# Patient Record
Sex: Male | Born: 1955
Health system: Southern US, Community
[De-identification: ages and names within clinical notes are randomized; demographics above are authoritative.]

## PROBLEM LIST (undated history)

## (undated) DIAGNOSIS — F32A Depression, unspecified: Secondary | ICD-10-CM

## (undated) DIAGNOSIS — Z87442 Personal history of urinary calculi: Secondary | ICD-10-CM

## (undated) DIAGNOSIS — I519 Heart disease, unspecified: Secondary | ICD-10-CM

## (undated) DIAGNOSIS — G250 Essential tremor: Secondary | ICD-10-CM

## (undated) DIAGNOSIS — E119 Type 2 diabetes mellitus without complications: Secondary | ICD-10-CM

## (undated) DIAGNOSIS — I10 Essential (primary) hypertension: Secondary | ICD-10-CM

## (undated) DIAGNOSIS — Z8619 Personal history of other infectious and parasitic diseases: Secondary | ICD-10-CM

## (undated) DIAGNOSIS — G709 Myoneural disorder, unspecified: Secondary | ICD-10-CM

## (undated) DIAGNOSIS — F419 Anxiety disorder, unspecified: Secondary | ICD-10-CM

## (undated) DIAGNOSIS — H359 Unspecified retinal disorder: Secondary | ICD-10-CM

## (undated) DIAGNOSIS — N189 Chronic kidney disease, unspecified: Secondary | ICD-10-CM

## (undated) DIAGNOSIS — H269 Unspecified cataract: Secondary | ICD-10-CM

## (undated) DIAGNOSIS — S83206A Unspecified tear of unspecified meniscus, current injury, right knee, initial encounter: Secondary | ICD-10-CM

## (undated) DIAGNOSIS — K5790 Diverticulosis of intestine, part unspecified, without perforation or abscess without bleeding: Secondary | ICD-10-CM

## (undated) DIAGNOSIS — E785 Hyperlipidemia, unspecified: Secondary | ICD-10-CM

## (undated) DIAGNOSIS — F329 Major depressive disorder, single episode, unspecified: Secondary | ICD-10-CM

## (undated) DIAGNOSIS — K219 Gastro-esophageal reflux disease without esophagitis: Secondary | ICD-10-CM

## (undated) DIAGNOSIS — D649 Anemia, unspecified: Secondary | ICD-10-CM

## (undated) DIAGNOSIS — M1990 Unspecified osteoarthritis, unspecified site: Secondary | ICD-10-CM

## (undated) HISTORY — PX: TONSILLECTOMY: SUR1361

## (undated) HISTORY — PX: KNEE ARTHROSCOPY W/ DEBRIDEMENT: SHX1867

## (undated) HISTORY — DX: Unspecified cataract: H26.9

## (undated) HISTORY — DX: Major depressive disorder, single episode, unspecified: F32.9

## (undated) HISTORY — DX: Myoneural disorder, unspecified: G70.9

## (undated) HISTORY — DX: Depression, unspecified: F32.A

## (undated) HISTORY — DX: Anxiety disorder, unspecified: F41.9

## (undated) HISTORY — DX: Chronic kidney disease, unspecified: N18.9

## (undated) HISTORY — DX: Gastro-esophageal reflux disease without esophagitis: K21.9

## (undated) HISTORY — PX: CARDIAC CATHETERIZATION: SHX172

## (undated) HISTORY — PX: SHOULDER ARTHROSCOPY: SHX128

## (undated) HISTORY — PX: CATARACT EXTRACTION, BILATERAL: SHX1313

## (undated) HISTORY — DX: Anemia, unspecified: D64.9

---

## 1991-12-27 HISTORY — PX: VASECTOMY REVERSAL: SHX243

## 2004-04-17 ENCOUNTER — Encounter: Payer: Self-pay | Admitting: Internal Medicine

## 2004-11-02 ENCOUNTER — Ambulatory Visit: Payer: Self-pay | Admitting: Internal Medicine

## 2004-11-09 ENCOUNTER — Ambulatory Visit: Payer: Self-pay | Admitting: Internal Medicine

## 2005-03-01 ENCOUNTER — Ambulatory Visit: Payer: Self-pay | Admitting: Internal Medicine

## 2005-03-04 ENCOUNTER — Ambulatory Visit: Payer: Self-pay | Admitting: Internal Medicine

## 2005-06-15 ENCOUNTER — Ambulatory Visit: Payer: Self-pay | Admitting: Internal Medicine

## 2006-03-06 ENCOUNTER — Ambulatory Visit: Payer: Self-pay | Admitting: Internal Medicine

## 2006-03-13 ENCOUNTER — Ambulatory Visit: Payer: Self-pay | Admitting: Internal Medicine

## 2006-04-25 ENCOUNTER — Ambulatory Visit (HOSPITAL_COMMUNITY): Admission: RE | Admit: 2006-04-25 | Discharge: 2006-04-25 | Payer: Self-pay | Admitting: Orthopedic Surgery

## 2006-05-08 ENCOUNTER — Ambulatory Visit: Payer: Self-pay | Admitting: Internal Medicine

## 2006-07-12 ENCOUNTER — Ambulatory Visit: Payer: Self-pay | Admitting: Internal Medicine

## 2006-11-09 ENCOUNTER — Ambulatory Visit: Payer: Self-pay | Admitting: Internal Medicine

## 2007-03-07 ENCOUNTER — Ambulatory Visit: Payer: Self-pay | Admitting: Internal Medicine

## 2007-03-07 LAB — CONVERTED CEMR LAB
AST: 30 units/L (ref 0–37)
Albumin: 3.5 g/dL (ref 3.5–5.2)
Basophils Relative: 0.2 % (ref 0.0–1.0)
CO2: 29 meq/L (ref 19–32)
Chloride: 103 meq/L (ref 96–112)
Creatinine, Ser: 1 mg/dL (ref 0.4–1.5)
Creatinine,U: 377.4 mg/dL
Eosinophils Relative: 1.8 % (ref 0.0–5.0)
Glucose, Bld: 140 mg/dL — ABNORMAL HIGH (ref 70–99)
HCT: 40.6 % (ref 39.0–52.0)
Hemoglobin: 13.6 g/dL (ref 13.0–17.0)
Monocytes Absolute: 0.6 10*3/uL (ref 0.2–0.7)
Neutrophils Relative %: 52.7 % (ref 43.0–77.0)
PSA: 0.45 ng/mL (ref 0.10–4.00)
RBC: 4.96 M/uL (ref 4.22–5.81)
RDW: 14 % (ref 11.5–14.6)
Sodium: 141 meq/L (ref 135–145)
Total Bilirubin: 1.3 mg/dL — ABNORMAL HIGH (ref 0.3–1.2)
Total CHOL/HDL Ratio: 5.6
Total Protein: 7.1 g/dL (ref 6.0–8.3)
Triglycerides: 176 mg/dL — ABNORMAL HIGH (ref 0–149)
WBC: 6.5 10*3/uL (ref 4.5–10.5)

## 2007-03-14 ENCOUNTER — Ambulatory Visit: Payer: Self-pay | Admitting: Internal Medicine

## 2007-05-11 DIAGNOSIS — E785 Hyperlipidemia, unspecified: Secondary | ICD-10-CM

## 2007-05-11 DIAGNOSIS — E119 Type 2 diabetes mellitus without complications: Secondary | ICD-10-CM | POA: Insufficient documentation

## 2007-05-11 DIAGNOSIS — E1169 Type 2 diabetes mellitus with other specified complication: Secondary | ICD-10-CM | POA: Insufficient documentation

## 2007-05-16 ENCOUNTER — Ambulatory Visit: Payer: Self-pay | Admitting: Internal Medicine

## 2007-05-16 LAB — CONVERTED CEMR LAB
ALT: 26 units/L (ref 0–40)
AST: 26 units/L (ref 0–37)
Bilirubin, Direct: 0.2 mg/dL (ref 0.0–0.3)
Cholesterol: 113 mg/dL (ref 0–200)
HDL: 30 mg/dL — ABNORMAL LOW (ref >39.0)
LDL Cholesterol: 68 mg/dL (ref 0–99)
Total Protein: 6.7 g/dL (ref 6.0–8.3)
Triglycerides: 77 mg/dL (ref 0–149)

## 2007-05-30 ENCOUNTER — Ambulatory Visit: Payer: Self-pay | Admitting: Internal Medicine

## 2007-08-30 ENCOUNTER — Ambulatory Visit: Payer: Self-pay | Admitting: Internal Medicine

## 2007-08-30 LAB — CONVERTED CEMR LAB
Calcium: 9.5 mg/dL (ref 8.4–10.5)
GFR calc Af Amer: 101 mL/min
GFR calc non Af Amer: 84 mL/min
Glucose, Bld: 111 mg/dL — ABNORMAL HIGH (ref 70–99)
Sodium: 140 meq/L (ref 135–145)

## 2007-09-06 ENCOUNTER — Ambulatory Visit: Payer: Self-pay | Admitting: Internal Medicine

## 2007-09-06 DIAGNOSIS — M1710 Unilateral primary osteoarthritis, unspecified knee: Secondary | ICD-10-CM | POA: Insufficient documentation

## 2007-09-06 DIAGNOSIS — G250 Essential tremor: Secondary | ICD-10-CM | POA: Insufficient documentation

## 2007-09-26 ENCOUNTER — Ambulatory Visit: Payer: Self-pay | Admitting: Gastroenterology

## 2007-10-10 ENCOUNTER — Ambulatory Visit: Payer: Self-pay | Admitting: Gastroenterology

## 2007-10-23 ENCOUNTER — Encounter: Payer: Self-pay | Admitting: Internal Medicine

## 2008-02-06 ENCOUNTER — Telehealth: Payer: Self-pay | Admitting: Internal Medicine

## 2008-03-25 ENCOUNTER — Encounter: Payer: Self-pay | Admitting: Internal Medicine

## 2008-04-09 ENCOUNTER — Ambulatory Visit: Payer: Self-pay | Admitting: Internal Medicine

## 2008-04-09 LAB — CONVERTED CEMR LAB
Albumin: 3.9 g/dL (ref 3.5–5.2)
Alkaline Phosphatase: 74 units/L (ref 39–117)
BUN: 14 mg/dL (ref 6–23)
Calcium: 9.4 mg/dL (ref 8.4–10.5)
Eosinophils Relative: 1.6 % (ref 0.0–5.0)
GFR calc Af Amer: 90 mL/min
Glucose, Bld: 101 mg/dL — ABNORMAL HIGH (ref 70–99)
HCT: 39.9 % (ref 39.0–52.0)
Hemoglobin: 12.9 g/dL — ABNORMAL LOW (ref 13.0–17.0)
Microalb Creat Ratio: 19.4 mg/g (ref 0.0–30.0)
Monocytes Absolute: 0.5 10*3/uL (ref 0.1–1.0)
Monocytes Relative: 7.1 % (ref 3.0–12.0)
Neutro Abs: 4 10*3/uL (ref 1.4–7.7)
Nitrite: NEGATIVE
Potassium: 4.3 meq/L (ref 3.5–5.1)
RBC: 4.85 M/uL (ref 4.22–5.81)
Specific Gravity, Urine: 1.025
Total CHOL/HDL Ratio: 4.3
Total Protein: 7.1 g/dL (ref 6.0–8.3)
Urobilinogen, UA: 0.2
WBC: 6.7 10*3/uL (ref 4.5–10.5)

## 2008-04-10 ENCOUNTER — Encounter: Payer: Self-pay | Admitting: Internal Medicine

## 2008-04-16 ENCOUNTER — Ambulatory Visit: Payer: Self-pay | Admitting: Internal Medicine

## 2008-04-16 DIAGNOSIS — M1990 Unspecified osteoarthritis, unspecified site: Secondary | ICD-10-CM | POA: Insufficient documentation

## 2008-04-24 ENCOUNTER — Ambulatory Visit (HOSPITAL_COMMUNITY): Admission: RE | Admit: 2008-04-24 | Discharge: 2008-04-25 | Payer: Self-pay | Admitting: Orthopedic Surgery

## 2008-04-24 HISTORY — PX: ANTERIOR CERVICAL DECOMP/DISCECTOMY FUSION: SHX1161

## 2008-07-09 ENCOUNTER — Ambulatory Visit: Payer: Self-pay | Admitting: Internal Medicine

## 2008-07-09 LAB — CONVERTED CEMR LAB: Hgb A1c MFr Bld: 6.4 % — ABNORMAL HIGH (ref 4.6–6.0)

## 2008-07-16 ENCOUNTER — Ambulatory Visit: Payer: Self-pay | Admitting: Internal Medicine

## 2008-09-11 ENCOUNTER — Ambulatory Visit: Payer: Self-pay | Admitting: Internal Medicine

## 2008-09-11 LAB — CONVERTED CEMR LAB: Hgb A1c MFr Bld: 6.3 % — ABNORMAL HIGH (ref 4.6–6.0)

## 2008-09-18 ENCOUNTER — Ambulatory Visit: Payer: Self-pay | Admitting: Internal Medicine

## 2008-12-31 ENCOUNTER — Ambulatory Visit (HOSPITAL_BASED_OUTPATIENT_CLINIC_OR_DEPARTMENT_OTHER): Admission: RE | Admit: 2008-12-31 | Discharge: 2008-12-31 | Payer: Self-pay | Admitting: Orthopedic Surgery

## 2009-04-10 ENCOUNTER — Ambulatory Visit: Payer: Self-pay | Admitting: Internal Medicine

## 2009-04-10 LAB — CONVERTED CEMR LAB
Glucose, Urine, Semiquant: NEGATIVE
Ketones, urine, test strip: NEGATIVE
Specific Gravity, Urine: >=1.03
Urobilinogen, UA: 0.2
pH: 5.5

## 2009-04-17 ENCOUNTER — Ambulatory Visit: Payer: Self-pay | Admitting: Internal Medicine

## 2009-04-17 LAB — CONVERTED CEMR LAB
BUN: 15 mg/dL (ref 6–23)
Bilirubin, Direct: 0 mg/dL (ref 0.0–0.3)
CO2: 25 meq/L (ref 19–32)
Chloride: 108 meq/L (ref 96–112)
Cholesterol: 148 mg/dL (ref 0–200)
Creatinine, Ser: 1 mg/dL (ref 0.4–1.5)
Eosinophils Absolute: 0.1 10*3/uL (ref 0.0–0.7)
Glucose, Bld: 117 mg/dL — ABNORMAL HIGH (ref 70–99)
HCT: 36.8 % — ABNORMAL LOW (ref 39.0–52.0)
Hgb A1c MFr Bld: 6.4 % (ref 4.6–6.5)
Lymphs Abs: 2.4 10*3/uL (ref 0.7–4.0)
MCHC: 33.1 g/dL (ref 30.0–36.0)
MCV: 82 fL (ref 78.0–100.0)
Microalb Creat Ratio: 13.1 mg/g (ref 0.0–30.0)
Microalb, Ur: 4.1 mg/dL — ABNORMAL HIGH (ref 0.0–1.9)
Monocytes Absolute: 0.6 10*3/uL (ref 0.1–1.0)
Neutrophils Relative %: 52.1 % (ref 43.0–77.0)
PSA: 0.49 ng/mL (ref 0.10–4.00)
Platelets: 311 10*3/uL (ref 150.0–400.0)
TSH: 1.61 microintl units/mL (ref 0.35–5.50)
Total Bilirubin: 0.8 mg/dL (ref 0.3–1.2)
Total Protein: 6.9 g/dL (ref 6.0–8.3)
Triglycerides: 153 mg/dL — ABNORMAL HIGH (ref 0.0–149.0)

## 2009-05-01 ENCOUNTER — Telehealth: Payer: Self-pay | Admitting: Internal Medicine

## 2009-07-10 ENCOUNTER — Ambulatory Visit: Payer: Self-pay | Admitting: Internal Medicine

## 2009-07-17 ENCOUNTER — Ambulatory Visit: Payer: Self-pay | Admitting: Internal Medicine

## 2009-07-17 DIAGNOSIS — Z8619 Personal history of other infectious and parasitic diseases: Secondary | ICD-10-CM | POA: Insufficient documentation

## 2009-08-24 ENCOUNTER — Encounter: Admission: RE | Admit: 2009-08-24 | Discharge: 2009-08-24 | Payer: Self-pay | Admitting: Internal Medicine

## 2009-08-24 ENCOUNTER — Ambulatory Visit: Payer: Self-pay | Admitting: Internal Medicine

## 2009-08-24 ENCOUNTER — Telehealth: Payer: Self-pay | Admitting: Internal Medicine

## 2009-08-24 DIAGNOSIS — N453 Epididymo-orchitis: Secondary | ICD-10-CM | POA: Insufficient documentation

## 2009-08-24 DIAGNOSIS — N509 Disorder of male genital organs, unspecified: Secondary | ICD-10-CM | POA: Insufficient documentation

## 2010-01-04 ENCOUNTER — Ambulatory Visit: Payer: Self-pay | Admitting: Internal Medicine

## 2010-01-11 ENCOUNTER — Ambulatory Visit: Payer: Self-pay | Admitting: Internal Medicine

## 2010-04-19 ENCOUNTER — Ambulatory Visit: Payer: Self-pay | Admitting: Internal Medicine

## 2010-04-19 LAB — CONVERTED CEMR LAB
ALT: 29 units/L (ref 0–53)
AST: 29 units/L (ref 0–37)
BUN: 15 mg/dL (ref 6–23)
Basophils Absolute: 0 10*3/uL (ref 0.0–0.1)
Bilirubin Urine: NEGATIVE
Bilirubin, Direct: 0 mg/dL (ref 0.0–0.3)
Blood in Urine, dipstick: NEGATIVE
Calcium: 9 mg/dL (ref 8.4–10.5)
Cholesterol: 151 mg/dL (ref 0–200)
Creatinine, Ser: 1.1 mg/dL (ref 0.4–1.5)
Creatinine,U: 181.8 mg/dL
Eosinophils Relative: 2.2 % (ref 0.0–5.0)
GFR calc non Af Amer: 74.09 mL/min (ref >60)
Glucose, Bld: 115 mg/dL — ABNORMAL HIGH (ref 70–99)
Glucose, Urine, Semiquant: NEGATIVE
HCT: 37.7 % — ABNORMAL LOW (ref 39.0–52.0)
HDL: 49.1 mg/dL (ref >39.00)
LDL Cholesterol: 70 mg/dL (ref 0–99)
Lymphocytes Relative: 38.9 % (ref 12.0–46.0)
Lymphs Abs: 2.6 10*3/uL (ref 0.7–4.0)
Microalb Creat Ratio: 38.5 mg/g — ABNORMAL HIGH (ref 0.0–30.0)
Monocytes Relative: 8.1 % (ref 3.0–12.0)
Neutrophils Relative %: 50.3 % (ref 43.0–77.0)
PSA: 0.6 ng/mL (ref 0.10–4.00)
Platelets: 270 10*3/uL (ref 150.0–400.0)
Potassium: 4.4 meq/L (ref 3.5–5.1)
RDW: 16.5 % — ABNORMAL HIGH (ref 11.5–14.6)
TSH: 1.6 microintl units/mL (ref 0.35–5.50)
Total Bilirubin: 0.8 mg/dL (ref 0.3–1.2)
Triglycerides: 158 mg/dL — ABNORMAL HIGH (ref 0.0–149.0)
Urobilinogen, UA: 0.2
VLDL: 31.6 mg/dL (ref 0.0–40.0)
WBC: 6.8 10*3/uL (ref 4.5–10.5)
pH: 5

## 2010-04-26 ENCOUNTER — Ambulatory Visit: Payer: Self-pay | Admitting: Internal Medicine

## 2010-05-20 ENCOUNTER — Ambulatory Visit (HOSPITAL_BASED_OUTPATIENT_CLINIC_OR_DEPARTMENT_OTHER): Admission: RE | Admit: 2010-05-20 | Discharge: 2010-05-20 | Payer: Self-pay | Admitting: Orthopedic Surgery

## 2010-05-20 HISTORY — PX: SHOULDER ARTHROSCOPY WITH ROTATOR CUFF REPAIR AND SUBACROMIAL DECOMPRESSION: SHX5686

## 2010-06-07 ENCOUNTER — Telehealth: Payer: Self-pay | Admitting: Internal Medicine

## 2010-06-07 DIAGNOSIS — R079 Chest pain, unspecified: Secondary | ICD-10-CM | POA: Insufficient documentation

## 2010-06-09 ENCOUNTER — Telehealth (INDEPENDENT_AMBULATORY_CARE_PROVIDER_SITE_OTHER): Payer: Self-pay | Admitting: *Deleted

## 2010-06-09 ENCOUNTER — Encounter: Payer: Self-pay | Admitting: Internal Medicine

## 2010-06-10 ENCOUNTER — Encounter: Payer: Self-pay | Admitting: Cardiovascular Disease

## 2010-06-10 ENCOUNTER — Encounter (HOSPITAL_COMMUNITY): Admission: RE | Admit: 2010-06-10 | Discharge: 2010-07-27 | Payer: Self-pay | Admitting: Internal Medicine

## 2010-06-10 ENCOUNTER — Ambulatory Visit: Payer: Self-pay | Admitting: Cardiovascular Disease

## 2010-06-10 ENCOUNTER — Ambulatory Visit: Payer: Self-pay

## 2010-06-10 HISTORY — PX: CARDIOVASCULAR STRESS TEST: SHX262

## 2010-06-15 ENCOUNTER — Ambulatory Visit: Payer: Self-pay | Admitting: Cardiology

## 2010-06-16 ENCOUNTER — Ambulatory Visit: Payer: Self-pay | Admitting: Cardiology

## 2010-06-17 ENCOUNTER — Telehealth: Payer: Self-pay | Admitting: Cardiology

## 2010-06-18 ENCOUNTER — Encounter: Payer: Self-pay | Admitting: Cardiology

## 2010-06-20 LAB — CONVERTED CEMR LAB
BUN: 17 mg/dL (ref 6–23)
Basophils Relative: 0.2 % (ref 0.0–3.0)
Creatinine, Ser: 1.1 mg/dL (ref 0.4–1.5)
Eosinophils Relative: 12.1 % — ABNORMAL HIGH (ref 0.0–5.0)
GFR calc non Af Amer: 75.63 mL/min (ref >60)
HCT: 42.4 % (ref 39.0–52.0)
INR: 1 (ref 0.8–1.0)
Monocytes Relative: 9.5 % (ref 3.0–12.0)
Neutrophils Relative %: 44.9 % (ref 43.0–77.0)
Platelets: 256 10*3/uL (ref 150.0–400.0)
Potassium: 4.9 meq/L (ref 3.5–5.1)
Prothrombin Time: 10.6 s (ref 9.7–11.8)
RBC: 4.99 M/uL (ref 4.22–5.81)
WBC: 8.5 10*3/uL (ref 4.5–10.5)

## 2010-06-21 ENCOUNTER — Inpatient Hospital Stay (HOSPITAL_BASED_OUTPATIENT_CLINIC_OR_DEPARTMENT_OTHER): Admission: RE | Admit: 2010-06-21 | Discharge: 2010-06-21 | Payer: Self-pay | Admitting: Cardiology

## 2010-06-21 ENCOUNTER — Ambulatory Visit: Payer: Self-pay | Admitting: Cardiology

## 2010-07-02 ENCOUNTER — Telehealth: Payer: Self-pay | Admitting: Internal Medicine

## 2010-07-06 ENCOUNTER — Ambulatory Visit: Payer: Self-pay | Admitting: Cardiology

## 2010-07-06 DIAGNOSIS — I5189 Other ill-defined heart diseases: Secondary | ICD-10-CM | POA: Insufficient documentation

## 2010-07-06 DIAGNOSIS — R0602 Shortness of breath: Secondary | ICD-10-CM | POA: Insufficient documentation

## 2010-07-21 ENCOUNTER — Ambulatory Visit: Payer: Self-pay | Admitting: Cardiology

## 2010-07-21 ENCOUNTER — Ambulatory Visit: Payer: Self-pay | Admitting: Internal Medicine

## 2010-07-21 ENCOUNTER — Ambulatory Visit (HOSPITAL_COMMUNITY): Admission: RE | Admit: 2010-07-21 | Discharge: 2010-07-21 | Payer: Self-pay | Admitting: Cardiology

## 2010-07-21 ENCOUNTER — Ambulatory Visit: Payer: Self-pay

## 2010-07-21 ENCOUNTER — Encounter: Payer: Self-pay | Admitting: Cardiology

## 2010-07-21 HISTORY — PX: TRANSTHORACIC ECHOCARDIOGRAM: SHX275

## 2010-07-21 LAB — CONVERTED CEMR LAB
CO2: 29 meq/L (ref 19–32)
Chloride: 102 meq/L (ref 96–112)
Hgb A1c MFr Bld: 6.2 % (ref 4.6–6.5)
Potassium: 4.3 meq/L (ref 3.5–5.1)

## 2010-07-26 ENCOUNTER — Telehealth: Payer: Self-pay | Admitting: Cardiology

## 2010-07-28 ENCOUNTER — Ambulatory Visit: Payer: Self-pay | Admitting: Internal Medicine

## 2010-08-10 ENCOUNTER — Telehealth: Payer: Self-pay | Admitting: Internal Medicine

## 2010-08-26 ENCOUNTER — Ambulatory Visit: Payer: Self-pay | Admitting: Internal Medicine

## 2010-11-26 ENCOUNTER — Ambulatory Visit: Payer: Self-pay | Admitting: Internal Medicine

## 2010-11-26 LAB — CONVERTED CEMR LAB
CO2: 30 meq/L (ref 19–32)
Calcium: 9.8 mg/dL (ref 8.4–10.5)
Chloride: 102 meq/L (ref 96–112)
Potassium: 5.1 meq/L (ref 3.5–5.1)
Sodium: 141 meq/L (ref 135–145)

## 2011-01-27 NOTE — Assessment & Plan Note (Signed)
Summary: 3 month fup//ccm   Vital Signs:  Patient profile:   55 year old male Height:      70 inches Weight:      204 pounds BMI:     29.38 Temp:     98.3 degrees F oral Pulse rate:   72 / minute Resp:     14 per minute BP sitting:   120 / 80  (left arm)  Vitals Entered By: Willy Eddy, LPN (November 26, 2010 9:20 AM) CC: f/u pramipexole-could only take 1-helped tremors but caused  increased urination with foul smell and darker colored,small intestine cramps,constipation,night seats, and myalgia Is Patient Diabetic? Yes Did you bring your meter with you today? No   Primary Care Provider:  Stacie Glaze MD  CC:  f/u pramipexole-could only take 1-helped tremors but caused  increased urination with foul smell and darker colored, small intestine cramps, constipation, night seats, and and myalgia.  History of Present Illness: he could not tolerate  the parkinsons type he had extrapyramidal side effect form these medicatons the valium works best  Press photographer & Management  Alcohol-Tobacco     Smoking Status: quit  Current Problems (verified): 1)  Dyspnea  (ICD-786.05) 2)  Diastolic Dysfunction  (ICD-429.9) 3)  Chest Pain  (ICD-786.50) 4)  Epididymitis, Right  (ICD-604.90) 5)  Testicular Pain  (ICD-608.9) 6)  Herpes Zoster  (ICD-053.9) 7)  Osteoarthritis  (ICD-715.90) 8)  Preventive Health Care  (ICD-V70.0) 9)  Tremor, Essential  (ICD-333.1) 10)  Osteoarthrosis, Local Nos, Lower Leg  (ICD-715.36) 11)  Diabetes Mellitus, Type II  (ICD-250.00) 12)  Hyperlipidemia  (ICD-272.4)  Current Medications (verified): 1)  Cymbalta 60 Mg Cpep (Duloxetine Hcl) .... Take 1 Capsule By Mouth Once A Day 2)  Lipitor 10 Mg  Tabs (Atorvastatin Calcium) .... Once Daily 3)  Glucophage Xr 500 Mg Xr24h-Tab (Metformin Hcl) .... 2 Once Daily 4)  Valium 5 Mg Tabs (Diazepam) .... 1/2 Every 8 Hours As Needed 5)  Benazepril Hcl 5 Mg Tabs (Benazepril Hcl) .... 1/2 By Mouth  Daily With Goal To Titrate To One A Day 6)  Aspirin 81 Mg Tabs (Aspirin) .... Take One Tablet Once Daily 7)  Pramipexole Dihydrochloride 0.25 Mg Tabs (Pramipexole Dihydrochloride) .... One By Mouth Daily For 2 Weeks Then Two Times A Day  Allergies (verified): 1)  Celebrex (Celecoxib) 2)  Clindamycin Hcl (Clindamycin Hcl) 3)  Codeine Sulfate (Codeine Sulfate) 4)  Erythromycin (Erythromycin)  Past History:  Family History: Last updated: 06/15/2010 HTN, hyperlipidemia, diabetes.   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  Past Medical History: Reviewed history from 07/06/2010 and no changes required. 1. Hyperlipidemia 2. Diabetes mellitus, type II x 14 years 3. Osteoarthritis 4. Rotator cuff tear left shoulder s/p arthroscopic surgery 5/11 5. Depression 6. Essential tremor on propranolol 7. DIabetic nephropathy (proteinuria) 8. Chest pain: ETT-myoview (6/11): 5'33", nonspecific ST-T changes, chest pain + severe dyspnea, hypotensive BP response, EF 61%, normal perfusion images.  Left heart cath (radial approach) 6/11 with minimal coronary disease (25% mid RCA stenosis) and LVEDP 23 mmHg.    Past Surgical History: Reviewed history from 04/16/2008 and no changes required. knee surgery  athroscopic Rotator cuff repair  Cervical fusion  (planned 04/2008)  Family History: Reviewed history from 06/15/2010 and no changes required. HTN, hyperlipidemia, diabetes.   No known premature CAD  Social History: Reviewed history from 06/15/2010 and no changes required. 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    Physical Exam  General:  Well-developed,well-nourished,in no acute distress; alert,appropriate and cooperative throughout examination Head:  normocephalic.  atraumatic.   Eyes:  pupils equal and pupils round.   Nose:  no external deformity and no nasal discharge.   Mouth:  pharynx pink and moist.  no erythema.   Neck:  supple and full ROM.   Lungs:  normal respiratory effort.  no wheezes.   Heart:  normal rate and regular rhythm.  no murmur.   Abdomen:  soft and non-tender.  no distention.   Msk:  joint tenderness and joint swelling.  leftr shoulder   Impression & Recommendations:  Problem # 1:  TREMOR, ESSENTIAL (ICD-333.1) has failed parkinsons class  drug ( mirapax) BB caused fatique valium works best  Problem # 2:  DIASTOLIC DYSFUNCTION (ICD-429.9) staBLE On ace  Problem # 3:  DIABETES MELLITUS, TYPE II (ICD-250.00)  due Aic His updated medication list for this problem includes:    Glucophage Xr 500 Mg Xr24h-tab (Metformin hcl) .Marland Kitchen... 2 once daily    Benazepril Hcl 5 Mg Tabs (Benazepril hcl) .Marland Kitchen... 1/2 by mouth daily with goal to titrate to one a day    Aspirin 81 Mg Tabs (Aspirin) .Marland Kitchen... Take one tablet once daily  Labs Reviewed: Creat: 1.1 (07/21/2010)    Reviewed HgBA1c results: 6.2 (07/21/2010)  6.2 (04/19/2010)  Orders: Fingerstick (04540) TLB-BMP (Basic Metabolic Panel-BMET) (80048-METABOL) TLB-A1C / Hgb A1C  (Glycohemoglobin) (83036-A1C)  Complete Medication List: 1)  Cymbalta 60 Mg Cpep (Duloxetine hcl) .... Take 1 capsule by mouth once a day 2)  Lipitor 10 Mg Tabs (Atorvastatin calcium) .... Once daily 3)  Glucophage Xr 500 Mg Xr24h-tab (Metformin hcl) .... 2 once daily 4)  Valium 5 Mg Tabs (Diazepam) .... 1/2 every 8 hours as needed 5)  Benazepril Hcl 5 Mg Tabs (Benazepril hcl) .... 1/2 by mouth daily with goal to titrate to one a day 6)  Aspirin 81 Mg Tabs (Aspirin) .... Take one tablet once daily  Patient Instructions: 1)  Please schedule a follow-up appointment in 6 months. 2)  CPX   Orders Added: 1)  Fingerstick [36416] 2)  Est. Patient Level IV [98119] 3)  TLB-BMP (Basic Metabolic Panel-BMET) [80048-METABOL] 4)  TLB-A1C / Hgb A1C (Glycohemoglobin) [83036-A1C]

## 2011-01-27 NOTE — Progress Notes (Signed)
Summary: Nuclear Pre-Procedure  Phone Note Outgoing Call Call back at [facility] Phone [phone]   Call placed by: Stanton Kidney, EMT-P,  June 09, 2010 3:20 PM Action Taken: Phone Call Completed Summary of Call: Left message with information on Myoview Information Sheet (see scanned document for details).     Nuclear Med Background Indications for Stress Test: Evaluation for Ischemia     Symptoms: Chest Pain with Exertion, Dizziness, Nausea    Nuclear Pre-Procedure Cardiac Risk Factors: History of Smoking, Lipids, NIDDM Height (in): 70

## 2011-01-27 NOTE — Assessment & Plan Note (Signed)
Summary: 1 MONTH ROV/NJR   Vital Signs:  Patient profile:   55 year old male Height:      70 inches Weight:      194 pounds BMI:     27.94 Temp:     98.2 degrees F oral Pulse rate:   76 / minute Pulse rhythm:   regular Resp:     14 per minute BP sitting:   126 / 80  (left arm)  Vitals Entered By: Willy Eddy, LPN (August 26, 2010 10:43 AM) CC: roa bp check- had to stop diltiazem due to fatigue- feeling better after stopping Is Patient Diabetic? Yes Did you bring your meter with you today? No   Primary Care Provider:  Stacie Glaze MD  CC:  roa bp check- had to stop diltiazem due to fatigue- feeling better after stopping.  History of Present Illness: has been of the diltiazem for diastollic dysfunction the pt had extreme fatigue he took blood pressure twice daily with the blood pressure cuff provided still has some SOB but this has improved with increased cardiaopulmonary resolve the tremors have resumed off the BB ( propranol) his blood pressures are very good but the pulses are still in the 90-100 range off the propranolol and the diltiazem esential tremor  Preventive Screening-Counseling & Management  Alcohol-Tobacco     Smoking Status: quit  Current Problems (verified): 1)  Dyspnea  (ICD-786.05) 2)  Diastolic Dysfunction  (ICD-429.9) 3)  Chest Pain  (ICD-786.50) 4)  Epididymitis, Right  (ICD-604.90) 5)  Testicular Pain  (ICD-608.9) 6)  Herpes Zoster  (ICD-053.9) 7)  Osteoarthritis  (ICD-715.90) 8)  Preventive Health Care  (ICD-V70.0) 9)  Tremor, Essential  (ICD-333.1) 10)  Osteoarthrosis, Local Nos, Lower Leg  (ICD-715.36) 11)  Diabetes Mellitus, Type II  (ICD-250.00) 12)  Hyperlipidemia  (ICD-272.4)  Current Medications (verified): 1)  Cymbalta 60 Mg Cpep (Duloxetine Hcl) .... Take 1 Capsule By Mouth Once A Day 2)  Lipitor 10 Mg  Tabs (Atorvastatin Calcium) .... Once Daily 3)  Glucophage Xr 500 Mg Xr24h-Tab (Metformin Hcl) .... 2 Once Daily 4)   Valium 5 Mg Tabs (Diazepam) .... 1/2 Every 8 Hours As Needed 5)  Benazepril Hcl 5 Mg Tabs (Benazepril Hcl) .... 1/2 By Mouth Daily With Goal To Titrate To One A Day 6)  Aspirin 81 Mg Tabs (Aspirin) .... Take One Tablet Once Daily  Allergies (verified): 1)  Celebrex (Celecoxib) 2)  Clindamycin Hcl (Clindamycin Hcl) 3)  Codeine Sulfate (Codeine Sulfate) 4)  Erythromycin (Erythromycin)  Past History:  Family History: Last updated: 06/15/2010 HTN, hyperlipidemia, diabetes.   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  Past Medical History: Reviewed history from 07/06/2010 and no changes required. 1. Hyperlipidemia 2. Diabetes mellitus, type II x 14 years 3. Osteoarthritis 4. Rotator cuff tear left shoulder s/p arthroscopic surgery 5/11 5. Depression 6. Essential tremor on propranolol 7. DIabetic nephropathy (proteinuria) 8. Chest pain: ETT-myoview (6/11): 5'33", nonspecific ST-T changes, chest pain + severe dyspnea, hypotensive BP response, EF 61%, normal perfusion images.  Left heart cath (radial approach) 6/11 with minimal coronary disease (25% mid RCA stenosis) and LVEDP 23 mmHg.    Past Surgical History: Reviewed history from  04/16/2008 and no changes required. knee surgery  athroscopic Rotator cuff repair Cervical fusion  (planned 04/2008)  Family History: Reviewed history from 06/15/2010 and no changes required. HTN, hyperlipidemia, diabetes.   No known premature CAD  Social History: Reviewed history from 06/15/2010 and no changes required. 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         lot [id number].Exp 06/24/2008 [employer] pasteur-left deltoid IM.0.42ml   Physical Exam  General:  Well-developed,well-nourished,in no acute distress; alert,appropriate and cooperative throughout examination Head:  normocephalic.  atraumatic.   Eyes:  pupils equal and pupils round.   Ears:  R ear normal and L ear normal.   Nose:  no external deformity and no nasal discharge.   Mouth:  pharynx pink and moist.  no erythema.   Neck:  supple and full ROM.   Lungs:  normal respiratory effort.  no wheezes.   Heart:  normal rate and regular rhythm.  no murmur.   Abdomen:  soft and non-tender.  no distention.   Msk:  joint tenderness and joint swelling.  leftr shoulder Pulses:  R and L carotid,radial,femoral,dorsalis pedis and posterior tibial pulses are full and equal bilaterally Extremities:  trace left pedal edema and trace right pedal edema.   Neurologic:  alert & oriented X3 and gait normal.     Impression & Recommendations:  Problem # 1:  DIASTOLIC DYSFUNCTION (ICD-429.9) cannot tolerate the  CCB off the propranolon the blood pressure has recovered but there was a temporary increase in pulse  Problem # 2:  DIABETES MELLITUS, TYPE II (ICD-250.00)  His updated medication list for this problem includes:    Glucophage Xr 500 Mg Xr24h-tab (Metformin hcl) .Marland Kitchen... 2 once daily    Benazepril Hcl 5 Mg Tabs (Benazepril hcl) .Marland Kitchen... 1/2 by mouth daily with goal to titrate  to one a day    Aspirin 81 Mg Tabs (Aspirin) .Marland Kitchen... Take one tablet once daily  Labs Reviewed: Creat: 1.1 (07/21/2010)    Reviewed HgBA1c results: 6.2 (07/21/2010)  6.2 (04/19/2010)  Problem # 3:  DIABETES MELLITUS, TYPE II (ICD-250.00)  on ace due to proteinurea His updated medication list for this problem includes:    Glucophage Xr 500 Mg Xr24h-tab (Metformin hcl) .Marland Kitchen... 2 once daily    Benazepril Hcl 5 Mg Tabs (Benazepril hcl) .Marland Kitchen... 1/2 by mouth daily with goal to titrate to one a day    Aspirin 81 Mg Tabs (Aspirin) .Marland Kitchen... Take one tablet once daily  Labs Reviewed: Creat: 1.1 (07/21/2010)    Reviewed HgBA1c results: 6.2 (07/21/2010)  6.2 (04/19/2010)  Problem # 4:  TREMOR, ESSENTIAL (ICD-333.1)  Complete Medication List: 1)  Cymbalta 60 Mg Cpep (Duloxetine hcl) .... Take 1 capsule by mouth once a day 2)  Lipitor 10 Mg Tabs (Atorvastatin calcium) .... Once daily 3)  Glucophage Xr 500 Mg Xr24h-tab (Metformin hcl) .... 2 once daily 4)  Valium 5 Mg Tabs (Diazepam) .... 1/2 every 8 hours as needed 5)  Benazepril Hcl 5 Mg Tabs (Benazepril hcl) .... 1/2 by mouth daily with goal to titrate to one a day  6)  Aspirin 81 Mg Tabs (Aspirin) .... Take one tablet once daily 7)  Pramipexole Dihydrochloride 0.25 Mg Tabs (Pramipexole dihydrochloride) .... One by mouth daily for 2 weeks then two times a day  Patient Instructions: 1)  Please schedule a follow-up appointment in 3 months. Prescriptions: PRAMIPEXOLE DIHYDROCHLORIDE 0.25 MG TABS (PRAMIPEXOLE DIHYDROCHLORIDE) one by mouth daily for 2 weeks then two times a day  #60 x 3   Entered and Authorized by:   Stacie Glaze MD   Signed by:   Stacie Glaze MD on 08/26/2010   Method used:   Electronically to        [facility] [phone][facility] (retail)       [address]       [place], [place]  [address]       Ph: [phone] or [phone]       Fax: [phone]   RxID:   [id number]    Flu Vaccine Consent Questions:    Do you have a history  of severe allergic reactions to this vaccine? no    Any prior history of allergic reactions to egg and/or gelatin? no    Do you have a sensitivity to the preservative Thimersol? no    Do you have a past history of Guillan-Barre Syndrome? no    Do you currently have an acute febrile illness? no    Have you ever had a severe reaction to latex? no    Vaccine information given and explained to patient? yes

## 2011-01-27 NOTE — Assessment & Plan Note (Signed)
Summary: 3 mo rov/mm   Vital Signs:  Patient profile:   55 year old male Height:      70 inches Weight:      194 pounds BMI:     27.94 Temp:     98.2 degrees F oral Pulse rate:   80 / minute Resp:     14 per minute BP sitting:   110 / 70  (left arm)  Vitals Entered By: Willy Eddy, LPN (July 28, 2010 8:09 AM) CC: roa labs  Is Patient Diabetic? Yes Did you bring your meter with you today? No   Primary Care Provider:  Stacie Glaze MD  CC:  roa labs .  History of Present Illness: low exercize tolerance and echo with dyastollic dysfunction no increased pulmonary pressures seen DM is in moderate control weight stable has gained muslce mass   Preventive Screening-Counseling & Management  Alcohol-Tobacco     Smoking Status: quit  Problems Prior to Update: 1)  Dyspnea  (ICD-786.05) 2)  Diastolic Dysfunction  (ICD-429.9) 3)  Chest Pain  (ICD-786.50) 4)  Epididymitis, Right  (ICD-604.90) 5)  Testicular Pain  (ICD-608.9) 6)  Herpes Zoster  (ICD-053.9) 7)  Osteoarthritis  (ICD-715.90) 8)  Preventive Health Care  (ICD-V70.0) 9)  Tremor, Essential  (ICD-333.1) 10)  Osteoarthrosis, Local Nos, Lower Leg  (ICD-715.36) 11)  Diabetes Mellitus, Type II  (ICD-250.00) 12)  Hyperlipidemia  (ICD-272.4)  Current Problems (verified): 1)  Dyspnea  (ICD-786.05) 2)  Diastolic Dysfunction  (ICD-429.9) 3)  Chest Pain  (ICD-786.50) 4)  Epididymitis, Right  (ICD-604.90) 5)  Testicular Pain  (ICD-608.9) 6)  Herpes Zoster  (ICD-053.9) 7)  Osteoarthritis  (ICD-715.90) 8)  Preventive Health Care  (ICD-V70.0) 9)  Tremor, Essential  (ICD-333.1) 10)  Osteoarthrosis, Local Nos, Lower Leg  (ICD-715.36) 11)  Diabetes Mellitus, Type II  (ICD-250.00) 12)  Hyperlipidemia  (ICD-272.4)  Medications Prior to Update: 1)  Cymbalta 60 Mg Cpep (Duloxetine Hcl) .... Take 1 Capsule By Mouth Once A Day 2)  Lipitor 10 Mg  Tabs (Atorvastatin Calcium) .... Once Daily 3)  Propranolol Hcl 10 Mg Tabs  (Propranolol Hcl) .Marland Kitchen.. 1 Once Daily 4)  Glucophage Xr 500 Mg Xr24h-Tab (Metformin Hcl) .... 2 Once Daily 5)  Valium 5 Mg Tabs (Diazepam) .... 1/2 Every 8 Hours As Needed 6)  Benazepril Hcl 10 Mg Tabs (Benazepril Hcl) .... 1/4 Tablet Once Daily 7)  Robaxin 500 Mg Tabs (Methocarbamol) .... As Needed 8)  Aspirin 81 Mg Tabs (Aspirin) .... Take One Tablet Once Daily  Current Medications (verified): 1)  Cymbalta 60 Mg Cpep (Duloxetine Hcl) .... Take 1 Capsule By Mouth Once A Day 2)  Lipitor 10 Mg  Tabs (Atorvastatin Calcium) .... Once Daily 3)  Diltiazem Hcl Coated Beads 180 Mg Xr24h-Cap (Diltiazem Hcl Coated Beads) .... One By Mouth Daily At Bedtime 4)  Glucophage Xr 500 Mg Xr24h-Tab (Metformin Hcl) .... 2 Once Daily 5)  Valium 5 Mg Tabs (Diazepam) .... 1/2 Every 8 Hours As Needed 6)  Benazepril Hcl 5 Mg Tabs (Benazepril Hcl) .... 1/2 By Mouth Daily With Goal To Titrate To One A Day 7)  Aspirin 81 Mg Tabs (Aspirin) .... Take One Tablet Once Daily  Allergies (verified): 1)  Celebrex (Celecoxib) 2)  Clindamycin Hcl (Clindamycin Hcl) 3)  Codeine Sulfate (Codeine Sulfate) 4)  Erythromycin (Erythromycin)  Past History:  Family History: Last updated: 06/15/2010 HTN, hyperlipidemia, diabetes.   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  Past Medical History: Reviewed history from 07/06/2010 and no changes required. 1. Hyperlipidemia 2. Diabetes mellitus, type II x 14 years 3. Osteoarthritis 4. Rotator cuff tear left shoulder s/p arthroscopic surgery 5/11 5. Depression 6. Essential tremor on propranolol 7. DIabetic nephropathy (proteinuria) 8.  Chest pain: ETT-myoview (6/11): 5'33", nonspecific ST-T changes, chest pain + severe dyspnea, hypotensive BP response, EF 61%, normal perfusion images.  Left heart cath (radial approach) 6/11 with minimal coronary disease (25% mid RCA stenosis) and LVEDP 23 mmHg.    Past Surgical History: Reviewed history from 04/16/2008 and no changes required. knee surgery  athroscopic Rotator cuff repair Cervical fusion  (planned 04/2008)  Family History: Reviewed history from 06/15/2010 and no changes required. HTN, hyperlipidemia, diabetes.   No known premature CAD  Social History: Reviewed history from 06/15/2010 and no changes required. 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    Physical Exam  General:  Well-developed,well-nourished,in no acute distress; alert,appropriate and cooperative throughout examination Head:  normocephalic.  atraumatic.   Eyes:  pupils equal and pupils round.   Ears:  R ear normal and L ear normal.   Nose:  no external deformity and no nasal discharge.   Neck:  supple and full ROM.   Lungs:  normal respiratory effort.  no wheezes.   Heart:  normal rate and regular rhythm.  no murmur.   Abdomen:  soft and non-tender.  no distention.   Msk:  joint tenderness and joint swelling.  leftr shoulder Extremities:  trace left pedal edema and trace right pedal edema.   Neurologic:  alert & oriented X3 and gait  normal.     Impression & Recommendations:  Problem # 1:  DIASTOLIC DYSFUNCTION (ICD-429.9) Assessment Unchanged echo results reviewed, cath and chest pain work up reviewed has class two and would recommend a BB for both rate control and end dystolli relaxation will change for propranolol  ( on this for the tremors) to CCB and stay on ace for proteinuria and DM  Problem # 2:  DYSPNEA (ICD-786.05) Assessment: Unchanged persistant possible related to above gated protocol  the pt has a work up for chest pain with cath and stress testing  Problem # 3:  DIABETES MELLITUS, TYPE II (ICD-250.00) Assessment: Unchanged  His updated medication list for this problem includes:    Glucophage Xr 500 Mg Xr24h-tab (Metformin hcl) .Marland Kitchen... 2 once daily    Benazepril Hcl 5 Mg Tabs (Benazepril hcl) .Marland Kitchen... 1/2 by mouth daily with goal to titrate to one a day    Aspirin 81 Mg Tabs (Aspirin) .Marland Kitchen... Take one tablet once daily  Labs Reviewed: Creat: 1.1 (07/21/2010)    Reviewed HgBA1c results: 6.2 (07/21/2010)  6.2 (04/19/2010)  Complete Medication List: 1)  Cymbalta 60 Mg Cpep (Duloxetine hcl) .... Take 1 capsule by mouth once a day  2)  Lipitor 10 Mg Tabs (Atorvastatin calcium) .... Once daily 3)  Diltiazem Hcl Coated Beads 180 Mg Xr24h-cap (Diltiazem hcl coated beads) .... One by mouth daily at bedtime 4)  Glucophage Xr 500 Mg Xr24h-tab (Metformin hcl) .... 2 once daily 5)  Valium 5 Mg Tabs (Diazepam) .... 1/2 every 8 hours as needed 6)  Benazepril Hcl 5 Mg Tabs (Benazepril hcl) .... 1/2 by mouth daily with goal to titrate to one a day 7)  Aspirin 81 Mg Tabs (Aspirin) .... Take one tablet once daily  Patient Instructions: 1)  note the medicaton changes 2)  Please schedule a follow-up appointment in 1 month. Prescriptions: BENAZEPRIL HCL 5 MG TABS (BENAZEPRIL HCL) 1/2 by mouth daily with goal to titrate to one a day  #30 x 4   Entered and Authorized by:   Stacie Glaze MD   Signed by:   Stacie Glaze MD on 07/28/2010   Method used:   [facility] to        [facility] [phone][facility] (retail)       [address]       [place], [place]  [address]       Ph: [phone] or [phone]       Fax: [phone]   RxID:   [id number] DILTIAZEM HCL COATED BEADS 180 MG XR24H-CAP (DILTIAZEM HCL COATED BEADS) one by mouth daily at bedtime  #30 x 2   Entered and Authorized by:   Stacie Glaze MD   Signed by:   Stacie Glaze MD on 07/28/2010   Method used:   [facility]ron[facility] to        [facility] [phone]* (retail)       [address]       [place], [place]  [address]       Ph: [phone] or [phone]       Fax: [phone]   RxID:   [id number]

## 2011-01-27 NOTE — Assessment & Plan Note (Signed)
Summary: 6 MONTH ROV/NJR   Vital Signs:  Patient profile:   55 year old male Height:      70 inches Weight:      185 pounds BMI:     26.64 Temp:     98.2 degrees F oral Pulse rate:   72 / minute Resp:     14 per minute BP sitting:   130 / 80  (left arm)  Vitals Entered By: Willy Eddy, LPN (January 11, 2010 8:18 AM) CC: roa labs, Type 2 diabetes mellitus follow-up   CC:  roa labs and Type 2 diabetes mellitus follow-up.  History of Present Illness: follow up DM  Type 2 Diabetes Mellitus Follow-Up      This is a 55 year old man who presents for Type 2 diabetes mellitus follow-up.  The patient denies polyuria, polydipsia, blurred vision, self managed hypoglycemia, hypoglycemia requiring help, weight loss, weight gain, and numbness of extremities.  The patient denies the following symptoms: neuropathic pain, chest pain, vomiting, orthostatic symptoms, poor wound healing, intermittent claudication, vision loss, and foot ulcer.  Since the last visit the patient reports good dietary compliance.  The patient has been measuring capillary blood glucose before breakfast.  Since the last visit, the patient reports having had eye care by an ophthalmologist.    Preventive Screening-Counseling & Management  Alcohol-Tobacco     Smoking Status: quit  Problems Prior to Update: 1)  Epididymitis, Right  (ICD-604.90) 2)  Testicular Pain  (ICD-608.9) 3)  Herpes Zoster  (ICD-053.9) 4)  Osteoarthritis  (ICD-715.90) 5)  Preventive Health Care  (ICD-V70.0) 6)  Tremor, Essential  (ICD-333.1) 7)  Osteoarthrosis, Local Nos, Lower Leg  (ICD-715.36) 8)  Diabetes Mellitus, Type II  (ICD-250.00) 9)  Hyperlipidemia  (ICD-272.4)  Medications Prior to Update: 1)  Cymbalta 60 Mg Cpep (Duloxetine Hcl) .... Take 1 Capsule By Mouth Once A Day 2)  Lipitor 10 Mg  Tabs (Atorvastatin Calcium) .... Once Daily 3)  Propranolol Hcl 10 Mg Tabs (Propranolol Hcl) .Marland Kitchen.. 1 Once Daily 4)  Glucophage Xr 500 Mg Xr24h-Tab  (Metformin Hcl) .... 2 Once Daily 5)  Acyclovir 800 Mg Tabs (Acyclovir) .... One By Mouth Three Times A Day For 7 Days 6)  Naproxen 500 Mg Tabs (Naproxen) .... One By Mouth Two Times A Day For 10 Days 7)  Cephalexin 500 Mg Caps (Cephalexin) .... One By Mouth Qid With Food 8)  Valium 5 Mg Tabs (Diazepam) .... 1/2 Every 8 Hours Prn  Current Medications (verified): 1)  Cymbalta 60 Mg Cpep (Duloxetine Hcl) .... Take 1 Capsule By Mouth Once A Day 2)  Lipitor 10 Mg  Tabs (Atorvastatin Calcium) .... Once Daily 3)  Propranolol Hcl 10 Mg Tabs (Propranolol Hcl) .Marland Kitchen.. 1 Once Daily 4)  Glucophage Xr 500 Mg Xr24h-Tab (Metformin Hcl) .... 2 Once Daily 5)  Naproxen 500 Mg Tabs (Naproxen) .... One By Mouth Two Times A Day For 10 Days 6)  Valium 5 Mg Tabs (Diazepam) .... 1/2 Every 8 Hours Prn  Allergies (verified): 1)  Celebrex (Celecoxib) 2)  Clindamycin Hcl (Clindamycin Hcl) 3)  Codeine Sulfate (Codeine Sulfate) 4)  Erythromycin (Erythromycin)  Past History:  Family History: Last updated: 09/06/2007 Family History High cholesterol Family History Hypertension  Social History: Last updated: 09/06/2007 Married Former Smoker  Risk Factors: Exercise: yes (09/06/2007)  Risk Factors: Smoking Status: quit (01/11/2010)  Past medical, surgical, family and social histories (including risk factors) reviewed, and no changes noted (except as noted below).  Past Medical  History: Reviewed history from 04/16/2008 and no changes required. Hyperlipidemia Diabetes mellitus, type II Osteoarthritis  Past Surgical History: Reviewed history from 04/16/2008 and no changes required. knee surgery  athroscopic Rotator cuff repair Cervical fusion  (planned 04/2008)  Family History: Reviewed history from 09/06/2007 and no changes required. Family History High cholesterol Family History Hypertension  Social History: Reviewed history from 09/06/2007 and no changes required. Married Former Smoker  Review  of Systems  The patient denies anorexia, fever, weight loss, weight gain, vision loss, decreased hearing, hoarseness, chest pain, syncope, dyspnea on exertion, peripheral edema, prolonged cough, headaches, hemoptysis, abdominal pain, melena, hematochezia, severe indigestion/heartburn, hematuria, incontinence, genital sores, muscle weakness, suspicious skin lesions, transient blindness, difficulty walking, depression, unusual weight change, abnormal bleeding, enlarged lymph nodes, angioedema, and breast masses.    Physical Exam  General:  Well-developed,well-nourished,in no acute distress; alert,appropriate and cooperative throughout examination Head:  normocephalic.  atraumatic.   Ears:  R ear normal and L ear normal.   Mouth:  pharynx pink and moist.  no erythema.   Neck:  supple and full ROM.   Lungs:  normal respiratory effort.  no wheezes.   Heart:  normal rate and regular rhythm.  no murmur.   Abdomen:  soft and non-tender.  no distention.   Pulses:  R and L carotid,radial,femoral,dorsalis pedis and posterior tibial pulses are full and equal bilaterally Extremities:  trace left pedal edema and trace right pedal edema.   Neurologic:  alert & oriented X3 and gait normal.     Impression & Recommendations:  Problem # 1:  DIABETES MELLITUS, TYPE II (ICD-250.00) syndrome x with stable CBG's weight loss His updated medication list for this problem includes:    Glucophage Xr 500 Mg Xr24h-tab (Metformin hcl) .Marland Kitchen... 2 once daily  Labs Reviewed: Creat: 1.0 (04/10/2009)    Reviewed HgBA1c results: 6.0 (01/04/2010)  6.0 (07/10/2009)  Problem # 2:  HYPERLIPIDEMIA (ICD-272.4) Assessment: Unchanged  His updated medication list for this problem includes:    Lipitor 10 Mg Tabs (Atorvastatin calcium) ..... Once daily  Labs Reviewed: SGOT: 27 (04/10/2009)   SGPT: 26 (04/10/2009)   HDL:31.70 (04/10/2009), 38.7 (04/09/2008)  LDL:86 (04/10/2009), 99 (04/09/2008)  Chol:148 (04/10/2009), 167  (04/09/2008)  Trig:153.0 (04/10/2009), 149 (04/09/2008)  Problem # 3:  TREMOR, ESSENTIAL (ICD-333.1) stable  Problem # 4:  EPIDIDYMITIS, RIGHT (ICD-604.90) improved  Complete Medication List: 1)  Cymbalta 60 Mg Cpep (Duloxetine hcl) .... Take 1 capsule by mouth once a day 2)  Lipitor 10 Mg Tabs (Atorvastatin calcium) .... Once daily 3)  Propranolol Hcl 10 Mg Tabs (Propranolol hcl) .Marland Kitchen.. 1 once daily 4)  Glucophage Xr 500 Mg Xr24h-tab (Metformin hcl) .... 2 once daily 5)  Naproxen 500 Mg Tabs (Naproxen) .... One by mouth two times a day for 10 days 6)  Valium 5 Mg Tabs (Diazepam) .... 1/2 every 8 hours prn  Patient Instructions: 1)  April  for CPX

## 2011-01-27 NOTE — Progress Notes (Signed)
Summary: diazepam refill  Phone Note Refill Request Message from:  Fax from Pharmacy on July 02, 2010 4:12 PM  Refills Requested: Medication #1:  VALIUM 5 MG TABS 1/2 every 8 hours prn Initial call taken by: Kern Reap CMA Duncan Dull),  July 02, 2010 4:12 PM    Prescriptions: VALIUM 5 MG TABS (DIAZEPAM) 1/2 every 8 hours prn  #45 x 0   Entered by:   Kern Reap CMA (AAMA)   Authorized by:   Stacie Glaze MD   Signed by:   Kern Reap CMA (AAMA) on 07/02/2010   Method used:   Telephoned to ...       [facility] [address] (retail)       [address]       [place], [place]  [address]       Ph: [phone] or [phone]       Fax: [phone]   RxID:   [id number]

## 2011-01-27 NOTE — Progress Notes (Signed)
Summary: fatigue  Phone Note Call from Isaiah Dunn   Caller: Isaiah Dunn Call For: Stacie Glaze MD Summary of Call: Fatigue, 99/70, and lethargic with frequent urination.  Wants to go off Diltiazam. [phone] "Tasia Catchings" Initial call taken by: Lynann Beaver CMA,  August 10, 2010 9:45 AM  Follow-up for Phone Call        per dr Lovell Sheehan- stop diltiazem for now and will discuss attnext ov-pt informed Follow-up by: Willy Eddy, LPN,  August 10, 2010 9:57 AM

## 2011-01-27 NOTE — Assessment & Plan Note (Signed)
Summary: cpx/mm   Vital Signs:  Patient profile:   55 year old male Height:      70 inches Weight:      190 pounds BMI:     27.36 Temp:     98.2 degrees F oral Pulse rate:   60 / minute Resp:     14 per minute BP sitting:   124 / 82  (left arm)  Vitals Entered By: Willy Eddy, LPN (Apr 27, 1307 11:26 AM) CC: cpx Is Patient Diabetic? Yes Did you bring your meter with you today? No   CC:  cpx.  History of Present Illness: Pr has MRI of left shoulder at [facility] ortho for shoulder pain possible rotator cuff tear vs impingment DM follow up with reveiwe of his CBG's and moniterin of A1C naposyn gave nausea  The pt was asked about all immunizations, health maint. services that are appropriate to their age and was given guidance on diet exercize  and weight management   Preventive Screening-Counseling & Management  Alcohol-Tobacco     Smoking Status: quit  Problems Prior to Update: 1)  Epididymitis, Right  (ICD-604.90) 2)  Testicular Pain  (ICD-608.9) 3)  Herpes Zoster  (ICD-053.9) 4)  Osteoarthritis  (ICD-715.90) 5)  Preventive Health Care  (ICD-V70.0) 6)  Tremor, Essential  (ICD-333.1) 7)  Osteoarthrosis, Local Nos, Lower Leg  (ICD-715.36) 8)  Diabetes Mellitus, Type II  (ICD-250.00) 9)  Hyperlipidemia  (ICD-272.4)  Current Problems (verified): 1)  Epididymitis, Right  (ICD-604.90) 2)  Testicular Pain  (ICD-608.9) 3)  Herpes Zoster  (ICD-053.9) 4)  Osteoarthritis  (ICD-715.90) 5)  Preventive Health Care  (ICD-V70.0) 6)  Tremor, Essential  (ICD-333.1) 7)  Osteoarthrosis, Local Nos, Lower Leg  (ICD-715.36) 8)  Diabetes Mellitus, Type II  (ICD-250.00) 9)  Hyperlipidemia  (ICD-272.4)  Medications Prior to Update: 1)  Cymbalta 60 Mg Cpep (Duloxetine Hcl) .... Take 1 Capsule By Mouth Once A Day 2)  Lipitor 10 Mg  Tabs (Atorvastatin Calcium) .... Once Daily 3)  Propranolol Hcl 10 Mg Tabs (Propranolol Hcl) .Marland Kitchen.. 1 Once Daily 4)  Glucophage Xr 500 Mg Xr24h-Tab  (Metformin Hcl) .... 2 Once Daily 5)  Naproxen 500 Mg Tabs (Naproxen) .... One By Mouth Two Times A Day For 10 Days 6)  Valium 5 Mg Tabs (Diazepam) .... 1/2 Every 8 Hours Prn  Current Medications (verified): 1)  Cymbalta 60 Mg Cpep (Duloxetine Hcl) .... Take 1 Capsule By Mouth Once A Day 2)  Lipitor 10 Mg  Tabs (Atorvastatin Calcium) .... Once Daily 3)  Propranolol Hcl 10 Mg Tabs (Propranolol Hcl) .Marland Kitchen.. 1 Once Daily 4)  Glucophage Xr 500 Mg Xr24h-Tab (Metformin Hcl) .... 2 Once Daily 5)  Valium 5 Mg Tabs (Diazepam) .... 1/2 Every 8 Hours Prn  Allergies (verified): 1)  Celebrex (Celecoxib) 2)  Clindamycin Hcl (Clindamycin Hcl) 3)  Codeine Sulfate (Codeine Sulfate) 4)  Erythromycin (Erythromycin)  Past History:  Family History: Last updated: 09/06/2007 Family History High cholesterol Family History Hypertension  Social History: Last updated: 09/06/2007 Married Former Smoker  Risk Factors: Exercise: yes (09/06/2007)  Risk Factors: Smoking Status: quit (04/26/2010)  Past medical, surgical, family and social histories (including risk factors) reviewed, and no changes noted (except as noted below).  Past Medical History: Reviewed history from 04/16/2008 and no changes required. Hyperlipidemia Diabetes mellitus, type II Osteoarthritis  Past Surgical History: Reviewed history from 04/16/2008 and no changes required. knee surgery  athroscopic Rotator cuff repair Cervical fusion  (planned 04/2008)  Family  History: Reviewed history from 09/06/2007 and no changes required. Family History High cholesterol Family History Hypertension  Social History: Reviewed history from 09/06/2007 and no changes required. Married Former Smoker  Review of Systems  The patient denies anorexia, fever, weight loss, weight gain, vision loss, decreased hearing, hoarseness, chest pain, syncope, dyspnea on exertion, peripheral edema, prolonged cough, headaches, hemoptysis, abdominal pain,  melena, hematochezia, severe indigestion/heartburn, hematuria, incontinence, genital sores, muscle weakness, suspicious skin lesions, transient blindness, difficulty walking, depression, unusual weight change, abnormal bleeding, enlarged lymph nodes, angioedema, and breast masses.    Physical Exam  General:  Well-developed,well-nourished,in no acute distress; alert,appropriate and cooperative throughout examination Head:  normocephalic.  atraumatic.   Eyes:  pupils equal and pupils round.   Ears:  R ear normal and L ear normal.   Nose:  no external deformity and no nasal discharge.   Mouth:  pharynx pink and moist.  no erythema.   Neck:  supple and full ROM.   Lungs:  normal respiratory effort.  no wheezes.   Heart:  normal rate and regular rhythm.  no murmur.   Abdomen:  soft and non-tender.  no distention.   Msk:  joint tenderness and joint swelling.  leftr shoulder Pulses:  R and L carotid,radial,femoral,dorsalis pedis and posterior tibial pulses are full and equal bilaterally Extremities:  trace left pedal edema and trace right pedal edema.   Neurologic:  alert & oriented X3 and gait normal.    Diabetes Management Exam:    Foot Exam (with socks and/or shoes not present):       Sensory-Pinprick/Light touch:          Left medial foot (L-4): normal          Left dorsal foot (L-5): normal          Left lateral foot (S-1): normal          Right medial foot (L-4): normal          Right dorsal foot (L-5): normal          Right lateral foot (S-1): normal       Sensory-Monofilament:          Left foot: normal          Right foot: normal       Inspection:          Left foot: normal          Right foot: normal       Nails:          Left foot: normal          Right foot: normal   Impression & Recommendations:  Problem # 1:  PREVENTIVE HEALTH CARE (ICD-V70.0) The pt was asked about all immunizations, health maint. services that are appropriate to their age and was given guidance on  diet exercize  and weight management  Orders: EKG w/ Interpretation (93000)  Colonoscopy: normal (10/10/2007) Td Booster: Tdap (04/17/2009)   Flu Vax: Fluvax 3+ (09/18/2008)   Pneumovax: Pneumovax (04/17/2009) Chol: 151 (04/19/2010)   HDL: 49.10 (04/19/2010)   LDL: 70 (04/19/2010)   TG: 158.0 (04/19/2010) TSH: 1.60 (04/19/2010)   HgbA1C: 6.2 (04/19/2010)   PSA: 0.60 (04/19/2010) Next Colonoscopy due:: 09/2017 (04/17/2009)  Discussed using sunscreen, use of alcohol, drug use, self testicular exam, routine dental care, routine eye care, routine physical exam, seat belts, multiple vitamins, osteoporosis prevention, adequate calcium intake in diet, and recommendations for immunizations.  Discussed exercise and checking cholesterol.  Discussed gun safety,  safe sex, and contraception. Also recommend checking PSA.  Problem # 2:  DIABETES MELLITUS, TYPE II (ICD-250.00) monitering cbgs, reviw eof diet and goals for control noted that the microalbumin was positive with discussion of addinG AN ace His updated medication list for this problem includes:    Glucophage Xr 500 Mg Xr24h-tab (Metformin hcl) .Marland Kitchen... 2 once daily    Benazepril Hcl 10 Mg Tabs (Benazepril hcl) ..... One by mouth daily  Labs Reviewed: Creat: 1.1 (04/19/2010)    Reviewed HgBA1c results: 6.2 (04/19/2010)  6.0 (01/04/2010)  Complete Medication List: 1)  Cymbalta 60 Mg Cpep (Duloxetine hcl) .... Take 1 capsule by mouth once a day 2)  Lipitor 10 Mg Tabs (Atorvastatin calcium) .... Once daily 3)  Propranolol Hcl 10 Mg Tabs (Propranolol hcl) .Marland Kitchen.. 1 once daily 4)  Glucophage Xr 500 Mg Xr24h-tab (Metformin hcl) .... 2 once daily 5)  Valium 5 Mg Tabs (Diazepam) .... 1/2 every 8 hours prn 6)  Benazepril Hcl 10 Mg Tabs (Benazepril hcl) .... One by mouth daily  Patient Instructions: 1)  Please schedule a follow-up appointment in 3 months. 2)  BMP prior to visit, ICD-9:401.90 250.00 Prescriptions: BENAZEPRIL HCL 10 MG TABS (BENAZEPRIL  HCL) one by mouth daily  #30 x 11   Entered and Authorized by:   Stacie Glaze MD   Signed by:   Stacie Glaze MD on 04/26/2010   Method used:   Electronically to        [phone] (retail)       [address]       [place], [place]  [address]       Ph: [phone] or [phone]       Fax: [phone]   RxID:   [id number]

## 2011-01-27 NOTE — Letter (Signed)
Summary: [facility]  [facility]   Imported By: Maryln Gottron 06/25/2010 10:41:50  _____________________________________________________________________  External Attachment:    Type:   Image     Comment:   External Document

## 2011-01-27 NOTE — Progress Notes (Signed)
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 Follow-up by: Scherrie Bateman, LPN,  June 17, 2010 9:54 AM     Appended Document: cath questions Would rather someone drive him the day after the cath.  Could drive on Wednesday himself.  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   Appended Document: cath questions I called pt and gave him Dr. Alford Highland additional comments.

## 2011-01-27 NOTE — Assessment & Plan Note (Signed)
Summary: 2wk f/u sl  Medications Added VALIUM 5 MG TABS (DIAZEPAM) 1/2 every 8 hours as needed BENAZEPRIL HCL 10 MG TABS (BENAZEPRIL HCL) 1/4 tablet once daily ROBAXIN 500 MG TABS (METHOCARBAMOL) as needed ASPIRIN 81 MG TABS (ASPIRIN) take one tablet once daily      Allergies Added:   Visit Type:  Follow-up Primary Provider:  Stacie Glaze MD  CC:  pt has some chest discomfort.  History of Present Illness: 55 yo with history of hyperlipidemia and hypertension presented initially for evaluation of chest pain and abnormal myoview.  He had a left heart cath done showing minimal coronary disease.  Since then, he continues to get occasional mild atypical (nonexertional) chest pain.  He still has some exertional dyspnea which tends to come with more strenuous activity such as push-mowing his yard.  He rides his stationary bike 3 days a week and is mildly winded when he finishes.  He has cut his benazepril back to 2.5 mg daily and is not having any orthostatic symptoms.    ECG: NSR, normal  Labs (4/11): LDL 70, HDL 49, K 4.4, creatinine 1.1, TSH normal Labs (6/11): K 4.9, creatinine 1.1  Current Medications (verified): 1)  Cymbalta 60 Mg Cpep (Duloxetine Hcl) .... Take 1 Capsule By Mouth Once A Day 2)  Lipitor 10 Mg  Tabs (Atorvastatin Calcium) .... Once Daily 3)  Propranolol Hcl 10 Mg Tabs (Propranolol Hcl) .Marland Kitchen.. 1 Once Daily 4)  Glucophage Xr 500 Mg Xr24h-Tab (Metformin Hcl) .... 2 Once Daily 5)  Valium 5 Mg Tabs (Diazepam) .... 1/2 Every 8 Hours As Needed 6)  Benazepril Hcl 10 Mg Tabs (Benazepril Hcl) .... 1/4 Tablet Once Daily 7)  Robaxin 500 Mg Tabs (Methocarbamol) .... As Needed 8)  Aspirin 81 Mg Tabs (Aspirin) .... Take One Tablet Once Daily  Allergies (verified): 1)  Celebrex (Celecoxib) 2)  Clindamycin Hcl (Clindamycin Hcl) 3)  Codeine Sulfate (Codeine Sulfate) 4)  Erythromycin (Erythromycin)  Past History:  Past Medical History: 1. Hyperlipidemia 2. Diabetes mellitus,  type II x 14 years 3. Osteoarthritis 4. Rotator cuff tear left shoulder s/p arthroscopic surgery 5/11 5. Depression 6. Essential tremor on propranolol 7. DIabetic nephropathy (proteinuria) 8. Chest pain: ETT-myoview (6/11): 5'33", nonspecific ST-T changes, chest pain + severe dyspnea, hypotensive BP response, EF 61%, normal perfusion images.  Left heart cath (radial approach) 6/11 with minimal coronary disease (25% mid RCA stenosis) and LVEDP 23 mmHg.    Family History: Reviewed history from 06/15/2010 and no changes required. HTN, hyperlipidemia, diabetes.   No known premature CAD  Social History: Reviewed history from 06/15/2010 and no changes required. Married, 1 son Former Smoker (quit 2002) Lives in [place] Works in Radio broadcast assistant  Review of Systems       All systems reviewed and negative except as per HPI.   Vital Signs:  Patient profile:   55 year old male Height:      70 inches Weight:      194 pounds Pulse rate:   84 / minute Pulse rhythm:   regular BP sitting:   102 / 70  (left arm) Cuff size:   regular  Vitals Entered By: Burnett Kanaris, CNA (July 06, 2010 8:29 AM)  Physical Exam  General:  Well developed, well nourished, in no acute distress. Neck:  Neck supple, no JVD. No masses, thyromegaly or abnormal cervical nodes. Lungs:  Clear bilaterally to auscultation and percussion. Heart:  Non-displaced PMI, chest non-tender; regular rate and rhythm, S1, S2 without  murmurs, rubs or gallops. Carotid upstroke normal, no bruit. Pedals normal pulses. No edema, no varicosities.  Right radial cath site without hematoma, 2+ pulse.  Abdomen:  Bowel sounds positive; abdomen soft and non-tender without masses, organomegaly, or hernias noted. No hepatosplenomegaly. Extremities:  No clubbing or cyanosis. Neurologic:  Alert and oriented x 3. Psych:  Normal affect.   Impression & Recommendations:  Problem # 1:  CHEST PAIN (ICD-786.50) Left heart cath showed  minimal coronary disease.  He has had no further severe pain episodes though he continues to have mild occasional episodes of atypical chest pain that is likely noncardiac.  He should continue ASA 81 mg daily.    Problem # 2:  DYSPNEA (ICD-786.05) Patient does have exertional dyspnea with more strenuous activity.  Diastolic CHF is certainly a concern given his elevated LV end diastolic pressure at cath (23 mmHg).  He is at risk for this with diabetes.  I will have him get an echo to quantify diastolic function.   Problem # 3:  HYPERLIPIDEMIA (ICD-272.4) Would keep LDL < 70 given mild CAD and diabetes.   Other Orders: EKG w/ Interpretation (93000) Echocardiogram (Echo)  Patient Instructions: 1)  Your physician has requested that you have an echocardiogram.  Echocardiography is a painless test that uses sound waves to create images of your heart. It provides your doctor with information about the size and shape of your heart and how well your heart's chambers and valves are working.  This procedure takes approximately one hour. There are no restrictions for this procedure. 2)  Your physician recommends that you schedule a follow-up appointment in: as needed with Dr Shirlee Latch.

## 2011-01-27 NOTE — Assessment & Plan Note (Signed)
Summary: np6/abn stress test/jml  Medications Added BENAZEPRIL HCL 10 MG TABS (BENAZEPRIL HCL) one half tablet daily      Allergies Added:   Primary Provider:  Stacie Glaze MD  CC:  new patient abnormal stress test.  Pt said that all of this arose due to chest pain accompanied by jaw pain.  Pt did have orthoscopic surgery in May on his shoulder.  Isaiah Dunn  History of Present Illness: 55 yo with history of hyperlipidemia and hypertension presents for evaluation of chest pain and abnormal myoview.  On 06/05/10, patient was digging in his garden for about an hour.  After walking back into his house, he developed severe pain across his chest that radiated up into his jaw bilaterally.  He laid down in bed and the pain resolved completely.  He has not had pain like this before or since.  He tends to have reasonable exercise tolerance with no trouble climbing steps and mild shortness of breath with strenuous activity.  He was able to push mow his yard last Saturday with some shortness of breath but he did not stop.  Of note, patient was started on benazepril 10 mg daily in 5/11 for proteinuria likely representing diabetic nephropathy.  He has had some lightheadedness with standing and initially decreased the dose and then was told this week to stop benazepril altogether.    ETT-myoview was done given the chest pain episode.  Patient had below average exercise tolerance and developed mild chest pain and severe shortness of breath on the treadmill.  There were nonspecific ECG changes.  He had normal perfusion images, but he did have a hypotensive BP response to exercise.   ECG: NSR, normal  Labs (4/11): LDL 70, HDL 49, K 4.4, creatinine 1.1, TSH normal  Current Medications (verified): 1)  Cymbalta 60 Mg Cpep (Duloxetine Hcl) .... Take 1 Capsule By Mouth Once A Day 2)  Lipitor 10 Mg  Tabs (Atorvastatin Calcium) .... Once Daily 3)  Propranolol Hcl 10 Mg Tabs (Propranolol Hcl) .Isaiah Dunn.. 1 Once Daily 4)   Glucophage Xr 500 Mg Xr24h-Tab (Metformin Hcl) .... 2 Once Daily 5)  Valium 5 Mg Tabs (Diazepam) .... 1/2 Every 8 Hours Prn 6)  Benazepril Hcl 10 Mg Tabs (Benazepril Hcl) .... Stopped On 6-20- Pt Informed  Allergies (verified): 1)  Celebrex (Celecoxib) 2)  Clindamycin Hcl (Clindamycin Hcl) 3)  Codeine Sulfate (Codeine Sulfate) 4)  Erythromycin (Erythromycin)  Past History:  Past Medical History: 1. Hyperlipidemia 2. Diabetes mellitus, type II x 14 years 3. Osteoarthritis 4. Rotator cuff tear left shoulder s/p arthroscopic surgery 5/11 5. Depression 6. Essential tremor on propranolol 7. DIabetic nephropathy (proteinuria) 8. ETT-myoview (6/11): 5'33", nonspecific ST-T changes, chest pain + severe dyspnea, hypotensive BP response, EF 61%, normal perfusion images.   Family History: HTN, hyperlipidemia, diabetes.   No known premature CAD  Social History: Married, 1 son Former Games developer (quit 2002) Lives in [place] Works in Radio broadcast assistant  Review of Systems       All systems reviewed and negative except as per HPI.   Vital Signs:  Patient profile:   55 year old male Height:      70 inches Weight:      195 pounds BMI:     28.08 Pulse rate:   75 / minute Pulse rhythm:   regular BP sitting:   116 / 76  (right arm) Cuff size:   regular  Vitals Entered By: Judithe Modest CMA (June 15, 2010 4:02 PM)  Physical Exam  General:  Well developed, well nourished, in no acute distress. Head:  normocephalic and atraumatic Nose:  no deformity, discharge, inflammation, or lesions Mouth:  Teeth, gums and palate normal. Oral mucosa normal. Neck:  Neck supple, no JVD. No masses, thyromegaly or abnormal cervical nodes. Lungs:  Clear bilaterally to auscultation and percussion. Heart:  Non-displaced PMI, chest non-tender; regular rate and rhythm, S1, S2 without murmurs, rubs or gallops. Carotid upstroke normal, no bruit. Pedals normal pulses. No edema, no varicosities. Abdomen:   Bowel sounds positive; abdomen soft and non-tender without masses, organomegaly, or hernias noted. No hepatosplenomegaly. Msk:  Back normal, normal gait. Muscle strength and tone normal. Extremities:  No clubbing or cyanosis. Neurologic:  Alert and oriented x 3. Skin:  Intact without lesions or rashes. Psych:  Normal affect.   Impression & Recommendations:  Problem # 1:  CHEST PAIN (ICD-786.50) Patient had a worrisome episode of chest pain a little over a week ago.  On his ETT-myoview, he had chest pain/severe dyspnea, below average exercise tolerance, and a hypotensive BP response.  However, his myoview perfusion images were normal.   Patient has had diabetes for 14 years and has hyperlipidemia.  His stress test worries me as a hypotensive BP response can be seen with severe 3 vessel disease or left main disease that might give balanced ischemia and normal perfusion images, though it is possible that the hypotensive BP response may be related to his benazepril.  I had a long discussion with him about how to followup on his equivocal myoview (coronary CTA versus cath).  Given the overall picture with baseline higher risk from diabetes, I have elected to do a left heart cath.  I will plan to do this from radial access.  He will hold his metformin the night before and then 48 hours afterwards.  He will start aspirin 81 mg daily.   Problem # 2:  HYPERLIPIDEMIA (ICD-272.4) LDL was at goal in 4/11.   I will have patient continue on a very low dose of benazepril, 2.5 mg daily.   Patient Instructions: 1)  Your physician has recommended you make the following change in your medication:  2)  Take Benazepril 5mg  daily 3)  Your physician has requested that you have a cardiac catheterization.  Cardiac catheterization is used to diagnose and/or treat various heart conditions. Doctors may recommend this procedure for a number of different reasons. The most common reason is to evaluate chest pain. Chest pain  can be a symptom of coronary artery disease (CAD), and cardiac catheterization can show whether plaque is narrowing or blocking your heart's arteries. This procedure is also used to evaluate the valves, as well as measure the blood flow and oxygen levels in different parts of your heart.  For further information please visit [email or web address]  Please follow instruction sheet, as given. 4)  Your physician recommends that you schedule a follow-up appointment with Dr Shirlee Latch 2 weeks after the cardiac cathetherization.

## 2011-01-27 NOTE — Letter (Signed)
Summary: Cardiac Catheterization Instructions- [facility]  [facility] [employer], Main Office  [address]   [place], [place] [address]   Phone: [phone]  Fax: [phone]     06/16/2010 MRN: [id number]  [facility] [address] [place], [place]  [address]  Dear [name],   You are scheduled for a Cardiac Catheterization on Monday June 27,2011 with Dr. Marca Ancona.  Please arrive to the 1st floor of the Heart and Vascular Center at [facility] at 10:30 am  on the day of your procedure. Please do not arrive before 6:30 a.m. Call the Heart and Vascular Center at [phone] if you are unable to make your appointmnet. The Code to get into the parking garage under the building is 0100. Take the elevators to the 1st floor. You must have someone to drive you home. Someone must be with you for the first 24 hours after you arrive home. Please wear clothes that are easy to get on and off and wear slip-on shoes. Do not eat or drink after midnight except water with your medications that morning. Bring all your medications and current insurance cards with you.  _x__ DO NOT take these medications before your procedure:    DO NOT TAKE GLUCOPHAGE THE NIGHT BEFORE THE PROCEDURE AND 48 HOURS AFTERWARDS.    _x__ You may take ALL of your other  medications with water that morning.       The usual length of stay after your procedure is 2 to 3 hours. This can vary.  If you have any questions, please call the office at the number listed above.   Katina Dung, RN, BSN

## 2011-01-27 NOTE — Progress Notes (Signed)
Summary: wants test results   Phone Note Call from Patient   Caller: Patient [phone] Reason for Call: Talk to Nurse, Lab or Test Results Summary of Call: had test done 7-27 wants to see if results are in- pls call [phone] Initial call taken by: Glynda Jaeger,  July 26, 2010 9:10 AM  Follow-up for Phone Call        Phone Call Completed. PT AWARE  HEART APPEARS A LITTLE STIFF PER DR ROSS  WILL FORWARD TO DR [facility]  FOR EVAL AND TREATMENT. Follow-up by: Scherrie Bateman, LPN,  July 26, 2010 2:40 PM     Appended Document: wants test results AT THIS TIME PT DOES NOT HAVE F/U SET UP  OR IF ECHO NEEDS REPEATED AND WHEN IF NECESSARY PLEASE ADVISE./CY  Appended Document: wants test results No need for followup at this time.  Can see me in a year unless symptoms worsen.   Appended Document: wants test results The pt is aware he will be due for f/u in 1 year with Dr. Shirlee Latch. He will call back sooner should he need [place] before then.

## 2011-01-27 NOTE — Cardiovascular Report (Signed)
Summary: Pre Cath Orders   Pre Cath Orders   Imported By: Roderic Ovens 06/25/2010 12:10:22  _____________________________________________________________________  External Attachment:    Type:   Image     Comment:   External Document

## 2011-01-27 NOTE — Progress Notes (Signed)
Summary: chest pain  Phone Note Call from Patient Call back at [phone] W   Summary of Call: Chest pains & jaw pain yesterday after doing a little digging.  Did not feel good.  Today feel a little better, no pain, but feel a little nauseous & little dizzy.  Took a baby ASA & laid down.   Initial call taken by: Rudy Jew, RN,  June 07, 2010 9:22 AM  Follow-up for Phone Call        Stress myoview per Dr. Shela Commons & continue ASA.  To Er if chest pain. Follow-up by: Rudy Jew, RN,  June 07, 2010 9:21 AM  New Problems: CHEST PAIN (ICD-786.50)   New Problems: CHEST PAIN (ICD-786.50)

## 2011-01-27 NOTE — Assessment & Plan Note (Signed)
Summary: Cardiology Nuclear Study  Nuclear Med Background Indications for Stress Test: Evaluation for Ischemia     Symptoms: Chest Pain with Exertion, Dizziness, DOE, Fatigue, Nausea, Near Syncope, Palpitations    Nuclear Pre-Procedure Cardiac Risk Factors: History of Smoking, Lipids, NIDDM Caffeine/Decaff Intake: None NPO After: 9:00 PM Lungs: clear IV 0.9% NS with Angio Cath: 18g     IV Site: (R) AC IV Started by: Stanton Kidney EMT-P Chest Size (in) 40     Height (in): 70 Weight (lb): 192 BMI: 27.65 Tech Comments: Propranolol held this am, per Patient.  Nuclear Med Study 1 or 2 day study:  1 day     Stress Test Type:  Stress Reading MD:  Charlton Haws, MD     Referring MD:  J.Jenkins Resting Radionuclide:  Technetium 98m Tetrofosmin     Resting Radionuclide Dose:  10 mCi  Stress Radionuclide:  Technetium 70m Tetrofosmin     Stress Radionuclide Dose:  33 mCi   Stress Protocol Exercise Time (min):  5.33 min     Max HR:  146 bpm     Predicted Max HR:  166 bpm  Max Systolic BP: 125 mm Hg     Percent Max HR:  87.95 %     METS: 7.00 Rate Pressure Product:  29518    Stress Test Technologist:  Frederick Peers EMT-P     Nuclear Technologist:  Domenic Polite CNMT  Rest Procedure  Myocardial perfusion imaging was performed at rest 45 minutes following the intravenous administration of Myoview Technetium 21m Tetrofosmin.  Stress Procedure  The patient exercised for 5:33 mins.  The patient stopped due to SOB/leg fatigue and denied any chest pain.  There were non specific  ST-T wave changes.  Myoview was injected at peak exercise and myocardial perfusion imaging was performed after a brief delay.  QPS Raw Data Images:  Normal; no motion artifact; normal heart/lung ratio. Stress Images:  NI: Uniform and normal uptake of tracer in all myocardial segments. Rest Images:  Normal homogeneous uptake in all areas of the myocardium. Subtraction (SDS):  SDS 1 Transient Ischemic Dilatation:  1   (Normal <1.22)  Lung/Heart Ratio:  .35  (Normal <0.45)  Quantitative Gated Spect Images QGS EDV:  85 ml QGS ESV:  33 ml QGS EF:  61 % QGS cine images:  normal  Findings Normal nuclear study Clinically Abnormal (chest pain, ST abnormality, hypotension)      Overall Impression  Exercise Capacity: Poor exercise capacity. BP Response: Hypotensive blood pressure response. Clinical Symptoms: Dysnpnea ECG Impression: Lots of artifact no ischemia Overall Impression: Poor exercise capacity with failue to augment BP.  normal nuclear images  Appended Document: Cardiology Nuclear Study due to hypotensive response during cardiolyte  needs referral to cardiology cut benazepril in 1/2 and moniter blood pressure conserned due to DM appointment ASAP  Appended Document: Orders Update     Clinical Lists Changes  Orders: Added new Referral order of Cardiology Referral (Cardiology) - Signed      Appended Document: Cardiology Nuclear Study pt informed- told of cardiology appointment and he is already taking 1/2 of benazepril- therefore dr Lovell Sheehan said to stop benazepril

## 2011-01-27 NOTE — Letter (Signed)
Summary: [employer]   Imported By: Marylou Mccoy 07/20/2010 13:43:00  _____________________________________________________________________  External Attachment:    Type:   Image     Comment:   External Document

## 2011-03-14 LAB — POCT I-STAT 4, (NA,K, GLUC, HGB,HCT)
Glucose, Bld: 116 mg/dL — ABNORMAL HIGH (ref 70–99)
HCT: 42 % (ref 39.0–52.0)
Hemoglobin: 14.3 g/dL (ref 13.0–17.0)
Potassium: 4 mEq/L (ref 3.5–5.1)
Sodium: 141 mEq/L (ref 135–145)

## 2011-03-22 ENCOUNTER — Telehealth: Payer: Self-pay | Admitting: *Deleted

## 2011-03-22 ENCOUNTER — Ambulatory Visit (INDEPENDENT_AMBULATORY_CARE_PROVIDER_SITE_OTHER): Payer: 59 | Admitting: Internal Medicine

## 2011-03-22 ENCOUNTER — Encounter: Payer: Self-pay | Admitting: Internal Medicine

## 2011-03-22 ENCOUNTER — Other Ambulatory Visit: Payer: Self-pay | Admitting: Internal Medicine

## 2011-03-22 VITALS — BP 114/70 | Temp 98.3°F | Ht 70.0 in | Wt 212.0 lb

## 2011-03-22 DIAGNOSIS — T148 Other injury of unspecified body region: Secondary | ICD-10-CM

## 2011-03-22 DIAGNOSIS — T63391A Toxic effect of venom of other spider, accidental (unintentional), initial encounter: Secondary | ICD-10-CM

## 2011-03-22 DIAGNOSIS — E119 Type 2 diabetes mellitus without complications: Secondary | ICD-10-CM

## 2011-03-22 DIAGNOSIS — T6391XA Toxic effect of contact with unspecified venomous animal, accidental (unintentional), initial encounter: Secondary | ICD-10-CM

## 2011-03-22 DIAGNOSIS — W57XXXA Bitten or stung by nonvenomous insect and other nonvenomous arthropods, initial encounter: Secondary | ICD-10-CM

## 2011-03-22 DIAGNOSIS — T63301A Toxic effect of unspecified spider venom, accidental (unintentional), initial encounter: Secondary | ICD-10-CM

## 2011-03-22 MED ORDER — METHYLPREDNISOLONE ACETATE 80 MG/ML IJ SUSP
80.0000 mg | Freq: Once | INTRAMUSCULAR | Status: AC
  Administered 2011-03-22: 80 mg via INTRAMUSCULAR

## 2011-03-22 NOTE — Telephone Encounter (Signed)
Scheduled with Dr K

## 2011-03-22 NOTE — Telephone Encounter (Signed)
Pt has had nausea, vomiting and diarrhea x 2-3 days with ? Fever.  Now, he has found (what he feels is a black widow bite) on his hip.  Has muscle aches and extreme fatigue.

## 2011-03-22 NOTE — Patient Instructions (Signed)
Call or return to clinic prn if these symptoms worsen or fail to improve as anticipated.

## 2011-03-22 NOTE — Telephone Encounter (Signed)
appointment

## 2011-03-22 NOTE — Progress Notes (Signed)
  Subjective:    Patient ID: Isaiah Dunn, male    DOB: [birth year], 55 y.o.   MRN: [id number]  HPI   55 year old patient who sustained an insect bite to the left hip region 3 days ago. Following this insect bite he has developed a flulike illness with generalized achiness fatigue anorexia and nausea. He slept for in excess of 12 hours yesterday after he returned early from work. Today he feels improved. He does have a history of type 2 diabetes controlled with oral agents.    Review of Systems  Constitutional: Positive for appetite change and fatigue. Negative for fever and chills.  HENT: Negative for hearing loss, ear pain, congestion, sore throat, trouble swallowing, neck stiffness, dental problem, voice change and tinnitus.   Eyes: Negative for pain, discharge and visual disturbance.  Respiratory: Negative for cough, chest tightness, wheezing and stridor.   Cardiovascular: Negative for chest pain, palpitations and leg swelling.  Gastrointestinal: Positive for nausea. Negative for vomiting, abdominal pain, diarrhea, constipation, blood in stool and abdominal distention.  Genitourinary: Negative for urgency, hematuria, flank pain, discharge, difficulty urinating and genital sores.  Musculoskeletal: Positive for myalgias. Negative for back pain, joint swelling, arthralgias and gait problem.  Skin: Negative for rash.  Neurological: Positive for weakness. Negative for dizziness, syncope, speech difficulty, numbness and headaches.  Hematological: Negative for adenopathy. Does not bruise/bleed easily.  Psychiatric/Behavioral: Negative for behavioral problems and dysphoric mood. The patient is not nervous/anxious.        Objective:   Physical Exam  Constitutional: He is oriented to person, place, and time. He appears well-developed.  HENT:  Head: Normocephalic.  Right Ear: External ear normal.  Left Ear: External ear normal.  Eyes: Conjunctivae and EOM are normal.  Neck: Normal range of  motion.  Cardiovascular: Normal rate and normal heart sounds.   Pulmonary/Chest: Breath sounds normal.  Abdominal: Bowel sounds are normal.  Musculoskeletal: Normal range of motion. He exhibits no edema and no tenderness.  Neurological: He is alert and oriented to person, place, and time.  Skin:        2 small puncture marks were noted involving the left hip area there is no surrounding erythema that was minimally tender no induration  Psychiatric: He has a normal mood and affect. His behavior is normal.          Assessment & Plan:   probably mild systemic reaction to insect bite. He is improved today. Options were discussed including nonsteroidal anti-inflammatory medications versus a cortisone injection. He will call if not improved

## 2011-04-11 LAB — POCT I-STAT 4, (NA,K, GLUC, HGB,HCT)
Glucose, Bld: 100 mg/dL — ABNORMAL HIGH (ref 70–99)
HCT: 46 % (ref 39.0–52.0)
Hemoglobin: 15.6 g/dL (ref 13.0–17.0)
Sodium: 142 mEq/L (ref 135–145)

## 2011-04-15 ENCOUNTER — Other Ambulatory Visit: Payer: Self-pay | Admitting: Internal Medicine

## 2011-05-10 NOTE — Op Note (Signed)
NAMEKIM, LAUVER NO.:  [id number]   MEDICAL RECORD NO.:  [id number]           PATIENT TYPE:   LOCATION:                                 FACILITY:   PHYSICIAN:  Alvy Beal, MD    DATE OF BIRTH:  30-Jun-1956   DATE OF PROCEDURE:  DATE OF DISCHARGE:                               OPERATIVE REPORT   FIRST ASSISTANT:  ANDREA CIARAVINO, PA-C   PREOPERATIVE DIAGNOSIS:  Two-level degenerative cervical radiculopathy  C5-C6 and C6-C7 with right arm pain.   POSTOPERATIVE DIAGNOSIS:  Two-level degenerative cervical radiculopathy  C5-C6 and C6-C7 with right arm pain.   OPERATIVE PROCEDURE:  Anterior cervical diskectomy and fusion C5-C6 and  C6-C7.   INSTRUMENTATION USED:  Synthes factor anterior cervical plate with 29-FA  screws fixing into the C6-C5, 14-mm screws at C6 and C7, a 7-mm lordotic  precut fibular allograft strut at C5-C6, and an 8-mm lordotic fibular  strut at C6-C7.   HISTORY:  This is a very pleasant 55 year old gentleman with significant  neck and right arm pain.  Attempts at conservative management have  failed to alleviate his symptoms.  After discussing treatment options,  he elected to proceed with surgery.  All appropriate risks, benefits,  and alternatives were discussed.   OPERATIVE NOTE:  The patient was brought to the operating room, placed  supine on the operating table.  After successful induction of general  anesthesia endotracheal intubation, TED and SCDs were applied, and  roller towels were placed between the shoulder blades.  The shoulders  were secured with tape, and the anterior cervical spine was prepped and  draped in a standard fashion.   A longitudinal transverse incision was made right over the cricoid  cartilage.  Sharp dissection was carried out down to and through the  platysma.  I identified the medial border of the sternocleidomastoid and  began dissecting sharply through the deep cervical fascia and mobilized  omohyoid with the trachea and esophagus to the right.  I then dissected  through the prevertebral fascia to expose the anterior cervical spine.  I placed a needle into the C5-C6 disk space, took a lateral x-ray, and  confirmed that I was at the appropriate level.  The longus coli muscles  at the C5-C6 and C6-C7 disk space levels laterally at the level of the  uncovertebral joint.   Self-retaining retractors were then placed into the wound.  The  endotracheal cuff was deflated.  The retractors were expanded at this  point, once the endotracheal cuff was deflated and reinflated and once  the retractors were properly positioned.  Distraction pins were placed  into the side of C6-C7 vertebral bodies, and the C5-C6 disk space was  distracted.   The annulus was then sharply incised with a 15-blade scalpel.  Then,  using a combination of pituitary rongeurs, curettes, and Kerrison  rongeurs, I resected the C5-C6 disk space.  I then used a microcurette  to develop a plane between the posterior longitudinal ligament and the  thecal sac, and used a 1-mm Kerrison to resect the posterior  longitudinal ligament and  take down significant amount of the bone spur  as much as I could to safely remove the bone spurs.  At this point, I  could clearly take a nerve hook and palpate out underneath the vertebral  body and out over the uncovertebral joint and adequate decompression.   I repeated the entire procedure at the C6-C7 level, again ensuring that  I had resected the posterior longitudinal ligament, and I had adequate  neural foraminal decompression.  There was a small fragmented disk at  this level, which was also resected.   At this point, I measured the interbody spaces, rasped the endplates, so  I had bleeding subchondral bone, and placed the 7-mm allograft fibular  strut packed with Actifuse into the C5-C6 disk space and then an 8 mm at  the C6-C7 disk space.  I then contoured a 36-mm anterior  cervical plate,  affixed it to the body at C5 and C7, and confirmed satisfactory position  in the lateral plane.  I then affixed it to the body of C6.  I made sure  all the screws were tightened.  I removed all the remaining retractors  and then evaluated the esophagus to ensure that it was not entrapped  beneath the plate.  Once confirmed, I irrigated the wound copiously with  normal saline, obtained hemostasis with bipolar electrocautery, and then  allowed the trachea and esophagus to return in the midline.  I then  closed the platysma with interrupted 2-0 Vicryl suture and 3-0 Monocryl  for the skin.  Steri-Strips and a stiff collar were applied.  The  patient was extubated, transferred to the PACU without incident.  At the  end of the case, all needle and sponge counts were correct.      Alvy Beal, MD  Electronically Signed     DDB/MEDQ  D:  04/24/2008  T:  04/25/2008  Job:  [id number]

## 2011-05-10 NOTE — Op Note (Signed)
Isaiah Dunn, Isaiah Dunn             ACCOUNT NO.:  [id number]   MEDICAL RECORD NO.:  [id number]          PATIENT TYPE:  AMB   LOCATION:  [facility]                         FACILITY:  [facility]   PHYSICIAN:  Marlowe Kays, M.D.  DATE OF BIRTH:  02/05/1956   DATE OF PROCEDURE:  12/31/2008  DATE OF DISCHARGE:                               OPERATIVE REPORT   PREOPERATIVE DIAGNOSIS:  Recurrent tear of medial meniscus, right knee.   POSTOPERATIVE DIAGNOSIS:  Recurrent tear of medial meniscus, right knee.   OPERATION:  Right knee arthroscopy with partial medial meniscectomy.   SURGEON:  J. Aplington, M.D.   ASSISTANT:  Nurse.   ANESTHESIA:  General.   PATHOLOGY AND JUSTIFICATION FOR PROCEDURE:  I performed a prior right  knee arthroscopy with partial medial meniscectomy of March 29, 2004.  He  was doing well until early November 14, 2008, when he sustained a  twisting injury working in his yard.  He has had persistent pain and  swelling in the knee with tenderness over the medial joint line.  An MRI  on December 09, 2008 showed what probably was recurrent tear of the  medial meniscus.  He also was felt to have some chondromalacia of the  medial compartment and a possible chondral fissure in the trochlear  apex.  It is the pain and swelling that is bringing him here today for  the arthroscopy.   PROCEDURE:  Satisfactory general anesthesia, Ace wrap and knee support  to left lower extremity, pneumatic tourniquet to right lower extremity  with the right leg Esmarched out non-sterilely.  Thigh stabilizer  applied.  Leg was prepped with DuraPrep from stabilizer to ankle and  draped in a sterile field.  Superior medial saline inflow.  First, with  an anteromedial portal, the lateral compartment of the knee joint was  evaluated.  As previously noted, he had little fraying of the lateral  meniscus,but the joint surfaces and meniscus were basically intact.  Looking at the lateral gutter and  suprapatellar area, his patella  surface looked normal.  I could not notice any significant  chondromalacia in the trochlear notch.  I then reversed portals  medially.  He did have some grade 1 chondromalacia of the medial femoral  condyle which I gently smoothed down with a 3.5 shaver.  He had some  fraying of the posterior portion of the medial meniscus and just prior  to this at the posterior curve was a small tear.  Both of these areas  were pictured and resected with a combination of small baskets and a 3.5  shaver.  Final pictures were taken.  I then irrigated his knee joint  until clear and removed all fluid possible.  I closed the 2 entry  portals with 4-0 nylon and then injected 20 mL of half-percent Marcaine  with adrenaline and 4 mg of morphine through the inflow  apparatus which was removed and this portal was closed with 4-0 nylon as  well.  Betadine and Adaptic dressing were applied.  The tourniquet was  released.  He tolerated the procedure well and was taken to the recovery  room in satisfactory condition with no known complications.           ______________________________  Marlowe Kays, M.D.     JA/MEDQ  D:  12/31/2008  T:  12/31/2008  Job:  [id number]

## 2011-05-13 NOTE — Op Note (Signed)
Isaiah Dunn, Isaiah Dunn             ACCOUNT NO.:  [id number]   MEDICAL RECORD NO.:  [id number]          PATIENT TYPE:  AMB   LOCATION:  DAY                          FACILITY:  [facility]   PHYSICIAN:  Marlowe Kays, M.D.  DATE OF BIRTH:  12/31/1955   DATE OF PROCEDURE:  04/25/2006  DATE OF DISCHARGE:                                 OPERATIVE REPORT   PREOP DIAGNOSIS:  Chronic impingement syndrome with rotator cuff  tendinopathy.   POSTOP DIAGNOSIS:  Chronic impingement syndrome with rotator cuff  tendinopathy.   OPERATION:  Right shoulder arthroscopy (normal examination and arthroscopic  subacromial decompression and decompression the distal clavicle.   SURGEON:  Marlowe Kays, M.D.   ASSISTANT:  Mr. Adrian Blackwater.   ANESTHESIA:  General.   PATHOLOGY AND JUSTIFICATION FOR PROCEDURE:  Has a history of right shoulder  arthroscopy with SAD by me in 1997.  Recently has developed recurrent pain  with an MRI demonstrating what appeared to be a partial tear of the  subscapularis tendon and impingement of the distal clavicle on the rotator  cuff.  A plain x-ray of the shoulder demonstrated normal looking AC joint  and he had no significant tenderness there.  Preoperatively, he understood  that we would try and perform surgery arthroscopically but if need be, open  repair the rotator cuff might be indicated.   PROCEDURE:  Satisfactory general anesthesia, beach-chair position on the  Allen frame, right shoulder girdle was prepped with DuraPrep, draped sterile  field.  Anatomy of shoulder joint was marked out and lateral and posterior  portals and subacromial space were infiltrated 1/2% Marcaine with Adrenalin.  Through posterior soft spot portal atraumatically entered the glenohumeral  joint.  He had some minimal fraying of the biceps tendon.  Rotator cuff  looked normal including subscapularis tendon, labrum and humeral head looked  normal.  I then redirected the scope in the  subacromial space anterolateral  portal introduced a 4.2 shaver.  He had fairly significant amounts of  subacromial bursal tissue which I resected with the shaver and then brought  in the ArthroCare 90 degrees vaporizer to begin dissecting soft tissue from  the undersurface of the acromion and back medial to the [facility] joint.  Followed  this with the 4 mm oval bur resecting bone clearing the undersurface of the  distal clavicle and the acromion until we had a wide subacromial  decompression as evidenced by confirmatory pictures with his arm to side and  the arm abducted with the vaporizer in place.  Then inspected the rotator  cuff moving the arm through a full internal-external rotation.  There was  some roughening to it but no frank tearing.  Documentary pictures were  taken.  We then removed all fluid possible from subacromial space, I  infiltrated the portals and the subacromial space with 0.5% Marcaine with  Adrenalin.  The two portals were closed 4-0 nylon followed by Betadine and  Adaptic dry sterile dressing, shoulder immobilizer.  Tolerated the procedure  well and there was taken to the recovery room satisfactory condition with no  complications.  ______________________________  Marlowe Kays, M.D.     JA/MEDQ  D:  04/25/2006  T:  04/26/2006  Job:  [id number]

## 2011-06-01 ENCOUNTER — Other Ambulatory Visit (INDEPENDENT_AMBULATORY_CARE_PROVIDER_SITE_OTHER): Payer: 59

## 2011-06-01 DIAGNOSIS — Z0000 Encounter for general adult medical examination without abnormal findings: Secondary | ICD-10-CM

## 2011-06-01 LAB — HEMOGLOBIN A1C: Hgb A1c MFr Bld: 6.7 % — ABNORMAL HIGH (ref 4.6–6.5)

## 2011-06-01 LAB — HEPATIC FUNCTION PANEL
AST: 24 U/L (ref 0–37)
Alkaline Phosphatase: 110 U/L (ref 39–117)
Bilirubin, Direct: 0.2 mg/dL (ref 0.0–0.3)
Total Bilirubin: 0.5 mg/dL (ref 0.3–1.2)

## 2011-06-01 LAB — CBC WITH DIFFERENTIAL/PLATELET
Basophils Absolute: 0 10*3/uL (ref 0.0–0.1)
Eosinophils Relative: 3.8 % (ref 0.0–5.0)
Lymphocytes Relative: 35.9 % (ref 12.0–46.0)
Lymphs Abs: 2.5 10*3/uL (ref 0.7–4.0)
Monocytes Relative: 8.7 % (ref 3.0–12.0)
Neutrophils Relative %: 51.2 % (ref 43.0–77.0)
Platelets: 256 10*3/uL (ref 150.0–400.0)
RDW: 15.3 % — ABNORMAL HIGH (ref 11.5–14.6)
WBC: 6.9 10*3/uL (ref 4.5–10.5)

## 2011-06-01 LAB — POCT URINALYSIS DIPSTICK
Ketones, UA: NEGATIVE
Leukocytes, UA: NEGATIVE
Nitrite, UA: NEGATIVE
Protein, UA: NEGATIVE
Urobilinogen, UA: 0.2

## 2011-06-01 LAB — BASIC METABOLIC PANEL
BUN: 20 mg/dL (ref 6–23)
Calcium: 9.2 mg/dL (ref 8.4–10.5)
Creatinine, Ser: 1.1 mg/dL (ref 0.4–1.5)
GFR: 77.85 mL/min (ref >60.00)
Glucose, Bld: 120 mg/dL — ABNORMAL HIGH (ref 70–99)

## 2011-06-01 LAB — LIPID PANEL
Total CHOL/HDL Ratio: 3
VLDL: 15.2 mg/dL (ref 0.0–40.0)

## 2011-06-01 LAB — PSA: PSA: 0.46 ng/mL (ref 0.10–4.00)

## 2011-06-01 LAB — MICROALBUMIN / CREATININE URINE RATIO: Creatinine,U: 203.8 mg/dL

## 2011-06-05 ENCOUNTER — Other Ambulatory Visit: Payer: Self-pay | Admitting: Internal Medicine

## 2011-06-08 ENCOUNTER — Encounter: Payer: Self-pay | Admitting: Internal Medicine

## 2011-06-17 ENCOUNTER — Ambulatory Visit (INDEPENDENT_AMBULATORY_CARE_PROVIDER_SITE_OTHER): Payer: 59 | Admitting: Internal Medicine

## 2011-06-17 DIAGNOSIS — E1169 Type 2 diabetes mellitus with other specified complication: Secondary | ICD-10-CM

## 2011-06-17 DIAGNOSIS — Z0000 Encounter for general adult medical examination without abnormal findings: Secondary | ICD-10-CM

## 2011-06-17 NOTE — Progress Notes (Signed)
  Subjective:    Patient ID: Isaiah Dunn, male    DOB: 07-Apr-1956, 55 y.o.   MRN: [id number]  HPI  cpx     Review of Systems  Constitutional: Negative for fever and fatigue.       Night sweats  HENT: Negative for hearing loss, congestion, neck pain and postnasal drip.   Eyes: Negative for discharge, redness and visual disturbance.  Respiratory: Negative for cough, shortness of breath and wheezing.   Cardiovascular: Negative for leg swelling.  Gastrointestinal: Negative for abdominal pain, constipation and abdominal distention.  Genitourinary: Negative for urgency and frequency.  Musculoskeletal: Negative for joint swelling and arthralgias.  Skin: Negative for color change and rash.  Neurological: Negative for weakness and light-headedness.  Hematological: Negative for adenopathy.  Psychiatric/Behavioral: Negative for behavioral problems.   Past Medical History  Diagnosis Date  . DIABETES MELLITUS, TYPE II 05/11/2007  . DIASTOLIC DYSFUNCTION 07/06/2010  . DYSPNEA 07/06/2010  . HERPES ZOSTER 07/17/2009  . HYPERLIPIDEMIA 05/11/2007  . OSTEOARTHRITIS 04/16/2008  . OSTEOARTHROSIS, LOCAL NOS, LOWER LEG 09/06/2007  . TREMOR, ESSENTIAL 09/06/2007   Past Surgical History  Procedure Date  . Total knee arthroplasty   . Rotator cuff repair   . Cervical fusion     reports that he quit smoking about 11 years ago. He has never used smokeless tobacco. He reports that he drinks alcohol. He reports that he does not use illicit drugs. family history is not on file. Allergies  Allergen Reactions  . Celecoxib     REACTION: unspecified  . Clindamycin Hcl     REACTION: unspecified  . Codeine Sulfate     REACTION: unspecified  . Erythromycin     REACTION: unspecified       Objective:   Physical Exam  Constitutional: He appears well-developed and well-nourished.  HENT:  Head: Normocephalic and atraumatic.  Eyes: Conjunctivae are normal. Pupils are equal, round, and reactive to light.    Neck: Normal range of motion. Neck supple.  Cardiovascular: Normal rate and regular rhythm.   Pulmonary/Chest: Effort normal and breath sounds normal.  Abdominal: Soft. Bowel sounds are normal.  Genitourinary: Rectum normal and prostate normal.  Musculoskeletal: Normal range of motion.  Neurological: He is alert.  Skin: Skin is warm and dry.          Assessment & Plan:   Patient presents for yearly preventative medicine examination.   all immunizations and health maintenance protocols were reviewed with the patient and they are up to date with these protocols.   screening laboratory values were reviewed with the patient including screening of hyperlipidemia PSA renal function and hepatic function.   There medications past medical history social history problem list and allergies were reviewed in detail.   Goals were established with regard to weight loss exercise diet in compliance with medications

## 2011-06-17 NOTE — Patient Instructions (Signed)
I think the key to better diabetic control will be the plan to meals and snacks. A high-protein snack before bedtime would be efficacious

## 2011-08-03 ENCOUNTER — Other Ambulatory Visit: Payer: Self-pay | Admitting: Internal Medicine

## 2011-09-11 ENCOUNTER — Other Ambulatory Visit: Payer: Self-pay | Admitting: Internal Medicine

## 2011-09-15 ENCOUNTER — Other Ambulatory Visit (INDEPENDENT_AMBULATORY_CARE_PROVIDER_SITE_OTHER): Payer: 59

## 2011-09-15 DIAGNOSIS — E119 Type 2 diabetes mellitus without complications: Secondary | ICD-10-CM

## 2011-09-19 ENCOUNTER — Telehealth: Payer: Self-pay | Admitting: *Deleted

## 2011-09-19 ENCOUNTER — Other Ambulatory Visit: Payer: Self-pay | Admitting: Internal Medicine

## 2011-09-19 MED ORDER — CEPHALEXIN 500 MG PO CAPS: 500.0000 mg | ORAL_CAPSULE | Freq: Four times a day (QID) | ORAL | Status: AC

## 2011-09-19 NOTE — Telephone Encounter (Signed)
Pt. Has a swollen great toe with redness and pain...he thinks the toenail is infected.  Would like to see Dr. Lovell Sheehan.  Not sure if he has a fever?  No injury and no history of gout.

## 2011-09-19 NOTE — Telephone Encounter (Signed)
Keflex 500 mg 4 times a day for 7 days

## 2011-09-19 NOTE — Telephone Encounter (Signed)
Notified pt. On personal voice mail.

## 2011-09-19 NOTE — Telephone Encounter (Signed)
Keflex 500 mg 4 times a day for 7 days 

## 2011-09-20 LAB — BASIC METABOLIC PANEL
BUN: 16
Calcium: 9.1
Chloride: 103
Creatinine, Ser: 0.96
GFR calc Af Amer: >60
GFR calc non Af Amer: >60

## 2011-09-20 LAB — CBC
MCV: 81.6
Platelets: 298
RBC: 4.72
WBC: 6.6

## 2011-09-30 ENCOUNTER — Ambulatory Visit (INDEPENDENT_AMBULATORY_CARE_PROVIDER_SITE_OTHER): Payer: 59 | Admitting: Internal Medicine

## 2011-09-30 VITALS — BP 130/80 | HR 76 | Temp 98.2°F | Resp 16 | Ht 70.0 in | Wt 206.0 lb

## 2011-09-30 DIAGNOSIS — E785 Hyperlipidemia, unspecified: Secondary | ICD-10-CM

## 2011-09-30 DIAGNOSIS — E119 Type 2 diabetes mellitus without complications: Secondary | ICD-10-CM

## 2011-09-30 DIAGNOSIS — G250 Essential tremor: Secondary | ICD-10-CM

## 2011-09-30 DIAGNOSIS — G252 Other specified forms of tremor: Secondary | ICD-10-CM

## 2011-09-30 DIAGNOSIS — Z23 Encounter for immunization: Secondary | ICD-10-CM

## 2011-09-30 NOTE — Patient Instructions (Addendum)
Patient was instructed to continue all medications as prescribed. To stop at the checkout desk and schedule a followup appointment  

## 2011-12-26 ENCOUNTER — Encounter: Payer: Self-pay | Admitting: Internal Medicine

## 2011-12-26 NOTE — Progress Notes (Signed)
Subjective:    Patient ID: Isaiah Dunn, male    DOB: 1956/10/11, 55 y.o.   MRN: [id number]  HPI The patient is is a 55 year old white male who presents for followup of diabetes hypertension hyperlipidemia and benign essential tremor.  He states his blood sugars have been doing relatively well his weight has been stable and his blood pressure is stable he denies any chest pain shortness of breath PND or orthopnea at this time  Review of Systems  Constitutional: Negative for fever and fatigue.  HENT: Negative for hearing loss, congestion, neck pain and postnasal drip.   Eyes: Negative for discharge, redness and visual disturbance.  Respiratory: Negative for cough, shortness of breath and wheezing.   Cardiovascular: Negative for leg swelling.  Gastrointestinal: Negative for abdominal pain, constipation and abdominal distention.  Genitourinary: Negative for urgency and frequency.  Musculoskeletal: Negative for joint swelling and arthralgias.  Skin: Negative for color change and rash.  Neurological: Negative for weakness and light-headedness.  Hematological: Negative for adenopathy.  Psychiatric/Behavioral: Negative for behavioral problems.   Past Medical History  Diagnosis Date  . DIABETES MELLITUS, TYPE II 05/11/2007  . DIASTOLIC DYSFUNCTION 07/06/2010  . DYSPNEA 07/06/2010  . HERPES ZOSTER 07/17/2009  . HYPERLIPIDEMIA 05/11/2007  . OSTEOARTHRITIS 04/16/2008  . OSTEOARTHROSIS, LOCAL NOS, LOWER LEG 09/06/2007  . TREMOR, ESSENTIAL 09/06/2007    History   Social History  . Marital Status: Married    Spouse Name: N/A    Number of Children: N/A  . Years of Education: N/A   Occupational History  . Not on file.   Social History Main Topics  . Smoking status: Former Smoker    Quit date: 12/27/1999  . Smokeless tobacco: Never Used  . Alcohol Use: Yes     occasional  . Drug Use: No  . Sexually Active: Not on file   Other Topics Concern  . Not on file   Social History  Narrative  . No narrative on file    Past Surgical History  Procedure Date  . Total knee arthroplasty   . Rotator cuff repair   . Cervical fusion     No family history on file.  Allergies  Allergen Reactions  . Celecoxib     REACTION: unspecified  . Clindamycin Hcl     REACTION: unspecified  . Codeine Sulfate     REACTION: unspecified  . Erythromycin     REACTION: unspecified    Current Outpatient Prescriptions on File Prior to Visit  Medication Sig Dispense Refill  . aspirin 81 MG tablet Take 81 mg by mouth daily.        . benazepril (LOTENSIN) 5 MG tablet TAKE 1/2 TABLET EVERY DAY WITH GOAL TO TITRATE TO 1 TABLET ONCE DAILY  30 tablet  4  . CYMBALTA 60 MG capsule TAKE 1 CAPSULE BY MOUTH EVERY DAY  30 capsule  3  . diazepam (VALIUM) 5 MG tablet Take by mouth. 1/2 every 8hours as needed       . LIPITOR 10 MG tablet TAKE 1 TABLET EVERY DAY  30 tablet  9  . metFORMIN (GLUCOPHAGE-XR) 500 MG 24 hr tablet TAKE 2 TABLETS EVERY DAY  60 tablet  4    BP 130/80  Pulse 76  Temp 98.2 F (36.8 C)  Resp 16  Ht 5\' 10"  (1.778 m)  Wt 206 lb (93.441 kg)  BMI 29.56 kg/m2       Objective:   Physical Exam  Nursing note and vitals reviewed. Constitutional:  He appears well-developed and well-nourished.  HENT:  Head: Normocephalic and atraumatic.  Eyes: Conjunctivae are normal. Pupils are equal, round, and reactive to light.  Neck: Normal range of motion. Neck supple.  Cardiovascular: Normal rate and regular rhythm.   Pulmonary/Chest: Effort normal and breath sounds normal.  Abdominal: Soft. Bowel sounds are normal.          Assessment & Plan:  Patient has been doing well. He has lost weight he believes that this has positively affected his CBGs.  We will monitor her A1c now and prior to his return visit in 4 months I expect that the weight loss will also have a positive effect on his hyperlipidemia.  His essential tremor has been doing well and he does not wish to  increase or change the medications he is using for that at this time.  He has a history of hypertension with diastolic dysfunction and he shows no symptoms of decompensation he is stable on all of his current medications and refills were given as necessary

## 2012-01-05 ENCOUNTER — Ambulatory Visit: Payer: 59 | Admitting: Cardiology

## 2012-01-11 ENCOUNTER — Other Ambulatory Visit: Payer: Self-pay | Admitting: *Deleted

## 2012-01-11 MED ORDER — DIAZEPAM 5 MG PO TABS: 5.0000 mg | ORAL_TABLET | Freq: Three times a day (TID) | ORAL | Status: DC | PRN

## 2012-01-24 ENCOUNTER — Other Ambulatory Visit (INDEPENDENT_AMBULATORY_CARE_PROVIDER_SITE_OTHER): Payer: 59

## 2012-01-24 DIAGNOSIS — IMO0001 Reserved for inherently not codable concepts without codable children: Secondary | ICD-10-CM

## 2012-01-24 LAB — HEMOGLOBIN A1C: Hgb A1c MFr Bld: 6.4 % (ref 4.6–6.5)

## 2012-01-31 ENCOUNTER — Ambulatory Visit (INDEPENDENT_AMBULATORY_CARE_PROVIDER_SITE_OTHER): Payer: 59 | Admitting: Internal Medicine

## 2012-01-31 ENCOUNTER — Encounter: Payer: Self-pay | Admitting: Internal Medicine

## 2012-01-31 DIAGNOSIS — G252 Other specified forms of tremor: Secondary | ICD-10-CM

## 2012-01-31 DIAGNOSIS — G250 Essential tremor: Secondary | ICD-10-CM

## 2012-01-31 DIAGNOSIS — E119 Type 2 diabetes mellitus without complications: Secondary | ICD-10-CM

## 2012-01-31 DIAGNOSIS — Z0000 Encounter for general adult medical examination without abnormal findings: Secondary | ICD-10-CM

## 2012-01-31 DIAGNOSIS — I519 Heart disease, unspecified: Secondary | ICD-10-CM

## 2012-01-31 NOTE — Patient Instructions (Signed)
The patient is instructed to continue all medications as prescribed. Schedule followup with check out clerk upon leaving the clinic  

## 2012-01-31 NOTE — Progress Notes (Signed)
Subjective:    Patient ID: Isaiah Dunn, male    DOB: [birth year], 56 y.o.   MRN: [id number]  HPI Mood stable on the medications DM stable Goal of weight loss Needs Eye exam.     Review of Systems  Constitutional: Negative for fever and fatigue.  HENT: Negative for hearing loss, congestion, neck pain and postnasal drip.   Eyes: Negative for discharge, redness and visual disturbance.  Respiratory: Negative for cough, shortness of breath and wheezing.   Cardiovascular: Negative for leg swelling.  Gastrointestinal: Negative for abdominal pain, constipation and abdominal distention.  Genitourinary: Negative for urgency and frequency.  Musculoskeletal: Negative for joint swelling and arthralgias.  Skin: Negative for color change and rash.  Neurological: Negative for weakness and light-headedness.  Hematological: Negative for adenopathy.  Psychiatric/Behavioral: Negative for behavioral problems.   Past Medical History  Diagnosis Date  . DIABETES MELLITUS, TYPE II 05/11/2007  . DIASTOLIC DYSFUNCTION 07/06/2010  . DYSPNEA 07/06/2010  . HERPES ZOSTER 07/17/2009  . HYPERLIPIDEMIA 05/11/2007  . OSTEOARTHRITIS 04/16/2008  . OSTEOARTHROSIS, LOCAL NOS, LOWER LEG 09/06/2007  . TREMOR, ESSENTIAL 09/06/2007    History   Social History  . Marital Status: Married    Spouse Name: N/A    Number of Children: N/A  . Years of Education: N/A   Occupational History  . Not on file.   Social History Main Topics  . Smoking status: Former Smoker    Quit date: 12/27/1999  . Smokeless tobacco: Never Used  . Alcohol Use: Yes     occasional  . Drug Use: No  . Sexually Active: Not on file   Other Topics Concern  . Not on file   Social History Narrative  . No narrative on file    Past Surgical History  Procedure Date  . Total knee arthroplasty   . Rotator cuff repair   . Cervical fusion     No family history on file.  Allergies  Allergen Reactions  . Celecoxib     REACTION:  unspecified  . Clindamycin Hcl     REACTION: unspecified  . Codeine Sulfate     REACTION: unspecified  . Erythromycin     REACTION: unspecified    Current Outpatient Prescriptions on File Prior to Visit  Medication Sig Dispense Refill  . aspirin 81 MG tablet Take 81 mg by mouth daily.        . benazepril (LOTENSIN) 5 MG tablet TAKE 1/2 TABLET EVERY DAY WITH GOAL TO TITRATE TO 1 TABLET ONCE DAILY  30 tablet  4  . CYMBALTA 60 MG capsule TAKE 1 CAPSULE BY MOUTH EVERY DAY  30 capsule  3  . diazepam (VALIUM) 5 MG tablet Take 1 tablet (5 mg total) by mouth every 8 (eight) hours as needed for anxiety. 1/2 every 8hours as needed  30 tablet  3  . LIPITOR 10 MG tablet TAKE 1 TABLET EVERY DAY  30 tablet  9  . metFORMIN (GLUCOPHAGE-XR) 500 MG 24 hr tablet TAKE 2 TABLETS EVERY DAY  60 tablet  4    BP 132/78  Pulse 80  Temp 98.2 F (36.8 C)  Resp 16  Ht 5\' 10"  (1.778 m)  Wt 209 lb (94.802 kg)  BMI 29.99 kg/m2       Objective:   Physical Exam  Nursing note and vitals reviewed. Constitutional: He appears well-developed and well-nourished.  HENT:  Head: Normocephalic and atraumatic.  Eyes: Conjunctivae are normal. Pupils are equal, round, and reactive to light.  Neck: Normal range of motion. Neck supple.  Cardiovascular: Normal rate and regular rhythm.   Pulmonary/Chest: Effort normal and breath sounds normal.  Abdominal: Soft. Bowel sounds are normal.  Skin: Skin is warm and dry.     Diabetic foot exam:  Left: Reflexes 3+   Vibratory sensation normal  Proprioception normal  Sharp/dull discrimination normal  Filament test present Right: Reflexes 3+   Vibratory sensation normal  Proprioception normal  Sharp/dull discrimination normal  Filament test present  Assessment & Plan:  Weight stable DM stable CBG's Needs eye exam Foot exam normal Weight management reviewed

## 2012-02-01 ENCOUNTER — Encounter: Payer: Self-pay | Admitting: Internal Medicine

## 2012-02-09 ENCOUNTER — Other Ambulatory Visit: Payer: Self-pay | Admitting: *Deleted

## 2012-02-09 MED ORDER — METFORMIN HCL ER 500 MG PO TB24: 1000.0000 mg | ORAL_TABLET | Freq: Every day | ORAL | Status: DC

## 2012-02-24 ENCOUNTER — Ambulatory Visit (INDEPENDENT_AMBULATORY_CARE_PROVIDER_SITE_OTHER): Payer: BC Managed Care – PPO | Admitting: Cardiology

## 2012-02-24 ENCOUNTER — Encounter: Payer: Self-pay | Admitting: Cardiology

## 2012-02-24 DIAGNOSIS — I10 Essential (primary) hypertension: Secondary | ICD-10-CM

## 2012-02-24 DIAGNOSIS — E785 Hyperlipidemia, unspecified: Secondary | ICD-10-CM

## 2012-02-24 DIAGNOSIS — E7800 Pure hypercholesterolemia, unspecified: Secondary | ICD-10-CM

## 2012-02-24 DIAGNOSIS — I519 Heart disease, unspecified: Secondary | ICD-10-CM

## 2012-02-24 NOTE — Patient Instructions (Signed)
Your physician wants you to follow-up appt as needed with Dr Shirlee Latch.

## 2012-02-24 NOTE — Progress Notes (Signed)
PCP: Dr. Lovell Sheehan  56 yo with history of hyperlipidemia and hypertension presented initially for evaluation of chest pain and abnormal myoview in 2011. He had a left heart cath done at that time showing minimal coronary disease. Since then, he continues to get occasional mild atypical (nonexertional) chest pain, most often associated with anxiety. He still has some exertional dyspnea which tends to come with more strenuous activity. He rides his stationary bike 3 days a week and is mildly winded when he finishes.   ECG: NSR, inferior T wave inversions  Labs (4/11): LDL 70, HDL 49, K 4.4, creatinine 1.1, TSH normal  Labs (6/11): K 4.9, creatinine 1.1 Labs (6/12): K 4.9, creatinine 1.1, LDL 76  Past Medical History:  1. Hyperlipidemia  2. Diabetes mellitus, type II x 14 years  3. Osteoarthritis  4. Rotator cuff tear left shoulder s/p arthroscopic surgery 5/11  5. Depression  6. Essential tremor on propranolol  7. Diabetic nephropathy (proteinuria)  8. Chest pain: ETT-myoview (6/11): 5'33", nonspecific ST-T changes, chest pain + severe dyspnea, hypotensive BP response, EF 61%, normal perfusion images. Left heart cath (radial approach) 6/11 with minimal coronary disease (25% mid RCA stenosis) and LVEDP 23 mmHg.  9. Exertional dyspnea: Echo (7/11) with EF 55%, grade II diastolic dysfunction, normal RV.  Family History:  HTN, hyperlipidemia, diabetes.  No known premature CAD   Social History:  Married, 1 son  Former Games developer (quit 2002)  Lives in [place]  Works in Radio broadcast assistant  Current Outpatient Prescriptions  Medication Sig Dispense Refill  . aspirin 81 MG tablet Take 81 mg by mouth daily.        . benazepril (LOTENSIN) 5 MG tablet TAKE 1/2 TABLET EVERY DAY WITH GOAL TO TITRATE TO 1 TABLET ONCE DAILY  30 tablet  4  . CYMBALTA 60 MG capsule TAKE 1 CAPSULE BY MOUTH EVERY DAY  30 capsule  3  . diazepam (VALIUM) 5 MG tablet Take 1 tablet (5 mg total) by mouth every 8 (eight)  hours as needed for anxiety. 1/2 every 8hours as needed  30 tablet  3  . LIPITOR 10 MG tablet TAKE 1 TABLET EVERY DAY  30 tablet  9  . metFORMIN (GLUCOPHAGE-XR) 500 MG 24 hr tablet Take 2 tablets (1,000 mg total) by mouth daily with breakfast.  60 tablet  11    BP 118/78  Pulse 68  Ht 5\' 11"  (1.803 m)  Wt 210 lb (95.255 kg)  BMI 29.29 kg/m2 General: NAD Neck: No JVD, no thyromegaly or thyroid nodule.  Lungs: Clear to auscultation bilaterally with normal respiratory effort. CV: Nondisplaced PMI.  Heart regular S1/S2, no S3/S4, no murmur.  No peripheral edema.  No carotid bruit.  Normal pedal pulses.  Abdomen: Soft, nontender, no hepatosplenomegaly, no distention.  Neurologic: Alert and oriented x 3.  Psych: Normal affect. Extremities: No clubbing or cyanosis.

## 2012-02-26 NOTE — Assessment & Plan Note (Signed)
Stable mild exertional dyspnea.  Last echo in 7/11 showed moderate diastolic dysfunction.

## 2012-02-26 NOTE — Assessment & Plan Note (Signed)
Would like to see LDL at least < 100 with diabetes.

## 2012-04-06 ENCOUNTER — Telehealth: Payer: Self-pay | Admitting: *Deleted

## 2012-04-06 NOTE — Telephone Encounter (Signed)
Pt is asking for samles of Cymbalta, please.

## 2012-04-06 NOTE — Telephone Encounter (Signed)
Samples up front for pt.

## 2012-05-03 ENCOUNTER — Other Ambulatory Visit: Payer: Self-pay | Admitting: Internal Medicine

## 2012-06-03 ENCOUNTER — Other Ambulatory Visit: Payer: Self-pay | Admitting: Internal Medicine

## 2012-06-07 ENCOUNTER — Other Ambulatory Visit (INDEPENDENT_AMBULATORY_CARE_PROVIDER_SITE_OTHER): Payer: 59

## 2012-06-07 DIAGNOSIS — E119 Type 2 diabetes mellitus without complications: Secondary | ICD-10-CM

## 2012-06-07 DIAGNOSIS — Z0000 Encounter for general adult medical examination without abnormal findings: Secondary | ICD-10-CM

## 2012-06-07 LAB — HEPATIC FUNCTION PANEL
AST: 25 U/L (ref 0–37)
Albumin: 4 g/dL (ref 3.5–5.2)
Alkaline Phosphatase: 83 U/L (ref 39–117)
Bilirubin, Direct: 0.1 mg/dL (ref 0.0–0.3)
Total Bilirubin: 1.3 mg/dL — ABNORMAL HIGH (ref 0.3–1.2)

## 2012-06-07 LAB — CBC WITH DIFFERENTIAL/PLATELET
Basophils Absolute: 0 10*3/uL (ref 0.0–0.1)
Basophils Relative: 0.6 % (ref 0.0–3.0)
Eosinophils Absolute: 0.1 10*3/uL (ref 0.0–0.7)
Hemoglobin: 13.9 g/dL (ref 13.0–17.0)
Lymphocytes Relative: 41.2 % (ref 12.0–46.0)
MCHC: 32.8 g/dL (ref 30.0–36.0)
Monocytes Relative: 8.6 % (ref 3.0–12.0)
Neutrophils Relative %: 47.4 % (ref 43.0–77.0)
RBC: 4.88 Mil/uL (ref 4.22–5.81)
RDW: 13.5 % (ref 11.5–14.6)

## 2012-06-07 LAB — LIPID PANEL
LDL Cholesterol: 72 mg/dL (ref 0–99)
Total CHOL/HDL Ratio: 3
Triglycerides: 124 mg/dL (ref 0.0–149.0)

## 2012-06-07 LAB — BASIC METABOLIC PANEL
CO2: 22 mEq/L (ref 19–32)
Calcium: 8.9 mg/dL (ref 8.4–10.5)
Chloride: 105 mEq/L (ref 96–112)
Creatinine, Ser: 0.9 mg/dL (ref 0.4–1.5)
Glucose, Bld: 118 mg/dL — ABNORMAL HIGH (ref 70–99)

## 2012-06-07 LAB — MICROALBUMIN / CREATININE URINE RATIO
Creatinine,U: 258.5 mg/dL
Microalb, Ur: 3 mg/dL — ABNORMAL HIGH (ref 0.0–1.9)

## 2012-06-07 LAB — POCT URINALYSIS DIPSTICK
Blood, UA: NEGATIVE
Ketones, UA: NEGATIVE
Spec Grav, UA: >=1.03
Urobilinogen, UA: 0.2
pH, UA: 5.5

## 2012-06-07 LAB — TSH: TSH: 0.94 u[IU]/mL (ref 0.35–5.50)

## 2012-06-07 LAB — HEMOGLOBIN A1C: Hgb A1c MFr Bld: 6.3 % (ref 4.6–6.5)

## 2012-06-14 ENCOUNTER — Ambulatory Visit: Payer: BC Managed Care – PPO | Admitting: Internal Medicine

## 2012-06-14 ENCOUNTER — Encounter: Payer: Self-pay | Admitting: Internal Medicine

## 2012-06-14 ENCOUNTER — Ambulatory Visit (INDEPENDENT_AMBULATORY_CARE_PROVIDER_SITE_OTHER): Payer: BC Managed Care – PPO | Admitting: Internal Medicine

## 2012-06-14 VITALS — BP 136/84 | HR 72 | Temp 98.2°F | Resp 16 | Ht 71.0 in | Wt 210.0 lb

## 2012-06-14 DIAGNOSIS — E291 Testicular hypofunction: Secondary | ICD-10-CM

## 2012-06-14 DIAGNOSIS — IMO0001 Reserved for inherently not codable concepts without codable children: Secondary | ICD-10-CM

## 2012-06-14 DIAGNOSIS — Z0000 Encounter for general adult medical examination without abnormal findings: Secondary | ICD-10-CM

## 2012-06-14 NOTE — Patient Instructions (Addendum)
The patient is instructed to continue all medications as prescribed. Schedule followup with check out clerk upon leaving the clinic  

## 2012-06-14 NOTE — Progress Notes (Signed)
  Subjective:    Patient ID: Isaiah Dunn, male    DOB: 1956-07-06, 56 y.o.   MRN: [id number]  HPI CPX Hx of diabetes Patient states his blood sugars have been reasonably controlled his weight has been stable he is exercising on a regular basis.  The patient admits to sexual dysfunction he is not sure whether this comes from increased stress 22 having dropped from a to incompetent mitral one income family of whether or not this represents a change and his hormonal balance.  We discussed measuring his testosterone and stress management.  The patient is also followed for hyperlipidemia on a statin he states that he does have some myalgias on a statin but he does understand the importance of remaining on this drug in lieu of his diabetes .  Review of Systems  Constitutional: Negative for fever and fatigue.  HENT: Negative for hearing loss, congestion, neck pain and postnasal drip.   Eyes: Negative for discharge, redness and visual disturbance.  Respiratory: Negative for cough, shortness of breath and wheezing.   Cardiovascular: Negative for leg swelling.  Gastrointestinal: Negative for abdominal pain, constipation and abdominal distention.  Genitourinary: Negative for urgency and frequency.  Musculoskeletal: Negative for joint swelling and arthralgias.  Skin: Negative for color change and rash.  Neurological: Negative for weakness and light-headedness.  Hematological: Negative for adenopathy.  Psychiatric/Behavioral: Negative for behavioral problems.       Objective:   Physical Exam  Nursing note and vitals reviewed. Constitutional: He is oriented to person, place, and time. He appears well-developed and well-nourished.  HENT:  Head: Normocephalic and atraumatic.  Eyes: Conjunctivae are normal. Pupils are equal, round, and reactive to light.  Neck: Normal range of motion. Neck supple.  Cardiovascular: Normal rate and regular rhythm.   Pulmonary/Chest: Effort normal and breath  sounds normal.  Abdominal: Soft. Bowel sounds are normal.  Genitourinary: Rectum normal and prostate normal.  Musculoskeletal: Normal range of motion.  Neurological: He is alert and oriented to person, place, and time.  Skin: Skin is warm and dry.  Psychiatric: He has a normal mood and affect. His behavior is normal.          Assessment & Plan:   Patient presents for yearly preventative medicine examination.   all immunizations and health maintenance protocols were reviewed with the patient and they are up to date with these protocols.   screening laboratory values were reviewed with the patient including screening of hyperlipidemia PSA renal function and hepatic function.   There medications past medical history social history problem list and allergies were reviewed in detail.   Goals were established with regard to weight loss exercise diet in compliance with medications Cholesterol is at goal on current statin.  Continue Lipitor at its current dose.  Blood pressure stable on Lotensin diabetes stable with an A1c in therapeutic range.  Continue metformin.  Mood disorder of Cymbalta stable.  Sexual dysfunction monitor his testosterone

## 2012-06-18 ENCOUNTER — Encounter: Payer: 59 | Admitting: Internal Medicine

## 2012-06-20 ENCOUNTER — Other Ambulatory Visit: Payer: Self-pay | Admitting: *Deleted

## 2012-06-20 MED ORDER — TESTOSTERONE 12.5 MG/ACT (1%) TD GEL: 1.0000 "application " | TRANSDERMAL | Status: DC

## 2012-10-10 ENCOUNTER — Other Ambulatory Visit (INDEPENDENT_AMBULATORY_CARE_PROVIDER_SITE_OTHER): Payer: BC Managed Care – PPO

## 2012-10-10 DIAGNOSIS — IMO0001 Reserved for inherently not codable concepts without codable children: Secondary | ICD-10-CM

## 2012-10-17 ENCOUNTER — Ambulatory Visit (INDEPENDENT_AMBULATORY_CARE_PROVIDER_SITE_OTHER): Payer: BC Managed Care – PPO | Admitting: Internal Medicine

## 2012-10-17 ENCOUNTER — Encounter: Payer: Self-pay | Admitting: Internal Medicine

## 2012-10-17 VITALS — BP 110/80 | HR 72 | Temp 98.6°F | Resp 16 | Ht 71.0 in | Wt 212.0 lb

## 2012-10-17 DIAGNOSIS — I519 Heart disease, unspecified: Secondary | ICD-10-CM

## 2012-10-17 DIAGNOSIS — E291 Testicular hypofunction: Secondary | ICD-10-CM

## 2012-10-17 DIAGNOSIS — E119 Type 2 diabetes mellitus without complications: Secondary | ICD-10-CM

## 2012-10-17 NOTE — Progress Notes (Signed)
Subjective:    Patient ID: Isaiah Dunn, male    DOB: Jul 03, 1956, 56 y.o.   MRN: [id number]  HPI Testosterone has helped but has noted some cramps in muscles Has increased pain in shoulder that has responded to IBU DM has been stable Has increased energy Good blood pressure   Review of Systems  Constitutional: Negative for fever and fatigue.  HENT: Negative for hearing loss, congestion, neck pain and postnasal drip.   Eyes: Negative for discharge, redness and visual disturbance.  Respiratory: Negative for cough, shortness of breath and wheezing.   Cardiovascular: Negative for leg swelling.  Gastrointestinal: Negative for abdominal pain, constipation and abdominal distention.  Genitourinary: Negative for urgency and frequency.  Musculoskeletal: Negative for joint swelling and arthralgias.  Skin: Negative for color change and rash.  Neurological: Negative for weakness and light-headedness.  Hematological: Negative for adenopathy.  Psychiatric/Behavioral: Negative for behavioral problems.   Past Medical History  Diagnosis Date  . DIABETES MELLITUS, TYPE II 05/11/2007  . DIASTOLIC DYSFUNCTION 07/06/2010  . DYSPNEA 07/06/2010  . HERPES ZOSTER 07/17/2009  . HYPERLIPIDEMIA 05/11/2007  . OSTEOARTHRITIS 04/16/2008  . OSTEOARTHROSIS, LOCAL NOS, LOWER LEG 09/06/2007  . TREMOR, ESSENTIAL 09/06/2007    History   Social History  . Marital Status: Married    Spouse Name: N/A    Number of Children: N/A  . Years of Education: N/A   Occupational History  . Not on file.   Social History Main Topics  . Smoking status: Former Smoker    Quit date: 12/27/1999  . Smokeless tobacco: Never Used  . Alcohol Use: Yes     occasional  . Drug Use: No  . Sexually Active: Not on file   Other Topics Concern  . Not on file   Social History Narrative  . No narrative on file    Past Surgical History  Procedure Date  . Total knee arthroplasty   . Rotator cuff repair   . Cervical fusion      Family History  Problem Relation Age of Onset  . Arthritis Mother   . Hypertension Mother   . Hyperlipidemia Mother   . Hypertension Father   . Hyperlipidemia Father     Allergies  Allergen Reactions  . Celecoxib     REACTION: unspecified  . Clindamycin Hcl     REACTION: unspecified  . Codeine Sulfate     REACTION: unspecified  . Erythromycin     REACTION: unspecified    Current Outpatient Prescriptions on File Prior to Visit  Medication Sig Dispense Refill  . aspirin 81 MG tablet Take 81 mg by mouth daily.        Marland Kitchen atorvastatin (LIPITOR) 10 MG tablet TAKE 1 TABLET EVERY DAY  30 tablet  9  . benazepril (LOTENSIN) 5 MG tablet TAKE 1/2 TABLET EVERY DAY WITH GOAL TO TITRATE TO 1 TABLET ONCE DAILY  30 tablet  4  . CYMBALTA 60 MG capsule TAKE 1 CAPSULE BY MOUTH EVERY DAY  30 capsule  3  . diazepam (VALIUM) 5 MG tablet Take 1 tablet (5 mg total) by mouth every 8 (eight) hours as needed for anxiety. 1/2 every 8hours as needed  30 tablet  3  . metFORMIN (GLUCOPHAGE-XR) 500 MG 24 hr tablet Take 2 tablets (1,000 mg total) by mouth daily with breakfast.  60 tablet  11  . Testosterone (ANDROGEL PUMP) 1.25 GM/ACT (1%) GEL Place 1 application onto the skin 1 day or 1 dose.  75 g  5  BP 110/80  Pulse 72  Temp 98.6 F (37 C)  Resp 16  Ht 5\' 11"  (1.803 m)  Wt 212 lb (96.163 kg)  BMI 29.57 kg/m2        Objective:   Physical Exam  Nursing note and vitals reviewed. Constitutional: He appears well-developed and well-nourished.  HENT:  Head: Normocephalic and atraumatic.  Eyes: Conjunctivae normal are normal. Pupils are equal, round, and reactive to light.  Neck: Normal range of motion. Neck supple.  Cardiovascular: Normal rate and regular rhythm.   Pulmonary/Chest: Effort normal and breath sounds normal.  Abdominal: Soft. Bowel sounds are normal.  Musculoskeletal: He exhibits edema and tenderness.       Spasm of trapezius on left          Assessment & Plan:  DM  stable with good A1C Weight slight increase but has been exercising Blood pressure stable Tremor stable... Possible referral for movement disorder

## 2012-11-13 ENCOUNTER — Other Ambulatory Visit: Payer: Self-pay | Admitting: Internal Medicine

## 2012-12-04 ENCOUNTER — Encounter: Payer: Self-pay | Admitting: Internal Medicine

## 2012-12-05 ENCOUNTER — Encounter: Payer: Self-pay | Admitting: Internal Medicine

## 2012-12-31 ENCOUNTER — Encounter: Payer: Self-pay | Admitting: Internal Medicine

## 2013-01-02 ENCOUNTER — Telehealth: Payer: Self-pay | Admitting: *Deleted

## 2013-01-02 ENCOUNTER — Encounter: Payer: Self-pay | Admitting: Internal Medicine

## 2013-01-02 NOTE — Telephone Encounter (Signed)
Sent!

## 2013-02-05 ENCOUNTER — Encounter: Payer: Self-pay | Admitting: Internal Medicine

## 2013-02-05 ENCOUNTER — Ambulatory Visit (INDEPENDENT_AMBULATORY_CARE_PROVIDER_SITE_OTHER): Payer: BC Managed Care – PPO | Admitting: Internal Medicine

## 2013-02-05 ENCOUNTER — Telehealth: Payer: Self-pay | Admitting: Internal Medicine

## 2013-02-05 VITALS — BP 140/86 | HR 74 | Temp 97.8°F | Resp 18 | Wt 218.0 lb

## 2013-02-05 DIAGNOSIS — M549 Dorsalgia, unspecified: Secondary | ICD-10-CM

## 2013-02-05 DIAGNOSIS — M1990 Unspecified osteoarthritis, unspecified site: Secondary | ICD-10-CM

## 2013-02-05 DIAGNOSIS — E119 Type 2 diabetes mellitus without complications: Secondary | ICD-10-CM

## 2013-02-05 MED ORDER — TRAMADOL HCL 50 MG PO TABS: 50.0000 mg | ORAL_TABLET | Freq: Three times a day (TID) | ORAL | Status: DC | PRN

## 2013-02-05 MED ORDER — METHYLPREDNISOLONE ACETATE 80 MG/ML IJ SUSP
80.0000 mg | Freq: Once | INTRAMUSCULAR | Status: AC
  Administered 2013-02-05: 80 mg via INTRAMUSCULAR

## 2013-02-05 NOTE — Progress Notes (Signed)
Subjective:    Patient ID: Isaiah Dunn, male    DOB: July 17, 1956, 57 y.o.   MRN: [id number]  HPI   57 year old patient who has a history of diabetes and hypertension. He presents with a four-day history of acute right lumbar pain. He describes the pain as a burning quality with some radiation down the right leg to the left great toe  Past Medical History  Diagnosis Date  . DIABETES MELLITUS, TYPE II 05/11/2007  . DIASTOLIC DYSFUNCTION 07/06/2010  . DYSPNEA 07/06/2010  . HERPES ZOSTER 07/17/2009  . HYPERLIPIDEMIA 05/11/2007  . OSTEOARTHRITIS 04/16/2008  . OSTEOARTHROSIS, LOCAL NOS, LOWER LEG 09/06/2007  . TREMOR, ESSENTIAL 09/06/2007    History   Social History  . Marital Status: Married    Spouse Name: N/A    Number of Children: N/A  . Years of Education: N/A   Occupational History  . Not on file.   Social History Main Topics  . Smoking status: Former Smoker    Quit date: 12/27/1999  . Smokeless tobacco: Never Used  . Alcohol Use: Yes     Comment: occasional  . Drug Use: No  . Sexually Active: Not on file   Other Topics Concern  . Not on file   Social History Narrative  . No narrative on file    Past Surgical History  Procedure Laterality Date  . Total knee arthroplasty    . Rotator cuff repair    . Cervical fusion      Family History  Problem Relation Age of Onset  . Arthritis Mother   . Hypertension Mother   . Hyperlipidemia Mother   . Hypertension Father   . Hyperlipidemia Father     Allergies  Allergen Reactions  . Celecoxib     REACTION: unspecified  . Clindamycin Hcl     REACTION: unspecified  . Codeine Sulfate     REACTION: unspecified  . Erythromycin     REACTION: unspecified    Current Outpatient Prescriptions on File Prior to Visit  Medication Sig Dispense Refill  . aspirin 81 MG tablet Take 81 mg by mouth daily.        Marland Kitchen atorvastatin (LIPITOR) 10 MG tablet TAKE 1 TABLET EVERY DAY  30 tablet  9  . benazepril (LOTENSIN) 5 MG tablet  TAKE 1/2 TABLET EVERY DAY WITH GOAL TO TITRATE TO 1 TABLET ONCE DAILY  30 tablet  4  . CYMBALTA 60 MG capsule TAKE 1 CAPSULE BY MOUTH EVERY DAY  30 capsule  3  . diazepam (VALIUM) 5 MG tablet TAKE 1/2 TABLET EVERY 8 HOURS AS NEEDED  45 tablet  2  . metFORMIN (GLUCOPHAGE-XR) 500 MG 24 hr tablet Take 2 tablets (1,000 mg total) by mouth daily with breakfast.  60 tablet  11  . Testosterone (ANDROGEL PUMP) 1.25 GM/ACT (1%) GEL Place 1 application onto the skin 1 day or 1 dose.  75 g  5   No current facility-administered medications on file prior to visit.    BP 140/86  Pulse 74  Temp(Src) 97.8 F (36.6 C) (Oral)  Resp 18  Wt 218 lb (98.884 kg)  BMI 30.42 kg/m2  SpO2 98%       Review of Systems  Musculoskeletal: Positive for back pain.       Objective:   Physical Exam  Constitutional: He appears well-developed and well-nourished. No distress.  Musculoskeletal:  Mild discomfort with transfer and lying  supine do to acute back pain  Straight leg test on the  right did tend to aggravate the lumbar pain  Patellar and Achilles reflexes intact Normal plantar and dorsiflexion of the foot          Assessment & Plan:    acute back pain with sciatica. Sounds like an L4 radiculopathy. Patient is neurologically intact. Will  treat with Depo-Medrol rest and warm compresses as well as analgesics. He will call if there is any clinical deterioration or he fails to improve

## 2013-02-05 NOTE — Telephone Encounter (Signed)
Patient Information:  Caller Name: Kaleel  Phone: [phone]  Patient: Isaiah Dunn, Isaiah Dunn  Gender: Male  DOB: 02-02-1956  Age: 57 Years  PCP: Darryll Capers (Adults only)  Office Follow Up:  Does the office need to follow up with this patient?: No  Instructions For The Office: N/A  RN Note:  Pain to lower back toward right upper buttocks going down into right hip. States that pain infrequently radiates into the groin area. States tingling to right foot. No appointments available with Dr. Lovell Sheehan or Adline Mango.  Symptoms  Reason For Call & Symptoms: Backache  Reviewed Health History In EMR: Yes  Reviewed Medications In EMR: Yes  Reviewed Allergies In EMR: Yes  Reviewed Surgeries / Procedures: Yes  Date of Onset of Symptoms: 02/01/2013  Treatments Tried: Ice over weekend then heat, Ibuprofen  Treatments Tried Worked: No  Guideline(s) Used:  Back Pain  Disposition Per Guideline:   See Today in Office  Reason For Disposition Reached:   Tingling or numbness in the legs or feet  Advice Given:  N/A  Appointment Scheduled:  02/05/2013 10:45:00 Appointment Scheduled Provider:  Eleonore Chiquito (Family Practice)

## 2013-02-05 NOTE — Patient Instructions (Addendum)
Most patients with low back pain will improve with time over the next two to 6 weeks.  Keep active but avoid any activities that cause pain.  Apply moist heat to the low back area several times daily.Back Pain, Adult Low back pain is very common. About 1 in 5 people have back pain.The cause of low back pain is rarely dangerous. The pain often gets better over time.About half of people with a sudden onset of back pain feel better in just 2 weeks. About 8 in 10 people feel better by 6 weeks.  CAUSES Some common causes of back pain include:  Strain of the muscles or ligaments supporting the spine.  Wear and tear (degeneration) of the spinal discs.  Arthritis.  Direct injury to the back. DIAGNOSIS Most of the time, the direct cause of low back pain is not known.However, back pain can be treated effectively even when the exact cause of the pain is unknown.Answering your caregiver's questions about your overall health and symptoms is one of the most accurate ways to make sure the cause of your pain is not dangerous. If your caregiver needs more information, he or she may order lab work or imaging tests (X-rays or MRIs).However, even if imaging tests show changes in your back, this usually does not require surgery. HOME CARE INSTRUCTIONS For many people, back pain returns.Since low back pain is rarely dangerous, it is often a condition that people can learn to [facility] their own.   Remain active. It is stressful on the back to sit or stand in one place. Do not sit, drive, or stand in one place for more than 30 minutes at a time. Take short walks on level surfaces as soon as pain allows.Try to increase the length of time you walk each day.  Do not stay in bed.Resting more than 1 or 2 days can delay your recovery.  Do not avoid exercise or work.Your body is made to move.It is not dangerous to be active, even though your back may hurt.Your back will likely heal faster if you return to being  active before your pain is gone.  Pay attention to your body when you bend and lift. Many people have less discomfortwhen lifting if they bend their knees, keep the load close to their bodies,and avoid twisting. Often, the most comfortable positions are those that put less stress on your recovering back.  Find a comfortable position to sleep. Use a firm mattress and lie on your side with your knees slightly bent. If you lie on your back, put a pillow under your knees.  Only take over-the-counter or prescription medicines as directed by your caregiver. Over-the-counter medicines to reduce pain and inflammation are often the most helpful.Your caregiver may prescribe muscle relaxant drugs.These medicines help dull your pain so you can more quickly return to your normal activities and healthy exercise.  Put ice on the injured area.  Put ice in a plastic bag.  Place a towel between your skin and the bag.  Leave the ice on for 15 to 20 minutes, 3 to 4 times a day for the first 2 to 3 days. After that, ice and heat may be alternated to reduce pain and spasms.  Ask your caregiver about trying back exercises and gentle massage. This may be of some benefit.  Avoid feeling anxious or stressed.Stress increases muscle tension and can worsen back pain.It is important to recognize when you are anxious or stressed and learn ways to manage it.Exercise is a  great option. SEEK MEDICAL CARE IF:  You have pain that is not relieved with rest or medicine.  You have pain that does not improve in 1 week.  You have new symptoms.  You are generally not feeling well. SEEK IMMEDIATE MEDICAL CARE IF:   You have pain that radiates from your back into your legs.  You develop new bowel or bladder control problems.  You have unusual weakness or numbness in your arms or legs.  You develop nausea or vomiting.  You develop abdominal pain.  You feel faint. Document Released: 12/12/2005 Document Revised:  06/12/2012 Document Reviewed: 05/02/2011 [facility] Patient Information 2013 [place], [place].

## 2013-02-14 ENCOUNTER — Other Ambulatory Visit: Payer: Self-pay | Admitting: Internal Medicine

## 2013-02-21 ENCOUNTER — Other Ambulatory Visit: Payer: Self-pay | Admitting: Internal Medicine

## 2013-04-05 ENCOUNTER — Other Ambulatory Visit: Payer: Self-pay | Admitting: Internal Medicine

## 2013-04-18 ENCOUNTER — Other Ambulatory Visit: Payer: Self-pay | Admitting: Internal Medicine

## 2013-05-10 ENCOUNTER — Other Ambulatory Visit (INDEPENDENT_AMBULATORY_CARE_PROVIDER_SITE_OTHER): Payer: BC Managed Care – PPO

## 2013-05-10 DIAGNOSIS — Z0000 Encounter for general adult medical examination without abnormal findings: Secondary | ICD-10-CM

## 2013-05-10 LAB — BASIC METABOLIC PANEL
BUN: 16 mg/dL (ref 6–23)
CO2: 30 mEq/L (ref 19–32)
Chloride: 106 mEq/L (ref 96–112)
Glucose, Bld: 110 mg/dL — ABNORMAL HIGH (ref 70–99)
Potassium: 5.4 mEq/L — ABNORMAL HIGH (ref 3.5–5.1)

## 2013-05-10 LAB — CBC WITH DIFFERENTIAL/PLATELET
Eosinophils Absolute: 0.1 10*3/uL (ref 0.0–0.7)
Eosinophils Relative: 1.7 % (ref 0.0–5.0)
HCT: 37.2 % — ABNORMAL LOW (ref 39.0–52.0)
Lymphs Abs: 1.9 10*3/uL (ref 0.7–4.0)
MCHC: 33 g/dL (ref 30.0–36.0)
MCV: 80.1 fl (ref 78.0–100.0)
Monocytes Absolute: 0.7 10*3/uL (ref 0.1–1.0)
Platelets: 280 10*3/uL (ref 150.0–400.0)
RDW: 14.8 % — ABNORMAL HIGH (ref 11.5–14.6)
WBC: 5.3 10*3/uL (ref 4.5–10.5)

## 2013-05-10 LAB — TSH: TSH: 1.29 u[IU]/mL (ref 0.35–5.50)

## 2013-05-10 LAB — LIPID PANEL
HDL: 36.6 mg/dL — ABNORMAL LOW (ref >39.00)
LDL Cholesterol: 73 mg/dL (ref 0–99)
Total CHOL/HDL Ratio: 4
Triglycerides: 102 mg/dL (ref 0.0–149.0)

## 2013-05-10 LAB — POCT URINALYSIS DIPSTICK
Blood, UA: NEGATIVE
Protein, UA: NEGATIVE
Spec Grav, UA: 1.025
Urobilinogen, UA: 0.2

## 2013-05-10 LAB — MICROALBUMIN / CREATININE URINE RATIO: Microalb, Ur: 0.4 mg/dL (ref 0.0–1.9)

## 2013-05-10 LAB — HEPATIC FUNCTION PANEL
ALT: 32 U/L (ref 0–53)
Total Bilirubin: 1 mg/dL (ref 0.3–1.2)

## 2013-05-10 LAB — HEMOGLOBIN A1C: Hgb A1c MFr Bld: 6.6 % — ABNORMAL HIGH (ref 4.6–6.5)

## 2013-05-17 ENCOUNTER — Encounter: Payer: Self-pay | Admitting: Internal Medicine

## 2013-05-17 ENCOUNTER — Ambulatory Visit (INDEPENDENT_AMBULATORY_CARE_PROVIDER_SITE_OTHER): Payer: BC Managed Care – PPO | Admitting: Internal Medicine

## 2013-05-17 VITALS — BP 120/80 | HR 76 | Temp 98.1°F | Ht 69.0 in | Wt 211.0 lb

## 2013-05-17 DIAGNOSIS — G250 Essential tremor: Secondary | ICD-10-CM

## 2013-05-17 DIAGNOSIS — E119 Type 2 diabetes mellitus without complications: Secondary | ICD-10-CM

## 2013-05-17 DIAGNOSIS — I10 Essential (primary) hypertension: Secondary | ICD-10-CM

## 2013-05-17 DIAGNOSIS — Z0000 Encounter for general adult medical examination without abnormal findings: Secondary | ICD-10-CM

## 2013-05-17 DIAGNOSIS — G252 Other specified forms of tremor: Secondary | ICD-10-CM

## 2013-05-17 NOTE — Patient Instructions (Signed)
The patient is instructed to continue all medications as prescribed. Schedule followup with check out clerk upon leaving the clinic  

## 2013-05-17 NOTE — Progress Notes (Signed)
  Subjective:    Patient ID: Isaiah Dunn, male    DOB: 1956-09-23, 57 y.o.   MRN: [id number]  HPI Gained weight Gave blood April 5th Moderate constipation  BMI above thirty  Review of Systems  Constitutional: Negative for fever and fatigue.  HENT: Negative for hearing loss, congestion, neck pain and postnasal drip.   Eyes: Negative for discharge, redness and visual disturbance.  Respiratory: Negative for cough, shortness of breath and wheezing.   Cardiovascular: Negative for leg swelling.  Gastrointestinal: Negative for abdominal pain, constipation and abdominal distention.  Genitourinary: Negative for urgency and frequency.  Musculoskeletal: Negative for joint swelling and arthralgias.  Skin: Negative for color change and rash.  Neurological: Negative for weakness and light-headedness.  Hematological: Negative for adenopathy.  Psychiatric/Behavioral: Negative for behavioral problems.       The patient is instructed to continue all medications as prescribed. Schedule followup with check out clerk upon leaving the clinic  Objective:   Physical Exam  Nursing note and vitals reviewed. Constitutional: He is oriented to person, place, and time. He appears well-developed and well-nourished.  Weight gain  HENT:  Head: Normocephalic and atraumatic.  Eyes: Conjunctivae are normal. Pupils are equal, round, and reactive to light.  Neck: Normal range of motion. Neck supple.  Cardiovascular: Normal rate and regular rhythm.   Pulmonary/Chest: Effort normal and breath sounds normal.  Abdominal: Soft. Bowel sounds are normal.  Genitourinary:  enarged tender prostate  Musculoskeletal: Normal range of motion.  Neurological: He is alert and oriented to person, place, and time.  Skin: Skin is warm and dry.  Psychiatric: He has a normal mood and affect. His behavior is normal.          Assessment & Plan:   Patient presents for yearly preventative medicine examination.   all  immunizations and health maintenance protocols were reviewed with the patient and they are up to date with these protocols.   screening laboratory values were reviewed with the patient including screening of hyperlipidemia PSA renal function and hepatic function.   There medications past medical history social history problem list and allergies were reviewed in detail.   Goals were established with regard to weight loss exercise diet in compliance with medications   Weight gain and constipation with loss of DM controll  Goal setting and deit recomendtions

## 2013-07-17 ENCOUNTER — Other Ambulatory Visit: Payer: Self-pay | Admitting: Internal Medicine

## 2013-08-09 ENCOUNTER — Other Ambulatory Visit: Payer: Self-pay | Admitting: Internal Medicine

## 2013-09-05 ENCOUNTER — Other Ambulatory Visit: Payer: Self-pay | Admitting: Internal Medicine

## 2013-09-17 ENCOUNTER — Emergency Department (HOSPITAL_COMMUNITY)
Admission: EM | Admit: 2013-09-17 | Discharge: 2013-09-17 | Disposition: A | Discharge status: Home / Self Care | Payer: BC Managed Care – PPO | Attending: Emergency Medicine | Admitting: Emergency Medicine

## 2013-09-17 ENCOUNTER — Emergency Department (HOSPITAL_COMMUNITY): Payer: BC Managed Care – PPO

## 2013-09-17 ENCOUNTER — Encounter (HOSPITAL_COMMUNITY): Payer: Self-pay | Admitting: Emergency Medicine

## 2013-09-17 DIAGNOSIS — Z7982 Long term (current) use of aspirin: Secondary | ICD-10-CM | POA: Insufficient documentation

## 2013-09-17 DIAGNOSIS — K5900 Constipation, unspecified: Secondary | ICD-10-CM | POA: Insufficient documentation

## 2013-09-17 DIAGNOSIS — R109 Unspecified abdominal pain: Secondary | ICD-10-CM | POA: Insufficient documentation

## 2013-09-17 DIAGNOSIS — E119 Type 2 diabetes mellitus without complications: Secondary | ICD-10-CM | POA: Insufficient documentation

## 2013-09-17 DIAGNOSIS — E785 Hyperlipidemia, unspecified: Secondary | ICD-10-CM | POA: Insufficient documentation

## 2013-09-17 DIAGNOSIS — M1710 Unilateral primary osteoarthritis, unspecified knee: Secondary | ICD-10-CM | POA: Insufficient documentation

## 2013-09-17 DIAGNOSIS — Z79899 Other long term (current) drug therapy: Secondary | ICD-10-CM | POA: Insufficient documentation

## 2013-09-17 DIAGNOSIS — N281 Cyst of kidney, acquired: Secondary | ICD-10-CM | POA: Insufficient documentation

## 2013-09-17 DIAGNOSIS — Z87891 Personal history of nicotine dependence: Secondary | ICD-10-CM | POA: Insufficient documentation

## 2013-09-17 LAB — COMPREHENSIVE METABOLIC PANEL
ALT: 19 U/L (ref 0–53)
AST: 20 U/L (ref 0–37)
CO2: 26 mEq/L (ref 19–32)
Calcium: 9.7 mg/dL (ref 8.4–10.5)
Chloride: 97 mEq/L (ref 96–112)
GFR calc non Af Amer: 79 mL/min — ABNORMAL LOW (ref >90)
Sodium: 136 mEq/L (ref 135–145)
Total Bilirubin: 0.9 mg/dL (ref 0.3–1.2)

## 2013-09-17 LAB — CBC WITH DIFFERENTIAL/PLATELET
Basophils Absolute: 0 10*3/uL (ref 0.0–0.1)
Lymphocytes Relative: 19 % (ref 12–46)
Neutro Abs: 8.8 10*3/uL — ABNORMAL HIGH (ref 1.7–7.7)
Neutrophils Relative %: 71 % (ref 43–77)
Platelets: 248 10*3/uL (ref 150–400)
RDW: 15 % (ref 11.5–15.5)
WBC: 12.3 10*3/uL — ABNORMAL HIGH (ref 4.0–10.5)

## 2013-09-17 LAB — URINALYSIS, ROUTINE W REFLEX MICROSCOPIC
Glucose, UA: NEGATIVE mg/dL
Hgb urine dipstick: NEGATIVE
Protein, ur: NEGATIVE mg/dL

## 2013-09-17 MED ORDER — SODIUM CHLORIDE 0.9 % IV BOLUS (SEPSIS)
1000.0000 mL | Freq: Once | INTRAVENOUS | Status: AC
  Administered 2013-09-17: 1000 mL via INTRAVENOUS

## 2013-09-17 MED ORDER — KETOROLAC TROMETHAMINE 30 MG/ML IJ SOLN
30.0000 mg | Freq: Once | INTRAMUSCULAR | Status: AC
  Administered 2013-09-17: 30 mg via INTRAVENOUS
  Filled 2013-09-17: qty 1

## 2013-09-17 MED ORDER — POLYETHYLENE GLYCOL 3350 17 GM/SCOOP PO POWD: 17.0000 g | Freq: Every day | ORAL | Status: DC

## 2013-09-17 MED ORDER — MORPHINE SULFATE 4 MG/ML IJ SOLN
4.0000 mg | Freq: Once | INTRAMUSCULAR | Status: AC
  Administered 2013-09-17: 4 mg via INTRAVENOUS
  Filled 2013-09-17: qty 1

## 2013-09-17 MED ORDER — IOHEXOL 300 MG/ML  SOLN
50.0000 mL | Freq: Once | INTRAMUSCULAR | Status: AC | PRN
  Administered 2013-09-17: 50 mL via ORAL

## 2013-09-17 MED ORDER — IOHEXOL 300 MG/ML  SOLN
100.0000 mL | Freq: Once | INTRAMUSCULAR | Status: AC | PRN
  Administered 2013-09-17: 100 mL via INTRAVENOUS

## 2013-09-17 MED ORDER — ONDANSETRON HCL 4 MG/2ML IJ SOLN
4.0000 mg | Freq: Once | INTRAMUSCULAR | Status: AC
  Administered 2013-09-17: 4 mg via INTRAVENOUS
  Filled 2013-09-17: qty 2

## 2013-09-17 NOTE — ED Provider Notes (Signed)
CSN: [id number]     Arrival date & time 09/17/13  0119 History   First MD Initiated Contact with Patient 09/17/13 0130     Chief Complaint  Patient presents with  . Abdominal Pain   HPI  History provided by the patient. Patient is a 57 year old male with history of diabetes, hyperlipidemia who presents with complaints of left abdominal pain. Patient states he has had worsening sharp severe left-sided abdominal pain that has been constant since early yesterday morning. Pain has gradually worsened throughout the day and night. Patient does mention having some brief episodes of similar sharp left-sided pains last Friday 2 days ago. He initially thought this may be loading and constipation pains however he reports "healthy" normal bowel movements Saturday and Sunday. He also had a bowel movement yesterday. He denies any straining or hard stool. Denies any blood in the stool. His symptoms have been slightly worse when he stands and walks. He denies any pleuritic pains. Denies any chest pain or shortness of breath. There is no associated fever, chills or sweats. No associated nausea or vomiting. Patient did take 2 values this evening hoping it would improve his symptoms and help him sleep however this did not work. Patient does not use alcohol regularly. He takes Valium occasionally for a central tremor. He has not had any other changes in medicines. No other aggravating or alleviating factors. No other associated symptoms.    Past Medical History  Diagnosis Date  . DIABETES MELLITUS, TYPE II 05/11/2007  . DIASTOLIC DYSFUNCTION 07/06/2010  . DYSPNEA 07/06/2010  . HERPES ZOSTER 07/17/2009  . HYPERLIPIDEMIA 05/11/2007  . OSTEOARTHRITIS 04/16/2008  . OSTEOARTHROSIS, LOCAL NOS, LOWER LEG 09/06/2007  . TREMOR, ESSENTIAL 09/06/2007   Past Surgical History  Procedure Laterality Date  . Total knee arthroplasty    . Rotator cuff repair    . Cervical fusion     Family History  Problem Relation Age of Onset  .  Arthritis Mother   . Hypertension Mother   . Hyperlipidemia Mother   . Hypertension Father   . Hyperlipidemia Father    History  Substance Use Topics  . Smoking status: Former Smoker    Quit date: 12/27/1999  . Smokeless tobacco: Never Used  . Alcohol Use: Yes     Comment: occasional    Review of Systems  Constitutional: Negative for fever, chills and diaphoresis.  Respiratory: Negative for cough and shortness of breath.   Cardiovascular: Negative for chest pain.  Gastrointestinal: Positive for abdominal pain. Negative for nausea, vomiting, diarrhea, constipation, blood in stool and rectal pain.  Genitourinary: Negative for dysuria, frequency, hematuria and flank pain.  All other systems reviewed and are negative.    Allergies  Celecoxib; Clindamycin hcl; Codeine sulfate; and Erythromycin  Home Medications   Current Outpatient Rx  Name  Route  Sig  Dispense  Refill  . aspirin 81 MG tablet   Oral   Take 81 mg by mouth daily.           Marland Kitchen atorvastatin (LIPITOR) 10 MG tablet      TAKE 1 TABLET EVERY DAY   30 tablet   9   . benazepril (LOTENSIN) 5 MG tablet      TAKE 1/2 TABLET EVERY DAY WITH GOAL TO INCREASE TO 1 TABLET EVERY DAY   30 tablet   4   . diazepam (VALIUM) 5 MG tablet      TAKE 1/2 TABLET EVERY 8 HOURS AS NEEDED   45 tablet  3   . DULoxetine (CYMBALTA) 60 MG capsule      TAKE ONE CAPSULE EVERY DAY   30 capsule   6   . metFORMIN (GLUCOPHAGE-XR) 500 MG 24 hr tablet      TAKE 2 TABLETS WITH BREAKFAST   60 tablet   10    BP 162/96  Pulse 84  Temp(Src) 97.4 F (36.3 C) (Oral)  Resp 22  SpO2 100% Physical Exam  Nursing note and vitals reviewed. Constitutional: He is oriented to person, place, and time. He appears well-developed and well-nourished. No distress.  HENT:  Head: Normocephalic and atraumatic.  Eyes: Conjunctivae are normal.  Cardiovascular: Normal rate and regular rhythm.   Pulmonary/Chest: Effort normal and breath sounds  normal. No respiratory distress. He has no wheezes. He has no rales.  Abdominal: Soft. He exhibits no distension and no mass. There is no hepatosplenomegaly. There is tenderness in the left upper quadrant and left lower quadrant. There is no rigidity, no rebound, no guarding, no CVA tenderness, no tenderness at McBurney's point and negative Murphy's sign.  Musculoskeletal: Normal range of motion.  Neurological: He is alert and oriented to person, place, and time.  Skin: Skin is warm.  Psychiatric: He has a normal mood and affect. His behavior is normal.    ED Course  Procedures   Results for orders placed during the hospital encounter of 09/17/13  CBC WITH DIFFERENTIAL      Result Value Range   WBC 12.3 (*) 4.0 - 10.5 K/uL   RBC 4.93  4.22 - 5.81 MIL/uL   Hemoglobin 13.3  13.0 - 17.0 g/dL   HCT 40.9  81.1 - 91.4 %   MCV 81.7  78.0 - 100.0 fL   MCH 27.0  26.0 - 34.0 pg   MCHC 33.0  30.0 - 36.0 g/dL   RDW 78.2  95.6 - 21.3 %   Platelets 248  150 - 400 K/uL   Neutrophils Relative % 71  43 - 77 %   Neutro Abs 8.8 (*) 1.7 - 7.7 K/uL   Lymphocytes Relative 19  12 - 46 %   Lymphs Abs 2.3  0.7 - 4.0 K/uL   Monocytes Relative 10  3 - 12 %   Monocytes Absolute 1.2 (*) 0.1 - 1.0 K/uL   Eosinophils Relative 0  0 - 5 %   Eosinophils Absolute 0.1  0.0 - 0.7 K/uL   Basophils Relative 0  0 - 1 %   Basophils Absolute 0.0  0.0 - 0.1 K/uL  COMPREHENSIVE METABOLIC PANEL      Result Value Range   Sodium 136  135 - 145 mEq/L   Potassium 3.9  3.5 - 5.1 mEq/L   Chloride 97  96 - 112 mEq/L   CO2 26  19 - 32 mEq/L   Glucose, Bld 135 (*) 70 - 99 mg/dL   BUN 13  6 - 23 mg/dL   Creatinine, Ser 0.86  0.50 - 1.35 mg/dL   Calcium 9.7  8.4 - 57.8 mg/dL   Total Protein 7.3  6.0 - 8.3 g/dL   Albumin 3.9  3.5 - 5.2 g/dL   AST 20  0 - 37 U/L   ALT 19  0 - 53 U/L   Alkaline Phosphatase 106  39 - 117 U/L   Total Bilirubin 0.9  0.3 - 1.2 mg/dL   GFR calc non Af Amer 79 (*) >90 mL/min   GFR calc Af Amer >90   >90 mL/min  LIPASE,  BLOOD      Result Value Range   Lipase 36  11 - 59 U/L  URINALYSIS, ROUTINE W REFLEX MICROSCOPIC      Result Value Range   Color, Urine YELLOW  YELLOW   APPearance CLOUDY (*) CLEAR   Specific Gravity, Urine 1.020  1.005 - 1.030   pH 8.5 (*) 5.0 - 8.0   Glucose, UA NEGATIVE  NEGATIVE mg/dL   Hgb urine dipstick NEGATIVE  NEGATIVE   Bilirubin Urine NEGATIVE  NEGATIVE   Ketones, ur NEGATIVE  NEGATIVE mg/dL   Protein, ur NEGATIVE  NEGATIVE mg/dL   Urobilinogen, UA 0.2  0.0 - 1.0 mg/dL   Nitrite NEGATIVE  NEGATIVE   Leukocytes, UA NEGATIVE  NEGATIVE       Imaging Review Ct Abdomen Pelvis W Contrast  09/17/2013   CLINICAL DATA:  Left lower quadrant pain. Elevated white blood cell count.  EXAM: CT ABDOMEN AND PELVIS WITH CONTRAST  TECHNIQUE: Multidetector CT imaging of the abdomen and pelvis was performed using the standard protocol following bolus administration of intravenous contrast.  CONTRAST:  OMNIPAQUE IOHEXOL 300 MG/ML  SOLN  COMPARISON:  None.  FINDINGS: Mild dependent atelectasis is seen in the lung bases. No pleural or pericardial effusion.  The liver, gallbladder, spleen and adrenal glands appear normal. A 0.5 cm nonobstructing stone at the is identified in the upper pole of the left kidney. A tiny low attenuating lesion in the upper pole of the right kidney is likely a cyst. The kidneys are otherwise unremarkable. The patient has a fairly large volume of stool in the ascending colon through the transverse. The colon is otherwise unremarkable. The appendix is well visualized and appears normal. Urinary bladder, prostate gland seminal vesicles are unremarkable. No lymphadenopathy or fluid is identified. There is no focal bony abnormality.  IMPRESSION: No acute finding or finding to explain the patient's symptoms.  0.5 cm nonobstructing stone upper pole left kidney.  Large stool burden ascending and transverse colon.   Electronically Signed   By: Drusilla Kanner  M.D.   On: 09/17/2013 04:51    MDM   1. Abdominal pain   2. Constipation   3. Renal cyst      1:30AM patient seen and evaluated. Patient appears uncomfortable. Does not appear in any acute distress.  3:00 AM patient continues to have pain after morphine. Currently he does not request any additional medicines. There was slight improvements. Pain continues to be focused on the left abdomen. Labs show slightly elevated WBC otherwise unremarkable. Normal lipase. He is tender to touch without peritoneal signs. Continues to be concerned about his symptoms. We'll proceed with CT scan for further evaluation. Patient does have prior history of colonoscopy with reports of diverticula. Will evaluate for possible diverticulitis.  CT scan does not show any signs of an acute emergent condition explaining patient's left-sided abdominal pains. There does appear to be a large stool burden through the ascending and transverse colons with some surrounding air. This especially true at the splenic flexure area. This may be the source of his pains. I did discuss the findings of a left intrarenal kidney stone and right kidney cyst.  Patient advised to followup with PCP for this. Patient agrees with this plan.  Angus Seller, PA-C 09/17/13 0522

## 2013-09-17 NOTE — ED Notes (Signed)
Pt states that he has had L sided abdominal pain x 1 week that is worse today. States he suffers from constipation but had a BM yesterday. Denies urinary sx.

## 2013-09-17 NOTE — ED Provider Notes (Signed)
Medical screening examination/treatment/procedure(s) were performed by non-physician practitioner and as supervising physician I was immediately available for consultation/collaboration.  Lyanne Co, MD 09/17/13 [phone]

## 2013-09-18 ENCOUNTER — Ambulatory Visit: Payer: BC Managed Care – PPO | Admitting: Internal Medicine

## 2013-09-18 ENCOUNTER — Telehealth: Payer: Self-pay | Admitting: Internal Medicine

## 2013-09-18 NOTE — Telephone Encounter (Signed)
Per dr Yvonne Kendall use glycerin supp if that doesn't work try fleets enema

## 2013-09-18 NOTE — Telephone Encounter (Signed)
Patient Information:  Caller Name: Row[place]  Phone: [phone]  Patient: Isaiah Dunn, Isaiah Dunn  Gender: Male  DOB: 06/14/1956  Age: 57 Years  PCP: Darryll Capers (Adults only)  Office Follow Up:  Does the office need to follow up with this patient?: Yes  Instructions For The Office: was in the ED 09/17/13 for inflammed colon and severe constipation; using hte Miralax as directed, with no results; would like recommendations on what to do to pass the stool and to help with the pain  RN Note:  says driving is uncomfortable; would like to know what else he can do to release the stool and help with the pain  Symptoms  Reason For Call & Symptoms: seen in the ED 09/17/13 am for left abdominal pain; CT done; diagnosed with inflammed colon and severe constipation; instructed to use Miralax; last BM 9/21 and normal; pain is intermittent  Reviewed Health History In EMR: Yes  Reviewed Medications In EMR: Yes  Reviewed Allergies In EMR: Yes  Reviewed Surgeries / Procedures: Yes  Date of Onset of Symptoms: 09/16/2013  Treatments Tried: Miralax, high fiber diet, increased water  Treatments Tried Worked: No  Guideline(s) Used:  Constipation  Disposition Per Guideline:   See Today in Office  Reason For Disposition Reached:   Abdomen is more swollen than usual  Advice Given:  N/A  RN Overrode Recommendation:  Follow Up With Office Later  would like to hear recommendations from MD

## 2013-09-19 ENCOUNTER — Telehealth: Payer: Self-pay | Admitting: Internal Medicine

## 2013-09-19 MED ORDER — METRONIDAZOLE 500 MG PO TABS: 500.0000 mg | ORAL_TABLET | Freq: Two times a day (BID) | ORAL | Status: DC

## 2013-09-19 MED ORDER — CIPROFLOXACIN HCL 500 MG PO TABS: 500.0000 mg | ORAL_TABLET | Freq: Two times a day (BID) | ORAL | Status: DC

## 2013-09-19 NOTE — Telephone Encounter (Signed)
Patient Information:  Caller Name: [place]  Phone: [phone]  Patient: Isaiah Dunn, Isaiah Dunn  Gender: Male  DOB: 09-13-1956  Age: 57 Years  PCP: Darryll Capers (Adults only)  Office Follow Up:  Does the office need to follow up with this patient?: Yes  Instructions For The Office: Pls see RN note  RN Note:  Pt was diagnosed w/ Inflammed Colon at [facility] on 9-23.  Pt had BM after Fleet Enema advice from office on 9-24, Pt complains of severe LLQ pain after food is digested, states once food hits left lower colon, pain is excruciating. Pt denies blood in stool, states he is having normal BM. Pt would like to know if antibiotics will help, had elevated WBC at ED visit, feels warm to touch at triage, no themometer to take temp.  Please discuss w/ MD and f/u w/ Pt regarding antibiotic question.  Symptoms  Reason For Call & Symptoms: Inflammed Colon F/U at Lett side  Reviewed Health History In EMR: Yes  Reviewed Medications In EMR: Yes  Reviewed Allergies In EMR: Yes  Reviewed Surgeries / Procedures: Yes  Date of Onset of Symptoms: 09/16/2013  Treatments Tried: Fleet Enema  Treatments Tried Worked: Yes  Any Fever: Yes  Fever Taken: Tactile  Fever Time Of Reading: 13:19:27  Fever Last Reading: N/A  Guideline(s) Used:  Abdominal Pain - Male  Disposition Per Guideline:   See Today in Office  Reason For Disposition Reached:   Mild pain that comes and goes (cramps) lasts > 24 hours  Advice Given:  N/A  RN Overrode Recommendation:  Patient Requests Prescription  See RN note

## 2013-09-19 NOTE — Telephone Encounter (Signed)
Per dr Lovell Sheehan- send in cipro 500 bid and flagyl 500 bid for 7 days

## 2013-09-20 ENCOUNTER — Ambulatory Visit: Payer: BC Managed Care – PPO | Admitting: Internal Medicine

## 2013-09-27 ENCOUNTER — Emergency Department (HOSPITAL_COMMUNITY)
Admission: EM | Admit: 2013-09-27 | Discharge: 2013-09-28 | Disposition: A | Discharge status: Home / Self Care | Payer: BC Managed Care – PPO | Attending: Emergency Medicine | Admitting: Emergency Medicine

## 2013-09-27 ENCOUNTER — Encounter (HOSPITAL_COMMUNITY): Payer: Self-pay | Admitting: *Deleted

## 2013-09-27 DIAGNOSIS — Z8619 Personal history of other infectious and parasitic diseases: Secondary | ICD-10-CM | POA: Insufficient documentation

## 2013-09-27 DIAGNOSIS — M1710 Unilateral primary osteoarthritis, unspecified knee: Secondary | ICD-10-CM | POA: Insufficient documentation

## 2013-09-27 DIAGNOSIS — N201 Calculus of ureter: Secondary | ICD-10-CM | POA: Insufficient documentation

## 2013-09-27 DIAGNOSIS — Z87891 Personal history of nicotine dependence: Secondary | ICD-10-CM | POA: Insufficient documentation

## 2013-09-27 DIAGNOSIS — E119 Type 2 diabetes mellitus without complications: Secondary | ICD-10-CM | POA: Insufficient documentation

## 2013-09-27 DIAGNOSIS — I5030 Unspecified diastolic (congestive) heart failure: Secondary | ICD-10-CM | POA: Insufficient documentation

## 2013-09-27 DIAGNOSIS — Z792 Long term (current) use of antibiotics: Secondary | ICD-10-CM | POA: Insufficient documentation

## 2013-09-27 DIAGNOSIS — Z79899 Other long term (current) drug therapy: Secondary | ICD-10-CM | POA: Insufficient documentation

## 2013-09-27 DIAGNOSIS — IMO0002 Reserved for concepts with insufficient information to code with codable children: Secondary | ICD-10-CM | POA: Insufficient documentation

## 2013-09-27 DIAGNOSIS — E785 Hyperlipidemia, unspecified: Secondary | ICD-10-CM | POA: Insufficient documentation

## 2013-09-27 DIAGNOSIS — Z7982 Long term (current) use of aspirin: Secondary | ICD-10-CM | POA: Insufficient documentation

## 2013-09-27 NOTE — ED Notes (Signed)
Patient states that he began to gradually have left flank pain. Patient describes the pain as a "tight throbbing feeling". He took valium thinking the pain was musculoskeletal and he was not relieved at all. Patient has had 2 bowel movements but no flatus.

## 2013-09-28 ENCOUNTER — Emergency Department (HOSPITAL_COMMUNITY): Payer: BC Managed Care – PPO

## 2013-09-28 LAB — URINALYSIS, ROUTINE W REFLEX MICROSCOPIC
Bilirubin Urine: NEGATIVE
Glucose, UA: NEGATIVE mg/dL
Ketones, ur: NEGATIVE mg/dL
Leukocytes, UA: NEGATIVE
Nitrite: NEGATIVE
Protein, ur: NEGATIVE mg/dL
Specific Gravity, Urine: 1.027 (ref 1.005–1.030)
Urobilinogen, UA: 0.2 mg/dL (ref 0.0–1.0)
pH: 5 (ref 5.0–8.0)

## 2013-09-28 LAB — CBC WITH DIFFERENTIAL/PLATELET
Basophils Absolute: 0 10*3/uL (ref 0.0–0.1)
Basophils Relative: 0 % (ref 0–1)
Eosinophils Absolute: 0.2 10*3/uL (ref 0.0–0.7)
Eosinophils Relative: 1 % (ref 0–5)
HCT: 38.3 % — ABNORMAL LOW (ref 39.0–52.0)
Hemoglobin: 12.2 g/dL — ABNORMAL LOW (ref 13.0–17.0)
Lymphocytes Relative: 22 % (ref 12–46)
Lymphs Abs: 2.6 10*3/uL (ref 0.7–4.0)
MCH: 26.5 pg (ref 26.0–34.0)
MCHC: 31.9 g/dL (ref 30.0–36.0)
MCV: 83.1 fL (ref 78.0–100.0)
Monocytes Absolute: 1.1 10*3/uL — ABNORMAL HIGH (ref 0.1–1.0)
Monocytes Relative: 9 % (ref 3–12)
Neutro Abs: 7.9 10*3/uL — ABNORMAL HIGH (ref 1.7–7.7)
Neutrophils Relative %: 67 % (ref 43–77)
Platelets: 302 10*3/uL (ref 150–400)
RBC: 4.61 MIL/uL (ref 4.22–5.81)
RDW: 14.6 % (ref 11.5–15.5)
WBC: 11.8 10*3/uL — ABNORMAL HIGH (ref 4.0–10.5)

## 2013-09-28 LAB — URINE MICROSCOPIC-ADD ON

## 2013-09-28 LAB — COMPREHENSIVE METABOLIC PANEL
ALT: 32 U/L (ref 0–53)
AST: 35 U/L (ref 0–37)
Albumin: 3.5 g/dL (ref 3.5–5.2)
Alkaline Phosphatase: 103 U/L (ref 39–117)
BUN: 27 mg/dL — ABNORMAL HIGH (ref 6–23)
CO2: 26 mEq/L (ref 19–32)
Calcium: 9.2 mg/dL (ref 8.4–10.5)
Chloride: 101 mEq/L (ref 96–112)
Creatinine, Ser: 1.41 mg/dL — ABNORMAL HIGH (ref 0.50–1.35)
GFR calc Af Amer: 62 mL/min — ABNORMAL LOW (ref >90)
GFR calc non Af Amer: 54 mL/min — ABNORMAL LOW (ref >90)
Glucose, Bld: 122 mg/dL — ABNORMAL HIGH (ref 70–99)
Potassium: 4.3 mEq/L (ref 3.5–5.1)
Sodium: 136 mEq/L (ref 135–145)
Total Bilirubin: 0.3 mg/dL (ref 0.3–1.2)
Total Protein: 6.7 g/dL (ref 6.0–8.3)

## 2013-09-28 MED ORDER — SODIUM CHLORIDE 0.9 % IV BOLUS (SEPSIS)
1000.0000 mL | Freq: Once | INTRAVENOUS | Status: AC
  Administered 2013-09-28: 1000 mL via INTRAVENOUS

## 2013-09-28 MED ORDER — IOHEXOL 300 MG/ML  SOLN
100.0000 mL | Freq: Once | INTRAMUSCULAR | Status: AC | PRN
  Administered 2013-09-28: 100 mL via INTRAVENOUS

## 2013-09-28 MED ORDER — KETOROLAC TROMETHAMINE 30 MG/ML IJ SOLN
30.0000 mg | Freq: Once | INTRAMUSCULAR | Status: AC
  Administered 2013-09-28: 30 mg via INTRAVENOUS
  Filled 2013-09-28: qty 1

## 2013-09-28 MED ORDER — OXYCODONE-ACETAMINOPHEN 5-325 MG PO TABS: 1.0000 | ORAL_TABLET | Freq: Four times a day (QID) | ORAL | Status: DC | PRN

## 2013-09-28 MED ORDER — TAMSULOSIN HCL 0.4 MG PO CAPS: 0.4000 mg | ORAL_CAPSULE | Freq: Every day | ORAL | Status: DC

## 2013-09-28 MED ORDER — IOHEXOL 300 MG/ML  SOLN
50.0000 mL | Freq: Once | INTRAMUSCULAR | Status: AC | PRN
  Administered 2013-09-28: 50 mL via ORAL

## 2013-09-28 MED ORDER — HYDROMORPHONE HCL PF 1 MG/ML IJ SOLN
1.0000 mg | Freq: Once | INTRAMUSCULAR | Status: AC
  Administered 2013-09-28: 1 mg via INTRAVENOUS
  Filled 2013-09-28: qty 1

## 2013-09-28 MED ORDER — MORPHINE SULFATE 4 MG/ML IJ SOLN
6.0000 mg | Freq: Once | INTRAMUSCULAR | Status: AC
  Administered 2013-09-28: 6 mg via INTRAVENOUS
  Filled 2013-09-28: qty 2

## 2013-09-28 MED ORDER — OXYCODONE-ACETAMINOPHEN 5-325 MG PO TABS: 1.0000 | ORAL_TABLET | ORAL | Status: DC | PRN

## 2013-09-28 NOTE — ED Notes (Signed)
CT tech at bedside to deliver oral contrast for CT scan.

## 2013-09-28 NOTE — ED Provider Notes (Signed)
Medical screening examination/treatment/procedure(s) were conducted as a shared visit with non-physician practitioner(s) and myself.  I personally evaluated the patient during the encounter  Presenting with recurrent left flank pain. Exam is tender left flank and left lower quadrant abdomen. CT scan shows proximal 5 mm stone. Pain resolved with IV narcotics. Patient is appropriate for close outpatient followup with urology. Strict return precautions provided.  Sunnie Nielsen, MD 09/28/13 [phone]

## 2013-09-28 NOTE — ED Provider Notes (Signed)
CSN: [id number]     Arrival date & time 09/27/13  2327 History   First MD Initiated Contact with Patient 09/27/13 2359     Chief Complaint  Patient presents with  . Flank Pain   (Consider location/radiation/quality/duration/timing/severity/associated sxs/prior Treatment) HPI Patient presents emergency department with left-sided flank pain that started suddenly at 8 PM.  Patient, states he was seen here on September 23 for similar symptoms at that time was found to have what they thought maybe, constipation.  Patient denies nausea, vomiting, diarrhea, back pain, dysuria, fever, dizziness, weakness, chest pain, or shortness of breath.  Patient, states he did not take any medications prior to arrival.  He states he didn't receive a ten-day course of antibiotics from his primary care doctor in case there was an infection that caused this pain for the patient. Past Medical History  Diagnosis Date  . DIABETES MELLITUS, TYPE II 05/11/2007  . DIASTOLIC DYSFUNCTION 07/06/2010  . DYSPNEA 07/06/2010  . HERPES ZOSTER 07/17/2009  . HYPERLIPIDEMIA 05/11/2007  . OSTEOARTHRITIS 04/16/2008  . OSTEOARTHROSIS, LOCAL NOS, LOWER LEG 09/06/2007  . TREMOR, ESSENTIAL 09/06/2007   Past Surgical History  Procedure Laterality Date  . Total knee arthroplasty    . Rotator cuff repair    . Cervical fusion     Family History  Problem Relation Age of Onset  . Arthritis Mother   . Hypertension Mother   . Hyperlipidemia Mother   . Hypertension Father   . Hyperlipidemia Father    History  Substance Use Topics  . Smoking status: Former Smoker    Quit date: 12/27/1999  . Smokeless tobacco: Never Used  . Alcohol Use: Yes     Comment: occasional    Review of Systems All other systems negative except as documented in the HPI. All pertinent positives and negatives as reviewed in the HPI. Allergies  Celecoxib; Clindamycin hcl; Codeine sulfate; and Erythromycin  Home Medications   Current Outpatient Rx  Name  Route   Sig  Dispense  Refill  . aspirin 81 MG chewable tablet   Oral   Chew 81 mg by mouth daily.         Marland Kitchen atorvastatin (LIPITOR) 10 MG tablet      TAKE 1 TABLET EVERY DAY   30 tablet   9   . benazepril (LOTENSIN) 5 MG tablet   Oral   Take 2.5 mg by mouth every evening.          . diazepam (VALIUM) 5 MG tablet   Oral   Take 2.5 mg by mouth every 8 (eight) hours as needed (tremors). For tremors         . DULoxetine (CYMBALTA) 60 MG capsule      TAKE ONE CAPSULE EVERY DAY   30 capsule   6   . metFORMIN (GLUCOPHAGE-XR) 500 MG 24 hr tablet   Oral   Take 1,000 mg by mouth daily with breakfast.         . Multiple Vitamin (MULTIVITAMIN WITH MINERALS) TABS tablet   Oral   Take 1 tablet by mouth daily.         . polyethylene glycol powder (GLYCOLAX/MIRALAX) powder   Oral   Take 17 g by mouth daily.   255 g   0   . ciprofloxacin (CIPRO) 500 MG tablet   Oral   Take 1 tablet (500 mg total) by mouth 2 (two) times daily.   14 tablet   0   . metroNIDAZOLE (FLAGYL) 500 MG  tablet   Oral   Take 1 tablet (500 mg total) by mouth 2 (two) times daily.   14 tablet   0    BP 146/93  Pulse 74  Temp(Src) 98.1 F (36.7 C) (Oral)  Resp 18  SpO2 100% Physical Exam  Nursing note and vitals reviewed. Constitutional: He is oriented to person, place, and time. He appears well-developed and well-nourished. No distress.  HENT:  Head: Normocephalic and atraumatic.  Eyes: Pupils are equal, round, and reactive to light.  Neck: Normal range of motion. Neck supple.  Cardiovascular: Normal rate, regular rhythm and normal heart sounds.  Exam reveals no gallop.   No murmur heard. Pulmonary/Chest: Effort normal and breath sounds normal. No respiratory distress.  Abdominal: Soft. Bowel sounds are normal. There is no rigidity and no guarding.    Neurological: He is alert and oriented to person, place, and time. He exhibits normal muscle tone. Coordination normal.  Skin: Skin is warm  and dry.    ED Course  Procedures (including critical care time) Labs Review Labs Reviewed  CBC WITH DIFFERENTIAL - Abnormal; Notable for the following:    WBC 11.8 (*)    Hemoglobin 12.2 (*)    HCT 38.3 (*)    Neutro Abs 7.9 (*)    Monocytes Absolute 1.1 (*)    All other components within normal limits  COMPREHENSIVE METABOLIC PANEL - Abnormal; Notable for the following:    Glucose, Bld 122 (*)    BUN 27 (*)    Creatinine, Ser 1.41 (*)    GFR calc non Af Amer 54 (*)    GFR calc Af Amer 62 (*)    All other components within normal limits  URINALYSIS, ROUTINE W REFLEX MICROSCOPIC - Abnormal; Notable for the following:    Hgb urine dipstick SMALL (*)    All other components within normal limits  URINE MICROSCOPIC-ADD ON   Imaging Review Ct Abdomen Pelvis W Contrast  09/28/2013   CLINICAL DATA:  Throbbing flank pain  EXAM: CT ABDOMEN AND PELVIS WITH CONTRAST  TECHNIQUE: Multidetector CT imaging of the abdomen and pelvis was performed using the standard protocol following bolus administration of intravenous contrast.  CONTRAST:  50mL OMNIPAQUE IOHEXOL 300 MG/ML SOLN, OMNIPAQUE IOHEXOL 300 MG/ML SOLN  COMPARISON:  09/17/2013  FINDINGS: BODY WALL: Developing bilateral inguinal hernias, containing fat.  LOWER CHEST:  Mediastinum: Tiny hiatal hernia.  Lungs/pleura: No consolidation.  ABDOMEN/PELVIS:  Liver: No focal abnormality.  Biliary: No evidence of biliary obstruction or stone.  Pancreas: Unremarkable.  Spleen: Unremarkable.  Adrenals: Unremarkable.  Kidneys and ureters: Left-sided hydronephrosis with extensive left perinephric edema and delayed renal enhancement. 5 mm stone present in the proximal to mid left ureter.  Bladder: Unremarkable.  Bowel: Large volume of stool proximally, although the distal colon is decompressed. Few colonic diverticula. Normal appendix.  Retroperitoneum: No mass or adenopathy.  Peritoneum: No free fluid or gas.  Reproductive: Unremarkable.  Vascular: No  acute abnormality.  Aortoiliac atherosclerosis.  OSSEOUS: No acute abnormalities. No suspicious lytic or blastic lesions.  IMPRESSION: 5 mm proximal to mid left ureteric stone with obstructive uropathy.   Electronically Signed   By: Tiburcio Pea M.D.   On: 09/28/2013 03:46   Dg Abd Acute W/chest  09/28/2013   CLINICAL DATA:  Left-sided abdominal pain.  EXAM: ACUTE ABDOMEN SERIES (ABDOMEN 2 VIEW & CHEST 1 VIEW)  COMPARISON:  09/17/2013  FINDINGS: There is no evidence of dilated bowel loops or free intraperitoneal air. Large volume  formed stool throughout the colon. No radiopaque calculi (known left nephrolithiasis not evident) or other significant radiographic abnormality is seen. Heart size and mediastinal contours are within normal limits. Both lungs are clear.  IMPRESSION: Large volume stool, without obstruction.   Electronically Signed   By: Tiburcio Pea M.D.   On: 09/28/2013 01:20   patient is feeling completely pain free at this time.  The patient be referred to urology for further evaluation and care.  He is told to return here for any worsening in his condition.  All questions were answered and the patient voices an understanding of the plan  MDM      Carlyle Dolly, PA-C 09/28/13 [phone]

## 2013-09-28 NOTE — ED Notes (Signed)
Patient transported to CT 

## 2013-10-25 ENCOUNTER — Encounter (HOSPITAL_BASED_OUTPATIENT_CLINIC_OR_DEPARTMENT_OTHER): Payer: Self-pay | Admitting: *Deleted

## 2013-10-25 ENCOUNTER — Other Ambulatory Visit: Payer: Self-pay | Admitting: Urology

## 2013-10-25 NOTE — Progress Notes (Signed)
NPO AFTER MN WITH EXCEPTION WATER/ GATORADE UNTIL 0700. ARRIVE AT 1130. NEEDS ISTAT .  CURRENT EKG IN EPIC AND CHART. MAY TAKE OXYCODONE OR DILAUDID IF NEEDED W/ SIPS OF WATER AM DOS.

## 2013-10-30 ENCOUNTER — Encounter (HOSPITAL_BASED_OUTPATIENT_CLINIC_OR_DEPARTMENT_OTHER): Payer: Self-pay | Admitting: *Deleted

## 2013-10-30 ENCOUNTER — Encounter (HOSPITAL_BASED_OUTPATIENT_CLINIC_OR_DEPARTMENT_OTHER)
Admission: RE | Disposition: A | Discharge status: Home / Self Care | Payer: Self-pay | Source: Ambulatory Visit | Attending: Urology

## 2013-10-30 ENCOUNTER — Ambulatory Visit (HOSPITAL_COMMUNITY): Payer: BC Managed Care – PPO

## 2013-10-30 ENCOUNTER — Encounter (HOSPITAL_BASED_OUTPATIENT_CLINIC_OR_DEPARTMENT_OTHER): Payer: BC Managed Care – PPO | Admitting: Anesthesiology

## 2013-10-30 ENCOUNTER — Ambulatory Visit (HOSPITAL_BASED_OUTPATIENT_CLINIC_OR_DEPARTMENT_OTHER): Payer: BC Managed Care – PPO | Admitting: Anesthesiology

## 2013-10-30 ENCOUNTER — Ambulatory Visit (HOSPITAL_BASED_OUTPATIENT_CLINIC_OR_DEPARTMENT_OTHER)
Admission: RE | Admit: 2013-10-30 | Discharge: 2013-10-30 | Disposition: A | Discharge status: Home / Self Care | Payer: BC Managed Care – PPO | Source: Ambulatory Visit | Attending: Urology | Admitting: Urology

## 2013-10-30 DIAGNOSIS — E119 Type 2 diabetes mellitus without complications: Secondary | ICD-10-CM | POA: Insufficient documentation

## 2013-10-30 DIAGNOSIS — I10 Essential (primary) hypertension: Secondary | ICD-10-CM | POA: Insufficient documentation

## 2013-10-30 DIAGNOSIS — E785 Hyperlipidemia, unspecified: Secondary | ICD-10-CM | POA: Insufficient documentation

## 2013-10-30 DIAGNOSIS — N135 Crossing vessel and stricture of ureter without hydronephrosis: Secondary | ICD-10-CM | POA: Insufficient documentation

## 2013-10-30 DIAGNOSIS — G250 Essential tremor: Secondary | ICD-10-CM | POA: Insufficient documentation

## 2013-10-30 DIAGNOSIS — N201 Calculus of ureter: Secondary | ICD-10-CM

## 2013-10-30 HISTORY — PX: CYSTOSCOPY WITH RETROGRADE PYELOGRAM, URETEROSCOPY AND STENT PLACEMENT: SHX5789

## 2013-10-30 HISTORY — DX: Heart disease, unspecified: I51.9

## 2013-10-30 HISTORY — DX: Essential (primary) hypertension: I10

## 2013-10-30 HISTORY — DX: Hyperlipidemia, unspecified: E78.5

## 2013-10-30 HISTORY — DX: Unspecified osteoarthritis, unspecified site: M19.90

## 2013-10-30 HISTORY — PX: HOLMIUM LASER APPLICATION: SHX5852

## 2013-10-30 HISTORY — DX: Diverticulosis of intestine, part unspecified, without perforation or abscess without bleeding: K57.90

## 2013-10-30 HISTORY — DX: Essential tremor: G25.0

## 2013-10-30 LAB — POCT I-STAT 4, (NA,K, GLUC, HGB,HCT)
HCT: 42 % (ref 39.0–52.0)
Hemoglobin: 14.3 g/dL (ref 13.0–17.0)
Sodium: 141 mEq/L (ref 135–145)

## 2013-10-30 SURGERY — CYSTOURETEROSCOPY, WITH RETROGRADE PYELOGRAM AND STENT INSERTION
Anesthesia: General | Site: Ureter | Laterality: Left | Wound class: Clean Contaminated

## 2013-10-30 MED ORDER — PHENAZOPYRIDINE HCL 100 MG PO TABS
100.0000 mg | ORAL_TABLET | Freq: Three times a day (TID) | ORAL | Status: DC
  Administered 2013-10-30: 100 mg via ORAL
  Filled 2013-10-30: qty 1

## 2013-10-30 MED ORDER — SODIUM CHLORIDE 0.9 % IR SOLN
Status: DC | PRN
  Administered 2013-10-30: 6000 mL

## 2013-10-30 MED ORDER — FENTANYL CITRATE 0.05 MG/ML IJ SOLN
INTRAMUSCULAR | Status: DC | PRN
  Administered 2013-10-30: 25 ug via INTRAVENOUS
  Administered 2013-10-30: 50 ug via INTRAVENOUS
  Administered 2013-10-30: 25 ug via INTRAVENOUS

## 2013-10-30 MED ORDER — LACTATED RINGERS IV SOLN
INTRAVENOUS | Status: DC
  Filled 2013-10-30: qty 1000

## 2013-10-30 MED ORDER — MIDAZOLAM HCL 5 MG/5ML IJ SOLN
INTRAMUSCULAR | Status: DC | PRN
  Administered 2013-10-30: 2 mg via INTRAVENOUS

## 2013-10-30 MED ORDER — HYDROMORPHONE HCL 4 MG PO TABS: 4.0000 mg | ORAL_TABLET | ORAL | Status: DC | PRN

## 2013-10-30 MED ORDER — ONDANSETRON HCL 4 MG/2ML IJ SOLN
INTRAMUSCULAR | Status: DC | PRN
  Administered 2013-10-30: 4 mg via INTRAVENOUS

## 2013-10-30 MED ORDER — DEXAMETHASONE SODIUM PHOSPHATE 4 MG/ML IJ SOLN
INTRAMUSCULAR | Status: DC | PRN
  Administered 2013-10-30: 10 mg via INTRAVENOUS

## 2013-10-30 MED ORDER — CEFAZOLIN SODIUM-DEXTROSE 2-3 GM-% IV SOLR
2.0000 g | INTRAVENOUS | Status: AC
  Administered 2013-10-30: 2 g via INTRAVENOUS
  Filled 2013-10-30: qty 50

## 2013-10-30 MED ORDER — BELLADONNA ALKALOIDS-OPIUM 16.2-60 MG RE SUPP
RECTAL | Status: DC | PRN
  Administered 2013-10-30: 1 via RECTAL

## 2013-10-30 MED ORDER — PHENAZOPYRIDINE HCL 100 MG PO TABS: 100.0000 mg | ORAL_TABLET | Freq: Three times a day (TID) | ORAL | Status: DC | PRN

## 2013-10-30 MED ORDER — LACTATED RINGERS IV SOLN
INTRAVENOUS | Status: DC
  Administered 2013-10-30 (×2): via INTRAVENOUS
  Filled 2013-10-30: qty 1000

## 2013-10-30 MED ORDER — HYDROMORPHONE HCL 4 MG PO TABS
4.0000 mg | ORAL_TABLET | ORAL | Status: DC | PRN
  Administered 2013-10-30: 2 mg via ORAL
  Filled 2013-10-30: qty 1

## 2013-10-30 MED ORDER — HYOSCYAMINE SULFATE 0.125 MG PO TABS: 0.1250 mg | ORAL_TABLET | ORAL | Status: DC | PRN

## 2013-10-30 MED ORDER — CEPHALEXIN 500 MG PO CAPS: 500.0000 mg | ORAL_CAPSULE | Freq: Three times a day (TID) | ORAL | Status: DC

## 2013-10-30 MED ORDER — IOHEXOL 350 MG/ML SOLN
INTRAVENOUS | Status: DC | PRN
  Administered 2013-10-30: 20 mL

## 2013-10-30 MED ORDER — KETOROLAC TROMETHAMINE 30 MG/ML IJ SOLN
INTRAMUSCULAR | Status: DC | PRN
  Administered 2013-10-30: 30 mg via INTRAVENOUS

## 2013-10-30 MED ORDER — ACETAMINOPHEN 10 MG/ML IV SOLN
INTRAVENOUS | Status: DC | PRN
  Administered 2013-10-30: 1000 mg via INTRAVENOUS

## 2013-10-30 MED ORDER — LIDOCAINE HCL (CARDIAC) 20 MG/ML IV SOLN
INTRAVENOUS | Status: DC | PRN
  Administered 2013-10-30: 100 mg via INTRAVENOUS

## 2013-10-30 MED ORDER — OXYBUTYNIN CHLORIDE 5 MG PO TABS: 5.0000 mg | ORAL_TABLET | Freq: Four times a day (QID) | ORAL | Status: DC | PRN

## 2013-10-30 MED ORDER — PROPOFOL 10 MG/ML IV BOLUS
INTRAVENOUS | Status: DC | PRN
  Administered 2013-10-30: 250 mg via INTRAVENOUS

## 2013-10-30 MED ORDER — OXYBUTYNIN CHLORIDE 5 MG PO TABS
5.0000 mg | ORAL_TABLET | Freq: Four times a day (QID) | ORAL | Status: DC | PRN
  Administered 2013-10-30: 5 mg via ORAL
  Filled 2013-10-30: qty 1

## 2013-10-30 MED ORDER — FENTANYL CITRATE 0.05 MG/ML IJ SOLN
25.0000 ug | INTRAMUSCULAR | Status: DC | PRN
  Filled 2013-10-30: qty 1

## 2013-10-30 MED ORDER — EPHEDRINE SULFATE 50 MG/ML IJ SOLN
INTRAMUSCULAR | Status: DC | PRN
  Administered 2013-10-30 (×2): 10 mg via INTRAVENOUS

## 2013-10-30 MED ORDER — LIDOCAINE HCL 2 % EX GEL
CUTANEOUS | Status: DC | PRN
  Administered 2013-10-30: 1 via URETHRAL

## 2013-10-30 SURGICAL SUPPLY — 40 items
BAG DRAIN URO-CYSTO SKYTR STRL (DRAIN) ×2 IMPLANT
BASKET LASER NITINOL 1.9FR (BASKET) IMPLANT
BASKET STNLS GEMINI 4WIRE 3FR (BASKET) IMPLANT
BASKET ZERO TIP NITINOL 2.4FR (BASKET) ×2 IMPLANT
BRUSH URET BIOPSY 3F (UROLOGICAL SUPPLIES) IMPLANT
CANISTER SUCT LVC 12 LTR MEDI- (MISCELLANEOUS) ×2 IMPLANT
CATH CLEAR GEL 3F BACKSTOP (CATHETERS) IMPLANT
CATH INTERMIT  6FR 70CM (CATHETERS) IMPLANT
CATH URET 5FR 28IN CONE TIP (BALLOONS)
CATH URET 5FR 28IN OPEN ENDED (CATHETERS) ×2 IMPLANT
CATH URET 5FR 70CM CONE TIP (BALLOONS) IMPLANT
CATH URET DUAL LUMEN 6-10FR 50 (CATHETERS) IMPLANT
CLOTH BEACON ORANGE TIMEOUT ST (SAFETY) ×2 IMPLANT
DRAPE CAMERA CLOSED 9X96 (DRAPES) ×2 IMPLANT
ELECT REM PT RETURN 9FT ADLT (ELECTROSURGICAL)
ELECTRODE REM PT RTRN 9FT ADLT (ELECTROSURGICAL) IMPLANT
FIBER LASER FLEXIVA 200 (UROLOGICAL SUPPLIES) IMPLANT
FIBER LASER FLEXIVA 365 (UROLOGICAL SUPPLIES) IMPLANT
GLOVE BIO SURGEON STRL SZ7 (GLOVE) ×2 IMPLANT
GLOVE BIOGEL M STER SZ 6 (GLOVE) ×2 IMPLANT
GLOVE BIOGEL PI IND STRL 6.5 (GLOVE) ×2 IMPLANT
GLOVE BIOGEL PI INDICATOR 6.5 (GLOVE) ×2
GLOVE ECLIPSE 7.0 STRL STRAW (GLOVE) ×2 IMPLANT
GLOVE INDICATOR 7.5 STRL GRN (GLOVE) ×2 IMPLANT
GOWN PREVENTION PLUS LG XLONG (DISPOSABLE) ×2 IMPLANT
GUIDEWIRE 0.038 PTFE COATED (WIRE) IMPLANT
GUIDEWIRE ANG ZIPWIRE 038X150 (WIRE) IMPLANT
GUIDEWIRE STR DUAL SENSOR (WIRE) ×4 IMPLANT
IV NS IRRIG 3000ML ARTHROMATIC (IV SOLUTION) ×4 IMPLANT
KIT BALLIN UROMAX 15FX10 (LABEL) IMPLANT
KIT BALLN UROMAX 15FX4 (MISCELLANEOUS) IMPLANT
KIT BALLN UROMAX 26 75X4 (MISCELLANEOUS)
PACK CYSTOSCOPY (CUSTOM PROCEDURE TRAY) ×2 IMPLANT
SET HIGH PRES BAL DIL (LABEL)
SHEATH ACCESS URETERAL 38CM (SHEATH) ×2 IMPLANT
SHEATH ACCESS URETERAL 54CM (SHEATH) IMPLANT
SHEATH URET ACCESS 12FR/35CM (UROLOGICAL SUPPLIES) IMPLANT
SHEATH URET ACCESS 12FR/55CM (UROLOGICAL SUPPLIES) IMPLANT
STENT POLARIS 5FRX24X.035 (STENTS) ×2 IMPLANT
SYRINGE IRR TOOMEY STRL 70CC (SYRINGE) IMPLANT

## 2013-10-30 NOTE — Transfer of Care (Signed)
Immediate Anesthesia Transfer of Care Note  Patient: [name]  Procedure(s) Performed: Procedure(s): CYSTOSCOPY WITH RETROGRADE PYELOGRAM, URETEROSCOPY AND STENT PLACEMENT (Left) HOLMIUM LASER APPLICATION (Left)  Patient Location: PACU  Anesthesia Type:General  Level of Consciousness: awake and oriented  Airway & Oxygen Therapy: Patient Spontanous Breathing and Patient connected to nasal cannula oxygen  Post-op Assessment: Report given to PACU RN  Post vital signs: Reviewed and stable  Complications: No apparent anesthesia complications

## 2013-10-30 NOTE — Op Note (Signed)
Urology Operative Report  Date of Procedure: 10/30/13  Surgeon: Natalia Leatherwood, MD Assistant:  None  Preoperative Diagnosis: Left ureter stone.  Postoperative Diagnosis: Left ureter stone. Left ureter stenosis.  Procedure(s): Left ureteroscopy with laser lithotripsy and stone removal (22 modified). Cystoscopy. Left ureter stent placement (5 x 24 polaris) Left retrograde pyelogram with interpretation.  Estimated blood loss: Minimal  Specimen: Stones provided to patient.   Drains: none  Complications: None  Findings: Left distal and mid ureter stenosis. Tortuous left proximal ureter.  History of present illness: 57 year old male presents with left symptomatic ureter stone. He was not able to pass the stone so he presents for ureteroscopy.   Procedure in detail: After informed consent was obtained, the patient was taken to the operating room. They were placed in the supine position. SCDs were turned on and in place. IV antibiotics were infused, and general anesthesia was induced. A timeout was performed in which the correct patient, surgical site, and procedure were identified and agreed upon by the team.  The patient was placed in a dorsolithotomy position, making sure to pad all pertinent neurovascular pressure points. A belladonna and opium suppository was placed into the rectum. The genitals were prepped and draped in the usual sterile fashion.  A rigid cystoscope was arrested urethra and into the bladder. The bladder was drained and then fully distended and evaluated in a systematic fashion to visualize the entire surface of the bladder. This was negative for bladder tumors. Attention was turned to the left ureter orifice. It was noted to be very narrow. I attempted to cannulate this with a 5 [place] ureter catheter but this was unsuccessful due to the narrow nature of the ureter. I then cannulated the left ureter orifice with a sensor tip wire in place this just into the distal  ureter. I then loaded a 5 [place] ureter catheter over this and then removed the wire. I injected contrast to obtain a left retrograde pyelogram. There was noted to be a filling defect that was consistent with a stone that was in the mid ureter that was pushed back up into the kidney with a retrograde pyelogram. I then placed 2 sensor wires into the left ureter orifice and into the left renal pelvis on fluoroscopy. The wire was secured as the safety wire with the other was used as the working wire. Because of the narrow nature of his distal ureter I elected to place a ureter access sheath. First I placed the obturator to an 11-13 [place] ureter access sheath over the wire under fluoroscopy. This will with ease into the distal ureter. I then placed the obturator and sheath together over the wire under fluoroscopy and removed the wire and obturator. I then inserted a flexible digital ureter scope. I was able to navigate this up until approximately the mid ureter was not able to navigate beyond and narrowing in the mid ureter. I then replaced a sensor wire removed the ureter scope and access sheath and attempted to place the obturator to the 11 French ureter scope past the narrowed area but this was not successful. I then placed the ureter access sheath and obturator to the mid ureter and remove the operator and wire. I attempted to place the digital rectal ureter scope through the access sheath but after I had gotten into the left ureter I could not pass any further. I did not know if this was due to narrowing of the access sheath or narrowing of the ureter. I attempted to  place a 12-14 ureter access sheath by first placing the obturator and then the sheath but this was not successful to be placed beyond the mid ureter. I then removed the obturator to the 12/14 ureter access sheath leaving the sheath in place in the mid ureter and navigated a nondigital flexible ureter scope through the area of stenosis in the left mid  ureter and into the kidney. Once in the kidney the kidney was evaluated in a systematic fashion. It was noted that the proximal left ureter was very tortuous. This could be due to the poor flow through the ureter scope versus a tortuous ureter. I found the stone that had been pushed back up into the kidney. The other stones were noted. Due to the narrow area in his ureter I did not feel that the stone would be will be removed without lithotripsy and therefore carried out lithotripsy with holmium laser filament which was 200  in size at a setting of 0.5 J and 20 Hz. This was broken in half. I grasped one end of the fragment and attempted to remove this but it became stuck in the mid ureter. I then let go the stone removed the 0 tip Nitinol basket and perform lithotripsy in the mid ureter and broke stone into smaller pieces and removed these. I was then able to navigate up into the kidney again and bring the stone down and out of the access sheath. It should be noted that this is a 22 modifier as I suspected this case should avoid taking 45 minutes but it took me over 90 minutes. After this was complete I placed the digital ureter scope to visualize the ureter. The ureter remained intact throughout the course of the ureter until the distal ureter in the intramural tunnel in which there was mucosal disruption. Because of this I elected to place a ureter stent. The safety wire was loaded through the cystoscope and a 5 x 24 Polaris stent was placed without tethering string. This had a good curl in the left renal pelvis and the loops of the stent were in the bladder. The bladder was drained and then I placed 10 cc of lidocaine jelly into the urethra.  This completed the procedure. He's placed back in supine position, anesthesia was reversed, and he was taken to the [facility] in stable condition.  I will reschedule his followup appointment for one month from now for stent removal. I will give him antibiotics to start before  his stent removal.  All counts were correct at the end of the case.

## 2013-10-30 NOTE — Anesthesia Preprocedure Evaluation (Signed)
Anesthesia Evaluation  Patient identified by MRN, date of birth, ID band Patient awake    Reviewed: Allergy & Precautions, H&P , NPO status , Patient's Chart, lab work & pertinent test results, reviewed documented beta blocker date and time   Airway Mallampati: II TM Distance: >3 FB Neck ROM: full    Dental no notable dental hx. (+) Teeth Intact and Dental Advisory Given   Pulmonary neg pulmonary ROS,  breath sounds clear to auscultation  Pulmonary exam normal       Cardiovascular Exercise Tolerance: Good hypertension, Pt. on medications Rhythm:regular Rate:Normal  Diastolic dysfunction   Neuro/Psych Essential tremor negative neurological ROS  negative psych ROS   GI/Hepatic negative GI ROS, Neg liver ROS,   Endo/Other  diabetes, Well Controlled, Type 2, Oral Hypoglycemic Agents  Renal/GU negative Renal ROS  negative genitourinary   Musculoskeletal   Abdominal   Peds  Hematology negative hematology ROS (+)   Anesthesia Other Findings   Reproductive/Obstetrics negative OB ROS                           Anesthesia Physical Anesthesia Plan  ASA: III  Anesthesia Plan: General   Post-op Pain Management:    Induction: Intravenous  Airway Management Planned: LMA  Additional Equipment:   Intra-op Plan:   Post-operative Plan:   Informed Consent: I have reviewed the patients History and Physical, chart, labs and discussed the procedure including the risks, benefits and alternatives for the proposed anesthesia with the patient or authorized representative who has indicated his/her understanding and acceptance.   Dental Advisory Given  Plan Discussed with: CRNA and Surgeon  Anesthesia Plan Comments:         Anesthesia Quick Evaluation

## 2013-10-30 NOTE — Anesthesia Procedure Notes (Signed)
Procedure Name: LMA Insertion Date/Time: 10/30/2013 1:10 PM Performed by: Maris Berger T Pre-anesthesia Checklist: Patient identified, Emergency Drugs available, Suction available and Patient being monitored Patient Re-evaluated:Patient Re-evaluated prior to inductionOxygen Delivery Method: Circle System Utilized Preoxygenation: Pre-oxygenation with 100% oxygen Intubation Type: IV induction Ventilation: Mask ventilation without difficulty LMA: LMA inserted LMA Size: 5.0 Number of attempts: 1 Airway Equipment and Method: bite block Placement Confirmation: positive ETCO2 Dental Injury: Teeth and Oropharynx as per pre-operative assessment

## 2013-10-30 NOTE — H&P (Signed)
Urology History and Physical Exam  CC: Left ureter stone.  HPI: 57 year old male presents today with a left ureter stone.  This was discovered due to left flank pain.  He continues to have flank pain that radiates down to his groin.  This is not associated with fever or gross hematuria.s+ at approximately the level of L3.  It is poorly visualized on KUB.  Renal ultrasound revealed remaining hydronephrosis on the left side as well.  We have discussed management  options along with the risks, benefits, alternatives, and likelihood of achieving goals.  He presents today for cystoscopy, left ureteroscopy, left retrograde pyelogram, laser lithotripsy, possible left ureter stent placement.  Urine culture 09/30/13 was negative for growth and UA 10/25/13 showed no signs of infection.  PMH: Past Medical History  Diagnosis Date  . Hyperlipidemia   . Hypertension   . Essential tremor     BENIGN--   . OA (osteoarthritis)   . Diverticulosis   . Diastolic dysfunction, left ventricle     GRADE 2  . Left ureteral calculus   . Urgency of urination     PSH: Past Surgical History  Procedure Laterality Date  . Vasectomy reversal  1993  . Knee arthroscopy w/ debridement Right X3  LAST ONE 12-31-2008  . Anterior cervical decomp/discectomy fusion  04-24-2008    C5  -- C6  . Shoulder arthroscopy Right X2  LAST ONE 04-25-2006  . Shoulder arthroscopy with rotator cuff repair and subacromial decompression Left 05-20-2010    AND DEBRIDEMENT BICEP TENDON/ OPEN DISTAL CLAVICAL RESECTON  . Tonsillectomy  AGE 69  . Cardiac catheterization  06-21-2010  [facility]    RCA 25%/ MINIMAL DISEASE LM, CX  . Cardiovascular stress test  06-10-2010    NORMAL NUCLEAR STUDY BUT  CLINICALL ABNORMAL (CHEST PAIN, ST ABNORMALITY, HYPOTENSION)/   EF 61%  . Transthoracic echocardiogram  07-21-2010    GRADE II DIASTOLIC DYSFUNCTION/  EF 55%/  MILD DILATED AORTIC ROOT    Allergies: Allergies  Allergen Reactions  .  Celebrex [Celecoxib] Other (See Comments)    GI UPSET  . Clindamycin Hcl Other (See Comments)    GI UPSET  . Codeine Other (See Comments)    GI UPSET  . Erythromycin Other (See Comments)    GI UPSET    Medications: No prescriptions prior to admission     Social History: History   Social History  . Marital Status: Married    Spouse Name: N/A    Number of Children: N/A  . Years of Education: N/A   Occupational History  . Not on file.   Social History Main Topics  . Smoking status: Former Smoker -- 1.00 packs/day for 20 years    Types: Cigarettes    Quit date: 12/26/2000  . Smokeless tobacco: Never Used  . Alcohol Use: 1.0 oz/week    2 drink(s) per week  . Drug Use: No  . Sexual Activity: Not on file   Other Topics Concern  . Not on file   Social History Narrative  . No narrative on file    Family History: Family History  Problem Relation Age of Onset  . Arthritis Mother   . Hypertension Mother   . Hyperlipidemia Mother   . Hypertension Father   . Hyperlipidemia Father     Review of Systems: Positive: Left flank pain. Negative: Fever, SOB, or chest pain.  A further 10 point review of systems was negative except what is listed in the HPI.  Physical Exam: Filed Vitals:   10/30/13 1150  BP: 137/91  Pulse: 72  Temp: 97.2 F (36.2 C)  Resp: 16    General: No acute distress.  Awake. Head:  Normocephalic.  Atraumatic. ENT:  EOMI.  Mucous membranes moist Neck:  Supple.  No lymphadenopathy. CV:  S1 present. S2 present. Regular rate. Pulmonary: Equal effort bilaterally.  Clear to auscultation bilaterally. Abdomen: Soft.  Non- tender to palpation. Skin:  Normal turgor.  No visible rash. Extremity: No gross deformity of bilateral upper extremities.  No gross deformity of    bilateral lower extremities. Neurologic: Alert. Appropriate mood.    Studies:  No results found for this basename: HGB, WBC, PLT,  in the last 72 hours  No results found for  this basename: NA, K, CL, CO2, BUN, CREATININE, CALCIUM, MAGNESIUM, GFRNONAA, GFRAA,  in the last 72 hours   No results found for this basename: PT, INR, APTT,  in the last 72 hours   No components found with this basename: ABG,     Assessment:  Left ureter stone.  Plan: To OR for cystoscopy, left ureteroscopy, left retrograde pyelogram, laser lithotripsy, possible left ureter stent placement.

## 2013-10-30 NOTE — Anesthesia Postprocedure Evaluation (Signed)
Anesthesia Post Note  Patient: Isaiah Dunn  Procedure(s) Performed: Procedure(s) (LRB): CYSTOSCOPY WITH RETROGRADE PYELOGRAM, URETEROSCOPY AND STENT PLACEMENT (Left) HOLMIUM LASER APPLICATION (Left)  Anesthesia type: General  Patient location: PACU  Post pain: Pain level controlled  Post assessment: Post-op Vital signs reviewed  Last Vitals: BP 122/74  Pulse 69  Temp(Src) 36.4 C (Oral)  Resp 15  Ht 5\' 10"  (1.778 m)  Wt 189 lb (85.73 kg)  BMI 27.12 kg/m2  SpO2 100%  Post vital signs: Reviewed  Level of consciousness: sedated  Complications: No apparent anesthesia complications

## 2013-10-31 ENCOUNTER — Other Ambulatory Visit: Payer: Self-pay

## 2013-10-31 ENCOUNTER — Encounter (HOSPITAL_BASED_OUTPATIENT_CLINIC_OR_DEPARTMENT_OTHER): Payer: Self-pay | Admitting: Urology

## 2013-11-09 ENCOUNTER — Emergency Department (HOSPITAL_COMMUNITY): Payer: BC Managed Care – PPO

## 2013-11-09 ENCOUNTER — Inpatient Hospital Stay (HOSPITAL_COMMUNITY)
Admission: EM | Admit: 2013-11-09 | Discharge: 2013-11-14 | DRG: 872 | Disposition: A | Discharge status: Home / Self Care | Payer: BC Managed Care – PPO | Attending: Urology | Admitting: Urology

## 2013-11-09 ENCOUNTER — Encounter (HOSPITAL_COMMUNITY): Payer: Self-pay | Admitting: Emergency Medicine

## 2013-11-09 DIAGNOSIS — E785 Hyperlipidemia, unspecified: Secondary | ICD-10-CM | POA: Diagnosis present

## 2013-11-09 DIAGNOSIS — N390 Urinary tract infection, site not specified: Secondary | ICD-10-CM | POA: Diagnosis present

## 2013-11-09 DIAGNOSIS — R6510 Systemic inflammatory response syndrome (SIRS) of non-infectious origin without acute organ dysfunction: Secondary | ICD-10-CM

## 2013-11-09 DIAGNOSIS — G250 Essential tremor: Secondary | ICD-10-CM | POA: Diagnosis present

## 2013-11-09 DIAGNOSIS — E119 Type 2 diabetes mellitus without complications: Secondary | ICD-10-CM | POA: Diagnosis present

## 2013-11-09 DIAGNOSIS — Z79899 Other long term (current) drug therapy: Secondary | ICD-10-CM

## 2013-11-09 DIAGNOSIS — Z87891 Personal history of nicotine dependence: Secondary | ICD-10-CM

## 2013-11-09 DIAGNOSIS — Z8249 Family history of ischemic heart disease and other diseases of the circulatory system: Secondary | ICD-10-CM

## 2013-11-09 DIAGNOSIS — M1990 Unspecified osteoarthritis, unspecified site: Secondary | ICD-10-CM | POA: Diagnosis present

## 2013-11-09 DIAGNOSIS — A409 Streptococcal sepsis, unspecified: Principal | ICD-10-CM | POA: Diagnosis present

## 2013-11-09 DIAGNOSIS — R509 Fever, unspecified: Secondary | ICD-10-CM

## 2013-11-09 DIAGNOSIS — A419 Sepsis, unspecified organism: Secondary | ICD-10-CM | POA: Diagnosis present

## 2013-11-09 DIAGNOSIS — I10 Essential (primary) hypertension: Secondary | ICD-10-CM | POA: Diagnosis present

## 2013-11-09 DIAGNOSIS — R000 Tachycardia, unspecified: Secondary | ICD-10-CM | POA: Diagnosis not present

## 2013-11-09 DIAGNOSIS — Z7982 Long term (current) use of aspirin: Secondary | ICD-10-CM

## 2013-11-09 DIAGNOSIS — Z87442 Personal history of urinary calculi: Secondary | ICD-10-CM

## 2013-11-09 LAB — URINALYSIS, ROUTINE W REFLEX MICROSCOPIC
Bilirubin Urine: NEGATIVE
Ketones, ur: NEGATIVE mg/dL
Nitrite: NEGATIVE
Protein, ur: 100 mg/dL — AB
Specific Gravity, Urine: 1.018 (ref 1.005–1.030)
Urobilinogen, UA: 0.2 mg/dL (ref 0.0–1.0)

## 2013-11-09 LAB — COMPREHENSIVE METABOLIC PANEL
ALT: 23 U/L (ref 0–53)
Alkaline Phosphatase: 78 U/L (ref 39–117)
BUN: 21 mg/dL (ref 6–23)
CO2: 24 mEq/L (ref 19–32)
Calcium: 9 mg/dL (ref 8.4–10.5)
Chloride: 96 mEq/L (ref 96–112)
GFR calc Af Amer: 56 mL/min — ABNORMAL LOW (ref >90)
GFR calc non Af Amer: 48 mL/min — ABNORMAL LOW (ref >90)
Glucose, Bld: 142 mg/dL — ABNORMAL HIGH (ref 70–99)
Sodium: 132 mEq/L — ABNORMAL LOW (ref 135–145)
Total Bilirubin: 1.2 mg/dL (ref 0.3–1.2)
Total Protein: 7.3 g/dL (ref 6.0–8.3)

## 2013-11-09 LAB — URINE MICROSCOPIC-ADD ON

## 2013-11-09 LAB — GLUCOSE, CAPILLARY: Glucose-Capillary: 113 mg/dL — ABNORMAL HIGH (ref 70–99)

## 2013-11-09 LAB — CBC WITH DIFFERENTIAL/PLATELET
Basophils Relative: 0 % (ref 0–1)
Eosinophils Absolute: 0 10*3/uL (ref 0.0–0.7)
Eosinophils Relative: 0 % (ref 0–5)
HCT: 37.2 % — ABNORMAL LOW (ref 39.0–52.0)
Hemoglobin: 12.3 g/dL — ABNORMAL LOW (ref 13.0–17.0)
Lymphs Abs: 2.1 10*3/uL (ref 0.7–4.0)
MCH: 27.6 pg (ref 26.0–34.0)
MCHC: 33.1 g/dL (ref 30.0–36.0)
MCV: 83.6 fL (ref 78.0–100.0)
Monocytes Absolute: 1.9 10*3/uL — ABNORMAL HIGH (ref 0.1–1.0)
Neutro Abs: 17.2 10*3/uL — ABNORMAL HIGH (ref 1.7–7.7)
Neutrophils Relative %: 81 % — ABNORMAL HIGH (ref 43–77)
RBC: 4.45 MIL/uL (ref 4.22–5.81)
WBC: 21.2 10*3/uL — ABNORMAL HIGH (ref 4.0–10.5)

## 2013-11-09 MED ORDER — TAMSULOSIN HCL 0.4 MG PO CAPS
0.4000 mg | ORAL_CAPSULE | Freq: Every day | ORAL | Status: DC
  Administered 2013-11-09 – 2013-11-14 (×6): 0.4 mg via ORAL
  Filled 2013-11-09 (×6): qty 1

## 2013-11-09 MED ORDER — CIPROFLOXACIN HCL 500 MG PO TABS
500.0000 mg | ORAL_TABLET | Freq: Two times a day (BID) | ORAL | Status: DC
  Administered 2013-11-09 – 2013-11-10 (×3): 500 mg via ORAL
  Filled 2013-11-09 (×7): qty 1

## 2013-11-09 MED ORDER — HEPARIN SODIUM (PORCINE) 5000 UNIT/ML IJ SOLN
5000.0000 [IU] | Freq: Three times a day (TID) | INTRAMUSCULAR | Status: DC
  Administered 2013-11-09 – 2013-11-14 (×14): 5000 [IU] via SUBCUTANEOUS
  Filled 2013-11-09 (×17): qty 1

## 2013-11-09 MED ORDER — ATORVASTATIN CALCIUM 10 MG PO TABS
10.0000 mg | ORAL_TABLET | Freq: Every day | ORAL | Status: DC
  Administered 2013-11-09 – 2013-11-13 (×5): 10 mg via ORAL
  Filled 2013-11-09 (×6): qty 1

## 2013-11-09 MED ORDER — METFORMIN HCL ER 500 MG PO TB24
1000.0000 mg | ORAL_TABLET | Freq: Every day | ORAL | Status: DC
  Administered 2013-11-10: 1000 mg via ORAL
  Filled 2013-11-09 (×4): qty 2

## 2013-11-09 MED ORDER — DULOXETINE HCL 60 MG PO CPEP
60.0000 mg | ORAL_CAPSULE | Freq: Every day | ORAL | Status: DC
  Administered 2013-11-10 – 2013-11-14 (×5): 60 mg via ORAL
  Filled 2013-11-09 (×5): qty 1

## 2013-11-09 MED ORDER — PHENAZOPYRIDINE HCL 100 MG PO TABS
100.0000 mg | ORAL_TABLET | Freq: Three times a day (TID) | ORAL | Status: DC | PRN
Start: 2013-11-09 — End: 2013-11-14
  Filled 2013-11-09: qty 1

## 2013-11-09 MED ORDER — DOCUSATE SODIUM 100 MG PO CAPS
100.0000 mg | ORAL_CAPSULE | Freq: Two times a day (BID) | ORAL | Status: DC
  Administered 2013-11-10 – 2013-11-13 (×5): 100 mg via ORAL
  Filled 2013-11-09 (×11): qty 1

## 2013-11-09 MED ORDER — SODIUM CHLORIDE 0.9 % IV BOLUS (SEPSIS)
1000.0000 mL | Freq: Once | INTRAVENOUS | Status: AC
  Administered 2013-11-09: 1000 mL via INTRAVENOUS

## 2013-11-09 MED ORDER — POLYETHYLENE GLYCOL 3350 17 G PO PACK
17.0000 g | PACK | ORAL | Status: DC
  Administered 2013-11-13: 17 g via ORAL
  Filled 2013-11-09 (×3): qty 1

## 2013-11-09 MED ORDER — SODIUM CHLORIDE 0.45 % IV SOLN
INTRAVENOUS | Status: DC
  Administered 2013-11-09 – 2013-11-10 (×2): via INTRAVENOUS

## 2013-11-09 MED ORDER — POLYETHYLENE GLYCOL 3350 17 GM/SCOOP PO POWD
17.0000 g | ORAL | Status: DC
  Filled 2013-11-09: qty 255

## 2013-11-09 MED ORDER — HYDROMORPHONE HCL PF 1 MG/ML IJ SOLN
1.0000 mg | INTRAMUSCULAR | Status: DC | PRN
  Filled 2013-11-09: qty 1

## 2013-11-09 MED ORDER — BENAZEPRIL HCL 5 MG PO TABS
2.5000 mg | ORAL_TABLET | Freq: Every evening | ORAL | Status: DC
  Administered 2013-11-09: 23:00:00 via ORAL
  Administered 2013-11-10 – 2013-11-13 (×4): 2.5 mg via ORAL
  Filled 2013-11-09 (×6): qty 1

## 2013-11-09 MED ORDER — HYDROMORPHONE HCL PF 1 MG/ML IJ SOLN
0.5000 mg | INTRAMUSCULAR | Status: DC | PRN
  Administered 2013-11-09 – 2013-11-10 (×5): 1 mg via INTRAVENOUS
  Filled 2013-11-09 (×5): qty 1

## 2013-11-09 MED ORDER — OXYCODONE-ACETAMINOPHEN 5-325 MG PO TABS
1.0000 | ORAL_TABLET | Freq: Once | ORAL | Status: AC
  Administered 2013-11-09: 1 via ORAL
  Filled 2013-11-09: qty 1

## 2013-11-09 MED ORDER — OXYBUTYNIN CHLORIDE 5 MG PO TABS
5.0000 mg | ORAL_TABLET | Freq: Four times a day (QID) | ORAL | Status: DC | PRN
  Filled 2013-11-09 (×2): qty 1

## 2013-11-09 MED ORDER — DEXTROSE 5 % IV SOLN
1.0000 g | Freq: Once | INTRAVENOUS | Status: AC
  Administered 2013-11-09: 1 g via INTRAVENOUS
  Filled 2013-11-09: qty 10

## 2013-11-09 MED ORDER — ONDANSETRON HCL 4 MG/2ML IJ SOLN: 4.0000 mg | INTRAMUSCULAR | Status: DC | PRN

## 2013-11-09 NOTE — ED Notes (Signed)
Pt reports taking tylenol around 1200 today

## 2013-11-09 NOTE — ED Notes (Signed)
Pt does not want any pain medication at this time.

## 2013-11-09 NOTE — ED Notes (Signed)
Ultrasound in room with patient.

## 2013-11-09 NOTE — ED Provider Notes (Addendum)
CSN: [id number]     Arrival date & time 11/09/13  1452 History   First MD Initiated Contact with Patient 11/09/13 1523     Chief Complaint  Patient presents with  . Fever  . Chills  . Post-op Problem  . Loss of Consciousness   (Consider location/radiation/quality/duration/timing/severity/associated sxs/prior Treatment) HPI Patient presents with concern of fever, chills, abdominal pain, nausea, and one episode of syncope. Patient has a notable history of the left ureteral stent placement earlier this week.  He states that on discharge she was initially well.  3 days ago he began having increasing pain about the left lateral abdomen.  There is concurrent nausea.  Since onset the pain has been persistent, with fever/chills. Minimal relief with Dilaudid. Today, in the hours prior to ED arrival the patient had an episode of non-prodromal syncope.  Patient had no chest pain prior to or after the event. He did have mild lightheadedness after the event. A subsequent confusion, disorientation, and a prolonged return to baseline. Patient has no seizure history. Patient has no history of dysrhythmia.   Past Medical History  Diagnosis Date  . Hyperlipidemia   . Hypertension   . Essential tremor     BENIGN--   . OA (osteoarthritis)   . Diverticulosis   . Diastolic dysfunction, left ventricle     GRADE 2  . Left ureteral calculus   . Urgency of urination    Past Surgical History  Procedure Laterality Date  . Vasectomy reversal  1993  . Knee arthroscopy w/ debridement Right X3  LAST ONE 12-31-2008  . Anterior cervical decomp/discectomy fusion  04-24-2008    C5  -- C6  . Shoulder arthroscopy Right X2  LAST ONE 04-25-2006  . Shoulder arthroscopy with rotator cuff repair and subacromial decompression Left 05-20-2010    AND DEBRIDEMENT BICEP TENDON/ OPEN DISTAL CLAVICAL RESECTON  . Tonsillectomy  AGE 33  . Cardiac catheterization  06-21-2010  [facility]    RCA 25%/ MINIMAL DISEASE  LM, CX  . Cardiovascular stress test  06-10-2010    NORMAL NUCLEAR STUDY BUT  CLINICALL ABNORMAL (CHEST PAIN, ST ABNORMALITY, HYPOTENSION)/   EF 61%  . Transthoracic echocardiogram  07-21-2010    GRADE II DIASTOLIC DYSFUNCTION/  EF 55%/  MILD DILATED AORTIC ROOT  . Cystoscopy with retrograde pyelogram, ureteroscopy and stent placement Left 10/30/2013    Procedure: CYSTOSCOPY WITH RETROGRADE PYELOGRAM, URETEROSCOPY AND STENT PLACEMENT;  Surgeon: Milford Cage, MD;  Location: [facility];  Service: Urology;  Laterality: Left;  . Holmium laser application Left 10/30/2013    Procedure: HOLMIUM LASER APPLICATION;  Surgeon: Milford Cage, MD;  Location: [facility];  Service: Urology;  Laterality: Left;   Family History  Problem Relation Age of Onset  . Arthritis Mother   . Hypertension Mother   . Hyperlipidemia Mother   . Hypertension Father   . Hyperlipidemia Father    History  Substance Use Topics  . Smoking status: Former Smoker -- 1.00 packs/day for 20 years    Types: Cigarettes    Quit date: 12/26/2000  . Smokeless tobacco: Never Used  . Alcohol Use: 1.0 oz/week    2 drink(s) per week    Review of Systems  Constitutional:       Per HPI, otherwise negative  HENT:       Per HPI, otherwise negative  Respiratory:       Per HPI, otherwise negative  Cardiovascular:  Per HPI, otherwise negative  Gastrointestinal: Positive for nausea and abdominal pain. Negative for vomiting.  Endocrine:       Negative aside from HPI  Genitourinary:       Neg aside from HPI   Musculoskeletal:       Per HPI, otherwise negative  Skin: Negative.   Neurological: Negative for syncope.    Allergies  Celebrex; Clindamycin hcl; Codeine; and Erythromycin  Home Medications   Current Outpatient Rx  Name  Route  Sig  Dispense  Refill  . aspirin 81 MG chewable tablet   Oral   Chew 81 mg by mouth daily.         Marland Kitchen atorvastatin (LIPITOR) 10 MG  tablet      TAKE 1 TABLET EVERY DAY--  TAKES IN PM         . benazepril (LOTENSIN) 5 MG tablet   Oral   Take 2.5 mg by mouth every evening.          . cephALEXin (KEFLEX) 500 MG capsule   Oral   Take 1 capsule (500 mg total) by mouth 3 (three) times daily. Begin the day before your stent removal.   9 capsule   0   . diazepam (VALIUM) 5 MG tablet   Oral   Take 2.5 mg by mouth every 8 (eight) hours as needed (tremors). For tremors         . DULoxetine (CYMBALTA) 60 MG capsule      TAKE ONE CAPSULE EVERY DAY   30 capsule   6   . HYDROmorphone (DILAUDID) 4 MG tablet   Oral   Take 1 tablet (4 mg total) by mouth every 4 (four) hours as needed for moderate pain.   30 tablet   0   . hyoscyamine (LEVSIN, ANASPAZ) 0.125 MG tablet   Oral   Take 1 tablet (0.125 mg total) by mouth every 4 (four) hours as needed (bladder spasms).   40 tablet   4   . metFORMIN (GLUCOPHAGE-XR) 500 MG 24 hr tablet   Oral   Take 1,000 mg by mouth daily with breakfast.         . Multiple Vitamin (MULTIVITAMIN WITH MINERALS) TABS tablet   Oral   Take 1 tablet by mouth once a week.          Marland Kitchen oxybutynin (DITROPAN) 5 MG tablet   Oral   Take 1 tablet (5 mg total) by mouth every 6 (six) hours as needed for bladder spasms.   40 tablet   4   . phenazopyridine (PYRIDIUM) 100 MG tablet   Oral   Take 1 tablet (100 mg total) by mouth every 8 (eight) hours as needed for pain (Burning urination.  Will turn urine and body fluids orange.).   30 tablet   6   . polyethylene glycol powder (GLYCOLAX/MIRALAX) powder   Oral   Take 17 g by mouth every other day.         . tamsulosin (FLOMAX) 0.4 MG CAPS capsule   Oral   Take 1 capsule (0.4 mg total) by mouth daily.   4 capsule   0    BP 100/63  Pulse 91  Temp(Src) 98.9 F (37.2 C) (Oral)  Resp 16  SpO2 100% Physical Exam  Nursing note and vitals reviewed. Constitutional: He is oriented to person, place, and time. He appears  well-developed. No distress.  HENT:  Head: Normocephalic and atraumatic.  Eyes: Conjunctivae and EOM are normal.  Cardiovascular:  Normal rate and regular rhythm.   Pulmonary/Chest: Effort normal. No stridor. No respiratory distress.  Abdominal: Normal appearance. He exhibits no distension. There is tenderness in the periumbilical area, left upper quadrant and left lower quadrant. There is guarding. There is no rigidity and no rebound.  Musculoskeletal: He exhibits no edema.  Neurological: He is alert and oriented to person, place, and time.  Skin: Skin is warm and dry.  Psychiatric: He has a normal mood and affect.    ED Course  Procedures (including critical care time) Labs Review Labs Reviewed  CBC WITH DIFFERENTIAL  COMPREHENSIVE METABOLIC PANEL  URINALYSIS, ROUTINE W REFLEX MICROSCOPIC   Imaging Review No results found.  EKG Interpretation     Ventricular Rate:  84 PR Interval:  136 QRS Duration: 90 QT Interval:  374 QTC Calculation: 442 R Axis:   30 Text Interpretation:  Sinus rhythm Normal ECG           I reviewed the patient's chart after the initial encounter.  5:14 PM I spoke with our urology team - [place] ordered.   7:29 PM Patient continues to c/o pain, and he spiked a temp. He will receive meds.  Renal US w no hydro  I spoke with the patient's urology team.  MDM   1. Fever      This patient presents with recent placement of ureteral stent, now with pain, nausea, subjective fever.  During the patient's emergency department course he had objective fever development.  Given his leukocytosis, although he had ultrasound was largely reassuring, he was admitted for further evaluation and management after initiation of antibiotics.  Gerhard Munch, MD 11/09/13 Corky Crafts  Gerhard Munch, MD 11/09/13 [phone]

## 2013-11-09 NOTE — ED Notes (Addendum)
Pt had stents placed in his kidney on 11/5.  Since this past Thursday c/o fever, chills, and lt flank pain.  Sent here from [facility] for possible urinary sepsis.  Pt also states that he passed out this morning.  Denies that it was from pain.  Just states he was standing in the kitchen and passed out.

## 2013-11-10 ENCOUNTER — Encounter (HOSPITAL_COMMUNITY): Payer: Self-pay | Admitting: *Deleted

## 2013-11-10 LAB — CBC WITH DIFFERENTIAL/PLATELET
Basophils Absolute: 0 10*3/uL (ref 0.0–0.1)
Basophils Relative: 0 % (ref 0–1)
HCT: 36.5 % — ABNORMAL LOW (ref 39.0–52.0)
Hemoglobin: 11.9 g/dL — ABNORMAL LOW (ref 13.0–17.0)
Lymphocytes Relative: 8 % — ABNORMAL LOW (ref 12–46)
Lymphs Abs: 1.4 10*3/uL (ref 0.7–4.0)
MCV: 84.1 fL (ref 78.0–100.0)
Monocytes Relative: 8 % (ref 3–12)
Neutro Abs: 14.5 10*3/uL — ABNORMAL HIGH (ref 1.7–7.7)
Neutrophils Relative %: 84 % — ABNORMAL HIGH (ref 43–77)
RBC: 4.34 MIL/uL (ref 4.22–5.81)
RDW: 14.5 % (ref 11.5–15.5)
WBC: 17.3 10*3/uL — ABNORMAL HIGH (ref 4.0–10.5)

## 2013-11-10 LAB — BASIC METABOLIC PANEL
CO2: 24 mEq/L (ref 19–32)
Calcium: 8.7 mg/dL (ref 8.4–10.5)
Creatinine, Ser: 1.24 mg/dL (ref 0.50–1.35)
GFR calc non Af Amer: 63 mL/min — ABNORMAL LOW (ref >90)
Glucose, Bld: 163 mg/dL — ABNORMAL HIGH (ref 70–99)
Potassium: 3.6 mEq/L (ref 3.5–5.1)
Sodium: 131 mEq/L — ABNORMAL LOW (ref 135–145)

## 2013-11-10 MED ORDER — INSULIN ASPART 100 UNIT/ML ~~LOC~~ SOLN
0.0000 [IU] | Freq: Three times a day (TID) | SUBCUTANEOUS | Status: DC
Start: 2013-11-10 — End: 2013-11-14
  Administered 2013-11-10 – 2013-11-13 (×6): 1 [IU] via SUBCUTANEOUS

## 2013-11-10 MED ORDER — OXYCODONE-ACETAMINOPHEN 5-325 MG PO TABS
1.0000 | ORAL_TABLET | ORAL | Status: DC | PRN
  Administered 2013-11-10 – 2013-11-11 (×3): 2 via ORAL
  Filled 2013-11-10 (×3): qty 2

## 2013-11-10 MED ORDER — SODIUM CHLORIDE 0.9 % IV SOLN
INTRAVENOUS | Status: DC
  Administered 2013-11-10 – 2013-11-11 (×3): via INTRAVENOUS
  Administered 2013-11-11: 125 mL/h via INTRAVENOUS
  Administered 2013-11-12 – 2013-11-13 (×3): via INTRAVENOUS

## 2013-11-10 NOTE — H&P (Signed)
Urology History and Physical Exam  CC: Fever, flank pain  HPI: 57 year old male underwent left ureteroscopic stone management on 10/30/2013 by Dr. Natalia Leatherwood. This was done as an outpatient. The patient went home without complications following this outpatient procedure. Between November 5 and 3 days ago, he experienced no real difficulty other than some stent discomfort. On the 13th, in the evening, he developed chills, and a fever of just over 100. He additionally has some lower abdominal discomfort. This persisted on the 14th, and he called yesterday with continued chills, his fever of 100, and abdominal pain. He just did not feel well. In between this phone call with me and presentation to the emergency room for evaluation, he had a syncopal episode in his kitchen while standing.  Evaluation in the emergency room revealed the patient to have a temperature of 100.1, and significant leukocytosis. I recommended a renal ultrasound which, by report showed no hydronephrosis. Because of the patient's low-grade fever, significant pyuria, leukocytosis and his diabetes, he was admitted for further management.  PMH: Past Medical History  Diagnosis Date  . Hyperlipidemia   . Hypertension   . Essential tremor     BENIGN--   . OA (osteoarthritis)   . Diverticulosis   . Diastolic dysfunction, left ventricle     GRADE 2  . Left ureteral calculus   . Urgency of urination     PSH: Past Surgical History  Procedure Laterality Date  . Vasectomy reversal  1993  . Knee arthroscopy w/ debridement Right X3  LAST ONE 12-31-2008  . Anterior cervical decomp/discectomy fusion  04-24-2008    C5  -- C6  . Shoulder arthroscopy Right X2  LAST ONE 04-25-2006  . Shoulder arthroscopy with rotator cuff repair and subacromial decompression Left 05-20-2010    AND DEBRIDEMENT BICEP TENDON/ OPEN DISTAL CLAVICAL RESECTON  . Tonsillectomy  AGE 31  . Cardiac catheterization  06-21-2010  [facility]    RCA 25%/  MINIMAL DISEASE LM, CX  . Cardiovascular stress test  06-10-2010    NORMAL NUCLEAR STUDY BUT  CLINICALL ABNORMAL (CHEST PAIN, ST ABNORMALITY, HYPOTENSION)/   EF 61%  . Transthoracic echocardiogram  07-21-2010    GRADE II DIASTOLIC DYSFUNCTION/  EF 55%/  MILD DILATED AORTIC ROOT  . Cystoscopy with retrograde pyelogram, ureteroscopy and stent placement Left 10/30/2013    Procedure: CYSTOSCOPY WITH RETROGRADE PYELOGRAM, URETEROSCOPY AND STENT PLACEMENT;  Surgeon: Milford Cage, MD;  Location: [facility];  Service: Urology;  Laterality: Left;  . Holmium laser application Left 10/30/2013    Procedure: HOLMIUM LASER APPLICATION;  Surgeon: Milford Cage, MD;  Location: [facility];  Service: Urology;  Laterality: Left;    Allergies: Allergies  Allergen Reactions  . Celebrex [Celecoxib] Other (See Comments)    GI UPSET  . Clindamycin Hcl Other (See Comments)    GI UPSET  . Codeine Other (See Comments)    GI UPSET  . Erythromycin Other (See Comments)    GI UPSET    Medications: Prescriptions prior to admission  Medication Sig Dispense Refill  . acetaminophen (TYLENOL) 500 MG tablet Take 500 mg by mouth every 6 (six) hours as needed.      Marland Kitchen aspirin 81 MG chewable tablet Chew 81 mg by mouth daily.      Marland Kitchen atorvastatin (LIPITOR) 10 MG tablet Take 10 mg by mouth daily.      . benazepril (LOTENSIN) 5 MG tablet Take 2.5 mg by mouth every  evening.       . DULoxetine (CYMBALTA) 60 MG capsule Take 60 mg by mouth daily.      Marland Kitchen HYDROmorphone (DILAUDID) 4 MG tablet Take 1 tablet (4 mg total) by mouth every 4 (four) hours as needed for moderate pain.  30 tablet  0  . hyoscyamine (LEVSIN, ANASPAZ) 0.125 MG tablet Take 1 tablet (0.125 mg total) by mouth every 4 (four) hours as needed (bladder spasms).  40 tablet  4  . metFORMIN (GLUCOPHAGE-XR) 500 MG 24 hr tablet Take 1,000 mg by mouth daily with breakfast.      . Multiple Vitamin (MULTIVITAMIN WITH MINERALS)  TABS tablet Take 1 tablet by mouth once a week.       Marland Kitchen oxybutynin (DITROPAN) 5 MG tablet Take 1 tablet (5 mg total) by mouth every 6 (six) hours as needed for bladder spasms.  40 tablet  4  . phenazopyridine (PYRIDIUM) 100 MG tablet Take 1 tablet (100 mg total) by mouth every 8 (eight) hours as needed for pain (Burning urination.  Will turn urine and body fluids orange.).  30 tablet  6  . polyethylene glycol powder (GLYCOLAX/MIRALAX) powder Take 17 g by mouth every other day.      . tamsulosin (FLOMAX) 0.4 MG CAPS capsule Take 1 capsule (0.4 mg total) by mouth daily.  4 capsule  0     Social History: History   Social History  . Marital Status: Married    Spouse Name: N/A    Number of Children: N/A  . Years of Education: N/A   Occupational History  . Not on file.   Social History Main Topics  . Smoking status: Former Smoker -- 1.00 packs/day for 20 years    Types: Cigarettes    Quit date: 12/26/2000  . Smokeless tobacco: Never Used  . Alcohol Use: 1.0 oz/week    2 drink(s) per week  . Drug Use: No  . Sexual Activity: Not on file   Other Topics Concern  . Not on file   Social History Narrative  . No narrative on file    Family History: Family History  Problem Relation Age of Onset  . Arthritis Mother   . Hypertension Mother   . Hyperlipidemia Mother   . Hypertension Father   . Hyperlipidemia Father     Review of Systems: Positive: Fever, chills, abdominal/flank discomfort, frequency, urgency and mild hematuria Negative:   A further 10 point review of systems was negative except what is listed in the HPI.  Physical Exam: @VITALS2 @ General: No acute distress.  Awake. Head:  Normocephalic.  Atraumatic. ENT:  EOMI.  Mucous membranes moist Neck:  Supple.  No lymphadenopathy. CV:  S1 present. S2 present. Regular rate. Pulmonary: Equal effort bilaterally.  Clear to auscultation bilaterally. Abdomen: Soft.  Minimally tender in left lower quadrant and left CVA region.  No rebound or guarding noted. Skin:  Normal turgor.  No visible rash. Extremity: No gross deformity of bilateral upper extremities.  No gross deformity of bilateral lower extremities. Neurologic: Alert. Appropriate mood.  Penis:  Circumcised.  No lesions. Urethra:           Orthotopic meatus. Scrotum: No lesions.  No ecchymosis.  No erythema. Testicles: Descended bilaterally.  No masses bilaterally. Epididymis: Palpable bilaterally.  Non Tender to palpation.  Studies:  Recent Labs     11/09/13  1600  HGB  12.3*  WBC  21.2*  PLT  219    Recent Labs  11/09/13  1600  NA  132*  K  3.8  CL  96  CO2  24  BUN  21  CREATININE  1.55*  CALCIUM  9.0  GFRNONAA  48*  GFRAA  56*     No results found for this basename: PT, INR, APTT,  in the last 72 hours   No components found with this basename: ABG,  o Renal ultrasound images were reviewed. There is slight left-sided pelvocaliectasis. Stent is adequately positioned in the bladder which is minimally distended. KUB revealed adequate positioning of the stent.  Assessment:  1. Probable febrile urinary tract infection, following left ureteroscopic stone management. The patient does have a stent in  2. Mild pelvocaliectasis. More than likely, the patient's stent is appropriately draining. This pelvocaliectasis may be mild residual hydronephrosis  Plan: 1. The patient will be placed on adequate antibiotic management  2. We will follow his glucose control carefully  3. If he does not improve significantly on antibiotic management, consider repeat CT scan with contrast to assess for proper stent drainage and if it appears the stent is not draining properly, performing stent change.

## 2013-11-10 NOTE — Progress Notes (Signed)
Subjective: Patient reports continued chills. He is not having significant pain this morning.  Objective: Vital signs in last 24 hours: Temp:  [98.6 F (37 C)-100.2 F (37.9 C)] 98.6 F (37 C) (11/16 0500) Pulse Rate:  [86-94] 94 (11/16 0500) Resp:  [16-20] 16 (11/16 0500) BP: (100-114)/(60-66) 114/65 mmHg (11/16 0500) SpO2:  [98 %-100 %] 98 % (11/16 0500) Weight:  [84.8 kg (186 lb 15.2 oz)] 84.8 kg (186 lb 15.2 oz) (11/15 2045)  Intake/Output from previous day: 11/15 0701 - 11/16 0700 In: 1560 [P.O.:960; I.V.:600] Out: 950 [Urine:950] Intake/Output this shift: Total I/O In: -  Out: 800 [Urine:800]  Physical Exam:  Constitutional: Vital signs reviewed. WD WN in NAD   Eyes: PERRL, No scleral icterus.   Cardiovascular: RRR Pulmonary/Chest: Normal effort Abdomen: Minimal left lower quadrant tenderness, also mild CVA tenderness noted.  Lab Results:  Recent Labs  11/09/13 1600  HGB 12.3*  HCT 37.2*   BMET  Recent Labs  11/09/13 1600  NA 132*  K 3.8  CL 96  CO2 24  GLUCOSE 142*  BUN 21  CREATININE 1.55*  CALCIUM 9.0   No results found for this basename: LABPT, INR,  in the last 72 hours No results found for this basename: LABURIN,  in the last 72 hours No results found for this or any previous visit.  Studies/Results: Dg Abd 1 View  11/09/2013   CLINICAL DATA:  History of left-sided renal stones, post left-sided ureteral stent placement (10/30/2013) now with left-sided abdominal pain and fever for 3 days  EXAM: ABDOMEN - 1 VIEW  COMPARISON:  10/25/2013; 09/30/2013 CT abdomen pelvis -09/28/2013  FINDINGS: Interval placement of a left-sided ureteral stent with the superior coil overlying the expected location of the left renal pelvis and caudal aspect overlying the expected location of the urinary bladder.  No definite stones overlie the expected location of either renal fossa, the right-sided ureter, the left-sided ureteral stent or the urinary bladder.  Phleboliths overlies the caudal aspect of the left hemipelvis.  Paucity of bowel gas without definite evidence of obstruction. Nondiagnostic evaluation for pneumoperitoneum secondary to supine positioning and exclusion of the lower thorax. No definite pneumatosis or portal venous gas.  No acute osseus abnormalities.  IMPRESSION: Interval placement of left-sided ureteral stent. No definite evidence of nephrolithiasis. No definite opacities overlie the left-sided ureteral stent.   Electronically Signed   By: Simonne Come M.D.   On: 11/09/2013 16:06   US Renal  11/09/2013   CLINICAL DATA:  Fever, chills, and left flank pain. Left-sided renal stent placement on 10/30/2013. History of kidney stones, hypertension, and diabetes.  EXAM: RENAL/URINARY TRACT ULTRASOUND COMPLETE  COMPARISON:  CT of the abdomen and pelvis on 09/28/2013  FINDINGS: Right Kidney  Length: 11.8 cm. Echogenicity within normal limits. No mass or hydronephrosis visualized.  Left Kidney  Length: 11.3 cm. Echogenicity within normal limits. No mass or hydronephrosis visualized.  Bladder  The bladder has a normal appearance. Coiled ureteral stent is visualized.  IMPRESSION: 1. Normal appearance of the kidneys bilaterally. 2. Ureteral stent visualized within the bladder.   Electronically Signed   By: Rosalie Gums M.D.   On: 11/09/2013 19:20    Assessment/Plan:   UTI, following endoscopic management of a left ureteral stone 11 days ago. Pain is better, he still has chills.    We will continue him on IV antibiotics, tight glucose control, as he is diabetic.    If he has not improve significantly by Monday morning, would  consider contrasted CT scan to assess for proper stent drainage   LOS: 1 day   Marcine Matar M 11/10/2013, 8:39 AM

## 2013-11-10 NOTE — Progress Notes (Signed)
Pt assisted to bathroom by nurse tech.  Tech was in the room and checked on patient in bathroom.  When pt stood up he began loosing his balance and complained of dizziness.  Pt sat on commode until stable then assisted back to bed by tech.  Orthostatics taken and were positive.  Pt instructed to call for assistance before getting up, BSC placed next to bed.

## 2013-11-10 NOTE — Progress Notes (Signed)
Spoke with Dr. Retta Diones regarding the dizziness episode.  Orders received, will continue to monitor.

## 2013-11-11 ENCOUNTER — Inpatient Hospital Stay (HOSPITAL_COMMUNITY): Payer: BC Managed Care – PPO

## 2013-11-11 ENCOUNTER — Encounter (HOSPITAL_COMMUNITY): Payer: Self-pay | Admitting: Radiology

## 2013-11-11 DIAGNOSIS — R6510 Systemic inflammatory response syndrome (SIRS) of non-infectious origin without acute organ dysfunction: Secondary | ICD-10-CM | POA: Diagnosis not present

## 2013-11-11 DIAGNOSIS — N390 Urinary tract infection, site not specified: Secondary | ICD-10-CM | POA: Diagnosis present

## 2013-11-11 LAB — URINE CULTURE: Culture: >=100000

## 2013-11-11 LAB — CBC
HCT: 30.5 % — ABNORMAL LOW (ref 39.0–52.0)
MCH: 27.6 pg (ref 26.0–34.0)
MCHC: 33.1 g/dL (ref 30.0–36.0)
MCV: 83.3 fL (ref 78.0–100.0)
Platelets: 194 10*3/uL (ref 150–400)
RBC: 3.66 MIL/uL — ABNORMAL LOW (ref 4.22–5.81)
RDW: 14.5 % (ref 11.5–15.5)

## 2013-11-11 LAB — BASIC METABOLIC PANEL
BUN: 14 mg/dL (ref 6–23)
Calcium: 8.1 mg/dL — ABNORMAL LOW (ref 8.4–10.5)
Chloride: 98 mEq/L (ref 96–112)
Creatinine, Ser: 1.29 mg/dL (ref 0.50–1.35)
GFR calc Af Amer: 70 mL/min — ABNORMAL LOW (ref >90)
GFR calc non Af Amer: 60 mL/min — ABNORMAL LOW (ref >90)
Glucose, Bld: 142 mg/dL — ABNORMAL HIGH (ref 70–99)
Potassium: 3.5 mEq/L (ref 3.5–5.1)

## 2013-11-11 LAB — GLUCOSE, CAPILLARY: Glucose-Capillary: 123 mg/dL — ABNORMAL HIGH (ref 70–99)

## 2013-11-11 MED ORDER — ACETAMINOPHEN 500 MG PO TABS
500.0000 mg | ORAL_TABLET | ORAL | Status: DC | PRN
  Administered 2013-11-13: 500 mg via ORAL
  Filled 2013-11-11: qty 1

## 2013-11-11 MED ORDER — OXYCODONE HCL 5 MG PO TABS: 5.0000 mg | ORAL_TABLET | ORAL | Status: DC | PRN

## 2013-11-11 MED ORDER — IOHEXOL 300 MG/ML  SOLN
25.0000 mL | INTRAMUSCULAR | Status: AC
  Administered 2013-11-11: 25 mL via ORAL

## 2013-11-11 MED ORDER — IOHEXOL 300 MG/ML  SOLN
100.0000 mL | Freq: Once | INTRAMUSCULAR | Status: AC | PRN
  Administered 2013-11-11: 100 mL via INTRAVENOUS

## 2013-11-11 MED ORDER — DEXTROSE 5 % IV SOLN
1.0000 g | INTRAVENOUS | Status: DC
  Administered 2013-11-11: 1 g via INTRAVENOUS
  Filled 2013-11-11: qty 10

## 2013-11-11 MED ORDER — METFORMIN HCL ER 500 MG PO TB24
1000.0000 mg | ORAL_TABLET | Freq: Every day | ORAL | Status: DC
  Administered 2013-11-14: 1000 mg via ORAL
  Filled 2013-11-11 (×3): qty 2

## 2013-11-11 MED ORDER — VANCOMYCIN HCL IN DEXTROSE 1-5 GM/200ML-% IV SOLN
1000.0000 mg | Freq: Two times a day (BID) | INTRAVENOUS | Status: DC
  Administered 2013-11-11 – 2013-11-13 (×5): 1000 mg via INTRAVENOUS
  Filled 2013-11-11 (×5): qty 200

## 2013-11-11 NOTE — Progress Notes (Signed)
Urology Progress Note  Subjective:   Patient had left ureteroscopy 10/30/13 for al left ureter stone. Did well until about 10 days afterward when he began to have low grade fever and abdominal pain. Presented to the ER and was admitted to urology service. KUB shows his left ureter stent is in place and renal US showed no hydronephrosis.  Admitted 11/09/13 and started on antibiotics. Began spiking fever overnight to 102 w/ tachycardia. On call MD not notified. Tachycardia improved. Patient denies cough of SOB. Has some LLQ abdominal discomfort w/ voiding. Negative nausea or emesis. He feels he is emptying his bladder well.       ROS: Negative: chest pain or SOB.  Objective:  Patient Vitals for the past 24 hrs:  BP Temp Temp src Pulse Resp SpO2  11/11/13 0610 119/59 mmHg 102 F (38.9 C) Oral 103 20 100 %  11/11/13 0207 - 100.2 F (37.9 C) Oral - - -  11/10/13 2352 - 102.7 F (39.3 C) Oral - - -  11/10/13 2240 - 102 F (38.9 C) Oral - - -  11/10/13 2136 125/63 mmHg 101.6 F (38.7 C) Oral 102 16 100 %  11/10/13 1950 - 100.9 F (38.3 C) Oral - - -  11/10/13 1327 - 99.9 F (37.7 C) Oral - - -  11/10/13 1313 91/56 mmHg - - 138 - -  11/10/13 1311 109/76 mmHg - - 117 - -  11/10/13 1310 129/72 mmHg - - 105 - -  11/10/13 1011 - 98.9 F (37.2 C) Oral - - -    Physical Exam: General:  No acute distress, awake Cardiovascular:  Mild tachycardia. Regular rhythm.   Chest:  CTA-B Abdomen:               []  Soft, appropriately TTP  [x]  Soft, NTTP, ND  []  Soft, appropriately TTP, incision(s) clean/dry/intact  Genitourinary: Negative foley.     I/O last 3 completed shifts: In: 3986.7 [P.O.:1920; I.V.:2066.7] Out: 2945 [Urine:2945]  Recent Labs     11/10/13  0916  11/11/13  0455  HGB  11.9*  10.1*  WBC  17.3*  11.4*  PLT  207  194    Recent Labs     11/10/13  0916  11/11/13  0455  NA  131*  132*  K  3.6  3.5  CL  96  98  CO2  24  24  BUN  16  14  CREATININE  1.24   1.29  CALCIUM  8.7  8.1*  GFRNONAA  63*  60*  GFRAA  73*  70*     No results found for this basename: PT, INR, APTT,  in the last 72 hours   No components found with this basename: ABG,     Length of stay: 2 days.  Assessment: Febrile UTI. SIRS.     Plan: -Blood cultures x2 now. -Switch from cipro to IV rocephin & vancomycin per pharmacy dosing for Enterococcus in urine. Await final sensitivities. -Abdominal imaging w/ CT now; rule out abscess or obstruction. NPO until CT results final. -Chest x-ray to r/o pneumonia as a precaution. -Increase IV fluid rate to 125cc/hour. -Bladder scan to ensure he is emptying well; if not will place foley. -Add telemetry monitoring; if condition acutely worsens will transfer to stepdown.   Natalia Leatherwood, MD [phone]

## 2013-11-11 NOTE — Progress Notes (Signed)
GU progress.  I updated the patient w/ the results of his CXR and CT.  CXR- no evidence of infection. CT- no abscess. Stent in good position. No other source of infection noted.   He feels better. Urine culture shows sensitivity to vanc; will d/c rocephin. Tachycardia has resolved.

## 2013-11-11 NOTE — Progress Notes (Signed)
Pt bladder scanned after voiding.  Only 25-20cc noted residual. Dr. Margarita Grizzle notified.

## 2013-11-11 NOTE — Progress Notes (Signed)
ANTIBIOTIC CONSULT NOTE - INITIAL  Pharmacy Consult for Vancomycin Indication: Enterococcus UTI, r/o bacteremia  Allergies  Allergen Reactions  . Celebrex [Celecoxib] Other (See Comments)    GI UPSET  . Clindamycin Hcl Other (See Comments)    GI UPSET  . Codeine Other (See Comments)    GI UPSET  . Erythromycin Other (See Comments)    GI UPSET    Patient Measurements: Height: 5\' 10"  (177.8 cm) Weight: 186 lb 15.2 oz (84.8 kg) IBW/kg (Calculated) : 73  Vital Signs: Temp: 102 F (38.9 C) (11/17 0610) Temp src: Oral (11/17 0610) BP: 119/59 mmHg (11/17 0610) Pulse Rate: 103 (11/17 0610) Intake/Output from previous day: 11/16 0701 - 11/17 0700 In: 3226.7 [P.O.:960; I.V.:2266.7] Out: 1995 [Urine:1995] Intake/Output from this shift:    Labs:  Recent Labs  11/09/13 1600 11/10/13 0916 11/11/13 0455  WBC 21.2* 17.3* 11.4*  HGB 12.3* 11.9* 10.1*  PLT 219 207 194  CREATININE 1.55* 1.24 1.29   Estimated Creatinine Clearance: 65.2 ml/min (by C-G formula based on Cr of 1.29). 62 ml/min/1.9m2 (normalized)  Microbiology: Recent Results (from the past 720 hour(s))  URINE CULTURE     Status: None   Collection Time    11/09/13  3:54 PM      Result Value Range Status   Specimen Description URINE, CLEAN CATCH   Final   Special Requests NONE   Final   Culture  Setup Time     Final   Value: 11/09/2013 20:28     Performed at [facility]   Culture     Final   Value: >100 COLONIES ENTEROCOCCUS SPECIES     Performed at [facility]   Report Status PENDING   Incomplete    Medical History: Past Medical History  Diagnosis Date  . Hyperlipidemia   . Hypertension   . Essential tremor     BENIGN--   . OA (osteoarthritis)   . Diverticulosis   . Diastolic dysfunction, left ventricle     GRADE 2  . Left ureteral calculus   . Urgency of urination     Medications:  Scheduled:  . atorvastatin  10 mg Oral Daily  . benazepril  2.5 mg Oral QPM  .  cefTRIAXone (ROCEPHIN)  IV  1 g Intravenous Q24H  . docusate sodium  100 mg Oral BID  . DULoxetine  60 mg Oral Daily  . heparin  5,000 Units Subcutaneous Q8H  . insulin aspart  0-9 Units Subcutaneous TID WC  . metFORMIN  1,000 mg Oral Q breakfast  . polyethylene glycol  17 g Oral QODAY  . tamsulosin  0.4 mg Oral Daily   Infusions:  . sodium chloride 100 mL/hr at 11/11/13 0206    Anti-infectives: Rocephin 11/15, resume 11/17 11/15 >> Cipro >> 11/17 11/17 >> Vanc >>  Assessment: 57 yo male s/p endoscopic management of a left ureteral stone on 11/5. Having continued fever and chills on cipro, urine now growing enterococcus in urine culture. Since cipro has no intrinsic activity against enterococcus, adjusting antibiotics to vancomycin and resuming rocephin for gram negative coverage.   Goal of Therapy:  Vancomycin trough level 15-20 mcg/ml, can target 10-15 mcg/ml if bacteremia is ruled out  Plan:   Vancomycin 1g IV q12h Check trough at steady state Continue Rocephin per MD Follow up renal function & cultures  Loralee Pacas, PharmD, BCPS Pager: [phone] 11/11/2013,7:34 AM

## 2013-11-12 LAB — BASIC METABOLIC PANEL
BUN: 12 mg/dL (ref 6–23)
CO2: 25 mEq/L (ref 19–32)
Chloride: 100 mEq/L (ref 96–112)
Creatinine, Ser: 1.12 mg/dL (ref 0.50–1.35)
GFR calc non Af Amer: 71 mL/min — ABNORMAL LOW (ref >90)
Glucose, Bld: 146 mg/dL — ABNORMAL HIGH (ref 70–99)

## 2013-11-12 LAB — CBC
HCT: 28.9 % — ABNORMAL LOW (ref 39.0–52.0)
Hemoglobin: 9.6 g/dL — ABNORMAL LOW (ref 13.0–17.0)
MCV: 82.1 fL (ref 78.0–100.0)
RBC: 3.52 MIL/uL — ABNORMAL LOW (ref 4.22–5.81)
WBC: 10 10*3/uL (ref 4.0–10.5)

## 2013-11-12 LAB — GLUCOSE, CAPILLARY: Glucose-Capillary: 133 mg/dL — ABNORMAL HIGH (ref 70–99)

## 2013-11-12 NOTE — Progress Notes (Signed)
Urology Progress Note  Subjective:   No acute events overnight. Afebrile since yesterday morning. Tachycardia resolved. Negative abdominal pain. No further lightheadedness. Voiding well.   CT and CXR yesterday negative for concerning findings.       ROS: Negative: chest pain or SOB.  Objective:  Patient Vitals for the past 24 hrs:  BP Temp Temp src Pulse Resp SpO2  11/12/13 0537 118/68 mmHg 98.6 F (37 C) Oral 77 18 100 %  11/11/13 2209 124/73 mmHg 99.1 F (37.3 C) Oral 92 18 100 %  11/11/13 1438 121/70 mmHg 99 F (37.2 C) Oral 93 18 100 %  11/11/13 0725 - 99.4 F (37.4 C) Oral - - -  11/11/13 0610 119/59 mmHg 102 F (38.9 C) Oral 103 20 100 %    Physical Exam: General:  No acute distress, awake Cardiovascular:  RRR. No rubs.   Chest:  CTA-B Abdomen:               []  Soft, appropriately TTP  [x]  Soft, NTTP, ND  []  Soft, appropriately TTP, incision(s) clean/dry/intact  Genitourinary: Negative foley.     I/O last 3 completed shifts: In: 4697.5 [P.O.:1200; I.V.:3247.5; IV Piggyback:250] Out: 2395 [Urine:2395]  Recent Labs     11/11/13  0455  11/12/13  0417  HGB  10.1*  9.6*  WBC  11.4*  10.0  PLT  194  199    Recent Labs     11/11/13  0455  11/12/13  0417  NA  132*  134*  K  3.5  3.5  CL  98  100  CO2  24  25  BUN  14  12  CREATININE  1.29  1.12  CALCIUM  8.1*  8.3*  GFRNONAA  60*  71*  GFRAA  70*  82*     No results found for this basename: PT, INR, APTT,  in the last 72 hours   No components found with this basename: ABG,     Length of stay: 3 days.  Assessment:  Febrile Enterococcus UTI.  SIRS. (Resolved)     Plan: -Continue vancomycin per pharmacy dosing. -Await further culture results from blood. -Will obtain input from Infectious Disease regarding length of antibiotic treatment. -Decrease IV fluid rate as he is no longer having fever. -Continue heparin. -Likely at least 2 more nights in the hospital.   Natalia Leatherwood,  MD [phone]

## 2013-11-13 LAB — GLUCOSE, CAPILLARY
Glucose-Capillary: 115 mg/dL — ABNORMAL HIGH (ref 70–99)
Glucose-Capillary: 113 mg/dL — ABNORMAL HIGH (ref 70–99)
Glucose-Capillary: 128 mg/dL — ABNORMAL HIGH (ref 70–99)
Glucose-Capillary: 111 mg/dL — ABNORMAL HIGH (ref 70–99)

## 2013-11-13 LAB — CBC
HCT: 29.6 % — ABNORMAL LOW (ref 39.0–52.0)
Hemoglobin: 9.8 g/dL — ABNORMAL LOW (ref 13.0–17.0)
MCV: 82.5 fL (ref 78.0–100.0)
Platelets: 215 10*3/uL (ref 150–400)
RBC: 3.59 MIL/uL — ABNORMAL LOW (ref 4.22–5.81)
WBC: 10 10*3/uL (ref 4.0–10.5)

## 2013-11-13 LAB — BASIC METABOLIC PANEL
BUN: 11 mg/dL (ref 6–23)
CO2: 25 mEq/L (ref 19–32)
Calcium: 8.5 mg/dL (ref 8.4–10.5)
Chloride: 102 mEq/L (ref 96–112)
Creatinine, Ser: 1.05 mg/dL (ref 0.50–1.35)
Glucose, Bld: 136 mg/dL — ABNORMAL HIGH (ref 70–99)
Potassium: 3.4 mEq/L — ABNORMAL LOW (ref 3.5–5.1)

## 2013-11-13 MED ORDER — AMOXICILLIN-POT CLAVULANATE 500-125 MG PO TABS
1.0000 | ORAL_TABLET | Freq: Three times a day (TID) | ORAL | Status: DC
  Administered 2013-11-13 – 2013-11-14 (×4): 500 mg via ORAL
  Filled 2013-11-13 (×6): qty 1

## 2013-11-13 MED ORDER — POTASSIUM CHLORIDE CRYS ER 20 MEQ PO TBCR
20.0000 meq | EXTENDED_RELEASE_TABLET | Freq: Two times a day (BID) | ORAL | Status: AC
  Administered 2013-11-13 (×2): 20 meq via ORAL
  Filled 2013-11-13 (×2): qty 1

## 2013-11-13 NOTE — Progress Notes (Signed)
Urology Progress Note  Subjective:   No acute events overnight. Afebrile for >48 hours. No abdominal pain. Has some mild stent discomfort. Voiding without difficulty.  I explained the ID recommendations and plan with the patient.       ROS: Negative: chest pain or SOB.  Objective:  Patient Vitals for the past 24 hrs:  BP Temp Temp src Pulse Resp SpO2  11/13/13 0542 123/72 mmHg 97.8 F (36.6 C) Oral 69 18 100 %  11/12/13 2249 122/69 mmHg 98.5 F (36.9 C) Oral 65 18 98 %  11/12/13 1428 136/79 mmHg 98.3 F (36.8 C) Oral 95 18 100 %    Physical Exam: General:  No acute distress, awake Cardiovascular:  RRR. No rubs.   Chest:  CTA-B Abdomen:               []  Soft, appropriately TTP  [x]  Soft, NTTP, ND  []  Soft, appropriately TTP, incision(s) clean/dry/intact  Genitourinary: Negative foley.     I/O last 3 completed shifts: In: 4996.7 [P.O.:600; I.V.:3796.7; IV Piggyback:600] Out: 1820 [Urine:1820]  Recent Labs     11/12/13  0417  11/13/13  0502  HGB  9.6*  9.8*  WBC  10.0  10.0  PLT  199  215    Recent Labs     11/12/13  0417  11/13/13  0502  NA  134*  137  K  3.5  3.4*  CL  100  102  CO2  25  25  BUN  12  11  CREATININE  1.12  1.05  CALCIUM  8.3*  8.5  GFRNONAA  71*  77*  GFRAA  82*  89*     No results found for this basename: PT, INR, APTT,  in the last 72 hours   No components found with this basename: ABG,     Length of stay: 4 days.  Assessment:  Febrile Enterococcus UTI.  SIRS. (Resolved)     Plan: -Spoke w/ Dr. Ninetta Lights of ID who recommended PO augmentin for a total of 10 days of treatment. -Blood cultures negative to date; if remain negative tomorrow, will likely d/c home. -Saline-lock IV. -Replace potassium PO. -D/c daily labs. -D/c IV vanc after current dose is complete. -D/c telemetry.   Natalia Leatherwood, MD [phone]

## 2013-11-14 LAB — GLUCOSE, CAPILLARY: Glucose-Capillary: 99 mg/dL (ref 70–99)

## 2013-11-14 MED ORDER — AMOXICILLIN-POT CLAVULANATE 500-125 MG PO TABS: 1.0000 | ORAL_TABLET | Freq: Three times a day (TID) | ORAL | Status: DC

## 2013-11-14 NOTE — Progress Notes (Signed)
Patient discharge to home, ambulatory. D/c instructions and follow up appointments done and was given to the patient.  PIV removed no s/s of infection , no infiltration noted upon dischraged.

## 2013-11-14 NOTE — Discharge Summary (Signed)
Physician Discharge Summary  Patient ID: Isaiah Dunn MRN: [id number] DOB/AGE: 57-Jul-1957 57 y.o.  Admit date: 11/09/2013 Discharge date: 11/14/2013  Admission Diagnoses: Fever. UTI.  Discharge Diagnoses:  Principal Problem:   SIRS (systemic inflammatory response syndrome) Active Problems:   UTI (urinary tract infection)   Discharged Condition: good  Hospital Course:  This patient was admitted to the hospital 10 days after a left ureteroscopy for an obstructing stone.  He was noted to have several areas of narrowing in the ureter which were able to be accessed with placement of a ureter sheath.  Because of this a ureter stent was left in place and it was planned to be left in place for approximately one month before removal.  Patient began having low-grade fever at home to 100 and abdominal pain.  He presented to the ER on November 15 and was admitted and it appeared he could have urinary tract infection and leukocytosis.  He was started on a broad-spectrum antibiotic with Rocephin and then switched to ciprofloxacin. KUB showed the stent in proper position and renal US showed not hydronephrosis. He began having high fevers to 102 with tachycardia and late on 11/10/13-early 11/11/13.  He was placed on telemetry monitoring, IV fluid rate was increased, and antibiotics were changed based on the fact that his urine cultures were showing enterococcus.  He was started on vancomycin.  Blood cultures were also drawn. Chest x-ray showed no pneumonia. CT abdomen/pelvis showed no abscess or renal obstruction. He responded quickly to the changes in management, his tachycardia resolved, and sensitivity showed susceptibility to vancomycin as well as ampicillin and Levaquin.  Tachycardia resolved and IV fluid rates were decreased.  Blood cultures were negative for growth to date.  I contacted the infectious disease physician, Dr. Ninetta Lights, who recommended 10 total days of antibiotic treatment.  The patient  continued to recover well and had no further fever or tachycardia.  He was able to be transitioned to Augmentin per infectious disease recommendations and remain afebrile for 24 hours on oral therapy.  It was felt that he could be discharged home.    Consults: None  Significant Diagnostic Studies: microbiology: blood culture: negative and urine culture: positive for enterococcus and radiology: CXR: normal and CT scan: Negative abscess or renal obstruction.  Treatments: IV hydration, antibiotics: vancomycin, Cipro, ceftriaxone and augmentin and insulin: regular  Discharge Exam: Blood pressure 120/77, pulse 78, temperature 98.4 F (36.9 C), temperature source Oral, resp. rate 18, height 5\' 10"  (1.778 m), weight 84.8 kg (186 lb 15.2 oz), SpO2 100.00%. Refer to PE from progress note on date of discharge.  Disposition: 01-Home or Self Care  Discharge Orders   Future Appointments Provider Department Dept Phone   11/25/2013 4:00 PM Stacie Glaze, MD [facility] [phone]   Future Orders Complete By Expires   Discharge patient  As directed        Medication List    STOP taking these medications       acetaminophen 500 MG tablet  Commonly known as:  TYLENOL      TAKE these medications       amoxicillin-clavulanate 500-125 MG per tablet  Commonly known as:  AUGMENTIN  Take 1 tablet (500 mg total) by mouth 3 (three) times daily. Take 3 times daily for 7 days. Then start taking 3 times daily three days before your stent removal.     aspirin 81 MG chewable tablet  Chew 81 mg by mouth daily.  atorvastatin 10 MG tablet  Commonly known as:  LIPITOR  Take 10 mg by mouth daily.     benazepril 5 MG tablet  Commonly known as:  LOTENSIN  Take 2.5 mg by mouth every evening.     DULoxetine 60 MG capsule  Commonly known as:  CYMBALTA  Take 60 mg by mouth daily.     HYDROmorphone 4 MG tablet  Commonly known as:  DILAUDID  Take 1 tablet (4 mg total) by mouth  every 4 (four) hours as needed for moderate pain.     hyoscyamine 0.125 MG tablet  Commonly known as:  LEVSIN, ANASPAZ  Take 1 tablet (0.125 mg total) by mouth every 4 (four) hours as needed (bladder spasms).     metFORMIN 500 MG 24 hr tablet  Commonly known as:  GLUCOPHAGE-XR  Take 1,000 mg by mouth daily with breakfast.     multivitamin with minerals Tabs tablet  Take 1 tablet by mouth once a week.     oxybutynin 5 MG tablet  Commonly known as:  DITROPAN  Take 1 tablet (5 mg total) by mouth every 6 (six) hours as needed for bladder spasms.     phenazopyridine 100 MG tablet  Commonly known as:  PYRIDIUM  Take 1 tablet (100 mg total) by mouth every 8 (eight) hours as needed for pain (Burning urination.  Will turn urine and body fluids orange.).     polyethylene glycol powder powder  Commonly known as:  GLYCOLAX/MIRALAX  Take 17 g by mouth every other day.     tamsulosin 0.4 MG Caps capsule  Commonly known as:  FLOMAX  Take 1 capsule (0.4 mg total) by mouth daily.           Follow-up Information   Follow up with Milford Cage, MD. (Keep future follow up appointment.)    Specialty:  Urology   Contact information:   [address] [place] FLOOR [address], [place] [address] [phone]       Signed: Milford Cage 11/14/2013, 7:31 AM

## 2013-11-14 NOTE — Progress Notes (Signed)
Urology Progress Note  Subjective:   No acute events overnight. Afebrile. No abdominal pain.       ROS: Negative: chest pain or SOB.  Objective:  Patient Vitals for the past 24 hrs:  BP Temp Temp src Pulse Resp SpO2  11/14/13 0529 120/77 mmHg 98.4 F (36.9 C) Oral 78 - 100 %  11/13/13 2218 125/78 mmHg 98.7 F (37.1 C) Oral 74 18 100 %  11/13/13 1452 123/79 mmHg 98.1 F (36.7 C) Oral 83 18 100 %  11/13/13 1151 120/76 mmHg - - 86 - -    Physical Exam: General:  No acute distress, awake Cardiovascular:  RRR. No rubs.   Chest:  CTA-B Abdomen:               []  Soft, appropriately TTP  [x]  Soft, NTTP, ND  []  Soft, appropriately TTP, incision(s) clean/dry/intact  Genitourinary: Negative foley.     I/O last 3 completed shifts: In: 1595 [P.O.:420; I.V.:975; IV Piggyback:200] Out: -   Recent Labs     11/12/13  0417  11/13/13  0502  HGB  9.6*  9.8*  WBC  10.0  10.0  PLT  199  215    Recent Labs     11/12/13  0417  11/13/13  0502  NA  134*  137  K  3.5  3.4*  CL  100  102  CO2  25  25  BUN  12  11  CREATININE  1.12  1.05  CALCIUM  8.3*  8.5  GFRNONAA  71*  77*  GFRAA  82*  89*     No results found for this basename: PT, INR, APTT,  in the last 72 hours   No components found with this basename: ABG,     Length of stay: 5 days.  Assessment:  Febrile Enterococcus UTI.  SIRS. (Resolved)     Plan: -D/c home w/ 7 days of augmentin. Will give additional 7 days to begin 3 days before stent removal.    Natalia Leatherwood, MD [phone]

## 2013-11-17 LAB — CULTURE, BLOOD (ROUTINE X 2): Culture: NO GROWTH

## 2013-11-25 ENCOUNTER — Encounter: Payer: Self-pay | Admitting: Internal Medicine

## 2013-11-25 ENCOUNTER — Ambulatory Visit (INDEPENDENT_AMBULATORY_CARE_PROVIDER_SITE_OTHER): Payer: BC Managed Care – PPO | Admitting: Internal Medicine

## 2013-11-25 VITALS — BP 108/68 | HR 74 | Temp 98.0°F | Wt 186.0 lb

## 2013-11-25 DIAGNOSIS — IMO0001 Reserved for inherently not codable concepts without codable children: Secondary | ICD-10-CM

## 2013-11-25 DIAGNOSIS — T887XXA Unspecified adverse effect of drug or medicament, initial encounter: Secondary | ICD-10-CM

## 2013-11-25 MED ORDER — AMOXICILLIN-POT CLAVULANATE 500-125 MG PO TABS: 1.0000 | ORAL_TABLET | Freq: Three times a day (TID) | ORAL | Status: DC

## 2013-11-25 NOTE — Progress Notes (Signed)
Pre visit review using our clinic review tool, if applicable. No additional management support is needed unless otherwise documented below in the visit note. 

## 2013-11-25 NOTE — Patient Instructions (Signed)
The patient is instructed to continue all medications as prescribed. Schedule followup with check out clerk upon leaving the clinic  

## 2013-11-25 NOTE — Progress Notes (Signed)
   Subjective:    Patient ID: Isaiah Dunn, male    DOB: 08-Nov-1956, 57 y.o.   MRN: [id number]  HPI 57 year old diabetic who has had a complicated course of a renal stone requiring surgical removal of renal stone and stent placement.  He had a marked perioperative course with significant infection.  He has lost significant weight due to his illness which could impact his hemoglobin A1c in may allow [place] to adjust some of his medications but today we will obtain hemoglobin A1c a liver and LDL C. To assess his diabetic contro.  The stent will be removed at this Friday   Review of Systems  Constitutional: Negative for fever and fatigue.  HENT: Negative for congestion, hearing loss and postnasal drip.   Eyes: Negative for discharge, redness and visual disturbance.  Respiratory: Negative for cough, shortness of breath and wheezing.   Cardiovascular: Negative for leg swelling.  Gastrointestinal: Negative for abdominal pain, constipation and abdominal distention.  Genitourinary: Negative for urgency and frequency.  Musculoskeletal: Negative for arthralgias, joint swelling and neck pain.  Skin: Negative for color change and rash.  Neurological: Negative for weakness and light-headedness.  Hematological: Negative for adenopathy.  Psychiatric/Behavioral: Negative for behavioral problems.       Objective:   Physical Exam  Constitutional: He appears well-developed and well-nourished.  HENT:  Head: Normocephalic and atraumatic.  Eyes: Conjunctivae are normal. Pupils are equal, round, and reactive to light.  Neck: Normal range of motion. Neck supple.  Cardiovascular: Normal rate and regular rhythm.   Pulmonary/Chest: Effort normal and breath sounds normal.  Abdominal: Soft. Bowel sounds are normal.  Musculoskeletal: Normal range of motion. He exhibits edema.  Skin: Skin is warm and dry.  Psychiatric: He has a normal mood and affect. His behavior is normal.          Assessment &  Plan:  Monitoring for dm a1c and liver on metformin

## 2013-11-26 LAB — HEPATIC FUNCTION PANEL
ALT: 30 U/L (ref 0–53)
AST: 19 U/L (ref 0–37)
Albumin: 3.4 g/dL — ABNORMAL LOW (ref 3.5–5.2)
Alkaline Phosphatase: 85 U/L (ref 39–117)
Total Protein: 7.1 g/dL (ref 6.0–8.3)

## 2013-11-26 LAB — LDL CHOLESTEROL, DIRECT: Direct LDL: 83.8 mg/dL

## 2014-01-22 ENCOUNTER — Other Ambulatory Visit: Payer: Self-pay | Admitting: Internal Medicine

## 2014-01-27 ENCOUNTER — Other Ambulatory Visit: Payer: Self-pay | Admitting: Internal Medicine

## 2014-02-19 ENCOUNTER — Other Ambulatory Visit: Payer: Self-pay | Admitting: Internal Medicine

## 2014-02-24 ENCOUNTER — Other Ambulatory Visit: Payer: Self-pay | Admitting: Internal Medicine

## 2014-03-07 ENCOUNTER — Ambulatory Visit (INDEPENDENT_AMBULATORY_CARE_PROVIDER_SITE_OTHER): Payer: BC Managed Care – PPO | Admitting: Internal Medicine

## 2014-03-07 ENCOUNTER — Encounter: Payer: Self-pay | Admitting: Internal Medicine

## 2014-03-07 VITALS — BP 118/80 | HR 76 | Temp 97.8°F | Ht 69.0 in | Wt 198.0 lb

## 2014-03-07 DIAGNOSIS — T887XXA Unspecified adverse effect of drug or medicament, initial encounter: Secondary | ICD-10-CM

## 2014-03-07 DIAGNOSIS — IMO0001 Reserved for inherently not codable concepts without codable children: Secondary | ICD-10-CM

## 2014-03-07 DIAGNOSIS — E1165 Type 2 diabetes mellitus with hyperglycemia: Principal | ICD-10-CM

## 2014-03-07 DIAGNOSIS — E785 Hyperlipidemia, unspecified: Secondary | ICD-10-CM

## 2014-03-07 LAB — CBC WITH DIFFERENTIAL/PLATELET
Basophils Absolute: 0 10*3/uL (ref 0.0–0.1)
Basophils Relative: 0 % (ref 0–1)
Eosinophils Absolute: 0.1 10*3/uL (ref 0.0–0.7)
Eosinophils Relative: 2 % (ref 0–5)
HEMATOCRIT: 33.7 % — AB (ref 39.0–52.0)
HEMOGLOBIN: 11.1 g/dL — AB (ref 13.0–17.0)
LYMPHS PCT: 39 % (ref 12–46)
Lymphs Abs: 2.7 10*3/uL (ref 0.7–4.0)
MCH: 26.4 pg (ref 26.0–34.0)
MCHC: 32.9 g/dL (ref 30.0–36.0)
MCV: 80 fL (ref 78.0–100.0)
MONO ABS: 0.7 10*3/uL (ref 0.1–1.0)
Monocytes Relative: 10 % (ref 3–12)
NEUTROS ABS: 3.3 10*3/uL (ref 1.7–7.7)
Neutrophils Relative %: 49 % (ref 43–77)
Platelets: 298 10*3/uL (ref 150–400)
RBC: 4.21 MIL/uL — ABNORMAL LOW (ref 4.22–5.81)
RDW: 14.8 % (ref 11.5–15.5)
WBC: 6.8 10*3/uL (ref 4.0–10.5)

## 2014-03-07 LAB — HEMOGLOBIN A1C
Hgb A1c MFr Bld: 5.9 % — ABNORMAL HIGH (ref <5.7)
MEAN PLASMA GLUCOSE: 123 mg/dL — AB (ref <117)

## 2014-03-07 NOTE — Progress Notes (Signed)
Subjective:    Patient ID: Isaiah Dunn, male    DOB: 31-Oct-1956, 58 y.o.   MRN: [id number]  HPI Blood pressure stable CBG's good Mild fatigue Wife with cancer and this has been stressfull    Review of Systems  Constitutional: Negative for fever and fatigue.  HENT: Negative for congestion, hearing loss and postnasal drip.   Eyes: Negative for discharge, redness and visual disturbance.  Respiratory: Negative for cough, shortness of breath and wheezing.   Cardiovascular: Negative for leg swelling.  Gastrointestinal: Negative for abdominal pain, constipation and abdominal distention.  Genitourinary: Negative for urgency and frequency.  Musculoskeletal: Negative for arthralgias, joint swelling and neck pain.  Skin: Negative for color change and rash.  Neurological: Negative for weakness and light-headedness.  Hematological: Negative for adenopathy.  Psychiatric/Behavioral: Negative for behavioral problems.       Past Medical History  Diagnosis Date  . Hyperlipidemia   . Hypertension   . Essential tremor     BENIGN--   . OA (osteoarthritis)   . Diverticulosis   . Diastolic dysfunction, left ventricle     GRADE 2  . Left ureteral calculus   . Urgency of urination   . Diabetes mellitus without complication     History   Social History  . Marital Status: Married    Spouse Name: N/A    Number of Children: N/A  . Years of Education: N/A   Occupational History  . Not on file.   Social History Main Topics  . Smoking status: Former Smoker -- 1.00 packs/day for 20 years    Types: Cigarettes    Quit date: 12/26/2000  . Smokeless tobacco: Never Used  . Alcohol Use: 1.0 oz/week    2 drink(s) per week  . Drug Use: No  . Sexual Activity: Not on file   Other Topics Concern  . Not on file   Social History Narrative  . No narrative on file    Past Surgical History  Procedure Laterality Date  . Vasectomy reversal  1993  . Knee arthroscopy w/ debridement Right  X3  LAST ONE 12-31-2008  . Anterior cervical decomp/discectomy fusion  04-24-2008    C5  -- C6  . Shoulder arthroscopy Right X2  LAST ONE 04-25-2006  . Shoulder arthroscopy with rotator cuff repair and subacromial decompression Left 05-20-2010    AND DEBRIDEMENT BICEP TENDON/ OPEN DISTAL CLAVICAL RESECTON  . Tonsillectomy  AGE 59  . Cardiac catheterization  06-21-2010  DR [facility]    RCA 25%/ MINIMAL DISEASE LM, CX  . Cardiovascular stress test  06-10-2010    NORMAL NUCLEAR STUDY BUT  CLINICALL ABNORMAL (CHEST PAIN, ST ABNORMALITY, HYPOTENSION)/   EF 61%  . Transthoracic echocardiogram  07-21-2010    GRADE II DIASTOLIC DYSFUNCTION/  EF 55%/  MILD DILATED AORTIC ROOT  . Cystoscopy with retrograde pyelogram, ureteroscopy and stent placement Left 10/30/2013    Procedure: CYSTOSCOPY WITH RETROGRADE PYELOGRAM, URETEROSCOPY AND STENT PLACEMENT;  Surgeon: Molli Hazard, MD;  Location: [facility];  Service: Urology;  Laterality: Left;  . Holmium laser application Left 88/03/1659    Procedure: HOLMIUM LASER APPLICATION;  Surgeon: Molli Hazard, MD;  Location: [facility];  Service: Urology;  Laterality: Left;    Family History  Problem Relation Age of Onset  . Arthritis Mother   . Hypertension Mother   . Hyperlipidemia Mother   . Hypertension Father   . Hyperlipidemia Father     Allergies  Allergen  Reactions  . Celebrex [Celecoxib] Other (See Comments)    GI UPSET  . Clindamycin Hcl Other (See Comments)    GI UPSET  . Codeine Other (See Comments)    GI UPSET  . Erythromycin Other (See Comments)    GI UPSET    Current Outpatient Prescriptions on File Prior to Visit  Medication Sig Dispense Refill  . aspirin 81 MG chewable tablet Chew 81 mg by mouth daily.      Marland Kitchen atorvastatin (LIPITOR) 10 MG tablet TAKE 1 TABLET BY MOUTH EVERY DAY  30 tablet  9  . benazepril (LOTENSIN) 5 MG tablet Take 2.5 mg by mouth every evening.       . diazepam  (VALIUM) 5 MG tablet TAKE 1/2 TABLET EVERY 8 HOURS AS NEEDED  45 tablet  3  . DULoxetine (CYMBALTA) 60 MG capsule Take 60 mg by mouth daily.      . DULoxetine (CYMBALTA) 60 MG capsule TAKE ONE CAPSULE BY MOUTH EVERY DAY  30 capsule  6  . metFORMIN (GLUCOPHAGE-XR) 500 MG 24 hr tablet Take 1,000 mg by mouth daily with breakfast.      . metFORMIN (GLUCOPHAGE-XR) 500 MG 24 hr tablet TAKE 2 TABLETS BY MOUTH EVERY MORNING WITH BREAKFAST  60 tablet  10  . Multiple Vitamin (MULTIVITAMIN WITH MINERALS) TABS tablet Take 1 tablet by mouth once a week.        No current facility-administered medications on file prior to visit.    BP 118/80  Pulse 76  Temp(Src) 97.8 F (36.6 C) (Oral)  Ht 5\' 9"  (1.753 m)  Wt 198 lb (89.812 kg)  BMI 29.23 kg/m2  SpO2 98%    Objective:   Physical Exam  Nursing note and vitals reviewed. Constitutional: He appears well-developed and well-nourished.  HENT:  Head: Normocephalic and atraumatic.  Eyes: Conjunctivae are normal. Pupils are equal, round, and reactive to light.  Neck: Normal range of motion. Neck supple.  Cardiovascular: Normal rate and regular rhythm.   Pulmonary/Chest: Effort normal and breath sounds normal.  Abdominal: Soft. Bowel sounds are normal.          Assessment & Plan:  Check a1c Cmet LDL direct  Stable DM Stable tremor  Stress management

## 2014-03-07 NOTE — Progress Notes (Signed)
Pre visit review using our clinic review tool, if applicable. No additional management support is needed unless otherwise documented below in the visit note. 

## 2014-03-07 NOTE — Patient Instructions (Signed)
The patient is instructed to continue all medications as prescribed. Schedule followup with check out clerk upon leaving the clinic  

## 2014-03-08 LAB — LDL CHOLESTEROL, DIRECT: LDL DIRECT: 74 mg/dL

## 2014-03-08 LAB — BASIC METABOLIC PANEL
BUN: 20 mg/dL (ref 6–23)
CO2: 29 meq/L (ref 19–32)
Calcium: 9.1 mg/dL (ref 8.4–10.5)
Chloride: 102 mEq/L (ref 96–112)
Creat: 1 mg/dL (ref 0.50–1.35)
GLUCOSE: 84 mg/dL (ref 70–99)
POTASSIUM: 3.9 meq/L (ref 3.5–5.3)
SODIUM: 138 meq/L (ref 135–145)

## 2014-05-29 ENCOUNTER — Other Ambulatory Visit: Payer: Self-pay | Admitting: Internal Medicine

## 2014-07-02 ENCOUNTER — Encounter: Payer: Self-pay | Admitting: Internal Medicine

## 2014-08-18 ENCOUNTER — Other Ambulatory Visit: Payer: Self-pay | Admitting: Internal Medicine

## 2014-09-19 ENCOUNTER — Other Ambulatory Visit (INDEPENDENT_AMBULATORY_CARE_PROVIDER_SITE_OTHER): Payer: BC Managed Care – PPO

## 2014-09-19 DIAGNOSIS — Z0000 Encounter for general adult medical examination without abnormal findings: Secondary | ICD-10-CM

## 2014-09-19 LAB — LIPID PANEL
Cholesterol: 139 mg/dL (ref 0–200)
HDL: 40.6 mg/dL (ref >39.00)
LDL Cholesterol: 83 mg/dL (ref 0–99)
NonHDL: 98.4
TRIGLYCERIDES: 75 mg/dL (ref 0.0–149.0)
Total CHOL/HDL Ratio: 3
VLDL: 15 mg/dL (ref 0.0–40.0)

## 2014-09-19 LAB — CBC WITH DIFFERENTIAL/PLATELET
BASOS PCT: 0.4 % (ref 0.0–3.0)
Basophils Absolute: 0 10*3/uL (ref 0.0–0.1)
EOS PCT: 1.9 % (ref 0.0–5.0)
Eosinophils Absolute: 0.1 10*3/uL (ref 0.0–0.7)
HCT: 37.2 % — ABNORMAL LOW (ref 39.0–52.0)
HEMOGLOBIN: 11.9 g/dL — AB (ref 13.0–17.0)
LYMPHS ABS: 2.4 10*3/uL (ref 0.7–4.0)
Lymphocytes Relative: 34.1 % (ref 12.0–46.0)
MCHC: 32 g/dL (ref 30.0–36.0)
MCV: 79.4 fl (ref 78.0–100.0)
MONO ABS: 0.5 10*3/uL (ref 0.1–1.0)
Monocytes Relative: 7.9 % (ref 3.0–12.0)
Neutro Abs: 3.9 10*3/uL (ref 1.4–7.7)
Neutrophils Relative %: 55.7 % (ref 43.0–77.0)
Platelets: 285 10*3/uL (ref 150.0–400.0)
RBC: 4.68 Mil/uL (ref 4.22–5.81)
RDW: 16.2 % — ABNORMAL HIGH (ref 11.5–15.5)
WBC: 6.9 10*3/uL (ref 4.0–10.5)

## 2014-09-19 LAB — BASIC METABOLIC PANEL
BUN: 22 mg/dL (ref 6–23)
CO2: 24 meq/L (ref 19–32)
CREATININE: 1.2 mg/dL (ref 0.4–1.5)
Calcium: 9.3 mg/dL (ref 8.4–10.5)
Chloride: 106 mEq/L (ref 96–112)
GFR: 65.95 mL/min (ref >60.00)
GLUCOSE: 129 mg/dL — AB (ref 70–99)
Potassium: 5.2 mEq/L — ABNORMAL HIGH (ref 3.5–5.1)
Sodium: 140 mEq/L (ref 135–145)

## 2014-09-19 LAB — POCT URINALYSIS DIPSTICK
BILIRUBIN UA: NEGATIVE
Blood, UA: NEGATIVE
Glucose, UA: NEGATIVE
KETONES UA: NEGATIVE
Leukocytes, UA: NEGATIVE
Nitrite, UA: NEGATIVE
PROTEIN UA: NEGATIVE
SPEC GRAV UA: 1.02
Urobilinogen, UA: 0.2
pH, UA: 5

## 2014-09-19 LAB — HEPATIC FUNCTION PANEL
ALK PHOS: 102 U/L (ref 39–117)
ALT: 36 U/L (ref 0–53)
AST: 33 U/L (ref 0–37)
Albumin: 4 g/dL (ref 3.5–5.2)
Bilirubin, Direct: 0.1 mg/dL (ref 0.0–0.3)
TOTAL PROTEIN: 7 g/dL (ref 6.0–8.3)
Total Bilirubin: 0.8 mg/dL (ref 0.2–1.2)

## 2014-09-19 LAB — TSH: TSH: 1.54 u[IU]/mL (ref 0.35–4.50)

## 2014-09-19 LAB — PSA: PSA: 0.73 ng/mL (ref 0.10–4.00)

## 2014-09-19 LAB — MICROALBUMIN / CREATININE URINE RATIO
Creatinine,U: 257.6 mg/dL
MICROALB UR: 0.7 mg/dL (ref 0.0–1.9)
Microalb Creat Ratio: 0.3 mg/g (ref 0.0–30.0)

## 2014-09-19 LAB — HEMOGLOBIN A1C: HEMOGLOBIN A1C: 6.5 % (ref 4.6–6.5)

## 2014-09-26 ENCOUNTER — Encounter: Payer: BC Managed Care – PPO | Admitting: Internal Medicine

## 2014-09-26 ENCOUNTER — Encounter: Payer: BC Managed Care – PPO | Admitting: Family Medicine

## 2014-10-06 ENCOUNTER — Encounter: Payer: Self-pay | Admitting: Family Medicine

## 2014-10-06 ENCOUNTER — Ambulatory Visit (INDEPENDENT_AMBULATORY_CARE_PROVIDER_SITE_OTHER): Payer: BC Managed Care – PPO | Admitting: Family Medicine

## 2014-10-06 VITALS — BP 130/86 | HR 64 | Temp 97.4°F | Ht 70.0 in | Wt 197.0 lb

## 2014-10-06 DIAGNOSIS — I5189 Other ill-defined heart diseases: Secondary | ICD-10-CM

## 2014-10-06 DIAGNOSIS — D649 Anemia, unspecified: Secondary | ICD-10-CM | POA: Insufficient documentation

## 2014-10-06 DIAGNOSIS — F325 Major depressive disorder, single episode, in full remission: Secondary | ICD-10-CM | POA: Insufficient documentation

## 2014-10-06 DIAGNOSIS — IMO0001 Reserved for inherently not codable concepts without codable children: Secondary | ICD-10-CM

## 2014-10-06 DIAGNOSIS — E785 Hyperlipidemia, unspecified: Secondary | ICD-10-CM

## 2014-10-06 DIAGNOSIS — Z23 Encounter for immunization: Secondary | ICD-10-CM

## 2014-10-06 DIAGNOSIS — I519 Heart disease, unspecified: Secondary | ICD-10-CM

## 2014-10-06 DIAGNOSIS — Z0000 Encounter for general adult medical examination without abnormal findings: Secondary | ICD-10-CM

## 2014-10-06 DIAGNOSIS — E119 Type 2 diabetes mellitus without complications: Secondary | ICD-10-CM

## 2014-10-06 NOTE — Assessment & Plan Note (Signed)
Stable today with mild dyspnea on exertion with intense activity. Appears euvolemic.

## 2014-10-06 NOTE — Assessment & Plan Note (Signed)
Well controlled on atorvastatin.

## 2014-10-06 NOTE — Patient Instructions (Addendum)
I think things look great today.   I am thrilled with the a1c of 6.5 and your desire to push that # down even further. I am glad you are back on the eating well track and exercising again. When your knee gets better, you can consider the bike again. Let's check in 3 months from now and hope for an a1c closer to 6.   Thanks for donating blood and updating on me on that (makes me feel better about your slight anemia)  Cholesterol looks great.   58 y.o. male presenting for annual physical.  Health Maintenance counseling: 1. Anticipatory guidance: Patient counseled regarding regular dental exams, wearing seatbelts.  2. Risk factor reduction:  Advised patient of need for regular exercise and diet rich and fruits and vegetables to reduce risk of heart attack and stroke. Patient is doing very well with these goals.  3. Immunizations/screenings/ancillary studies Health Maintenance Due  Topic Date Due  . Foot Exam  02/03/1966  . Ophthalmology Exam -please schedule and send [place] a copy 02/03/1966  . Pneumococcal Polysaccharide Vaccine (##2)-next visit since just gave influenza shot today 04/17/2014

## 2014-10-06 NOTE — Assessment & Plan Note (Addendum)
Continue metformin as well controlled. F/u 3 months. Patient with a1c goal of closer to 6. Continue benazepril for renal protection.

## 2014-10-06 NOTE — Addendum Note (Signed)
Addended by: Marin Olp on: 10/06/2014 04:58 PM   Modules accepted: Level of Service

## 2014-10-06 NOTE — Progress Notes (Signed)
Isaiah Reddish, MD Phone: [phone]  Subjective:  Patient presents today to establish care with me as their new primary care provider and for annual physical. Patient was formerly a patient of Dr. Arnoldo Morale. Chief complaint-noted.   Recently saw orthopedist for likely torn meniscus which would be 3rd time. Walks every morning with dog. Eating well. Was using stationary bike but knee has taken this back. Does some pushups and squats and stretches each AM. Thinks a1c being up was due to weight being so low previously. Was focused on carbs/stress and has done better recently. Great deal of stress with wife with omentum cancer.   DIABETES Type II-controlled  Lab Results  Component Value Date   HGBA1C 6.5 09/19/2014   HGBA1C 5.9* 03/07/2014   HGBA1C 6.4 11/25/2013  Regular Exercise-yes On Aspirin-yes On statin-yes Daily foot monitoring-yes ROS- Denies Vision changes, feet or hand numbness/pain/tingling. Denies  Hypoglycemia symptoms (shaky, sweaty, hungry, weak anxious, tremor, palpitations, confusion, behavior change).   Hyperlipidemia-controlled  Lab Results  Component Value Date   LDLCALC 83 09/19/2014  On statin: yes Regular exercise: yes Diet: doing well ROS- no chest pain or shortness of breath. No myalgias  Diastolic dysfunction-controlled No recent weight gain. Patient at baseline seems to get shortness of breath with intense activity such as heavy yardwork. No change since his catheterization in 2011 which was normal largely ROS- no chest pain, nausea, diaphoresiss  The following were reviewed and entered/updated in epic: Past Medical History  Diagnosis Date  . Hyperlipidemia   . Hypertension   . Essential tremor     BENIGN--   . OA (osteoarthritis)   . Diverticulosis   . Diastolic dysfunction, left ventricle     GRADE 2  . Left ureteral calculus   . Urgency of urination   . Diabetes mellitus without complication    Patient Active Problem List   Diagnosis Date  Noted  . Diabetes mellitus type II, controlled 05/11/2007    Priority: High  . Depression 10/06/2014    Priority: Medium  . Anemia 10/06/2014    Priority: Medium  . Diastolic dysfunction 82/95/6213    Priority: Medium  . TREMOR, ESSENTIAL 09/06/2007    Priority: Medium  . Hyperlipidemia 05/11/2007    Priority: Medium  . UTI (urinary tract infection) 11/11/2013    Priority: Low  . History of herpes zoster 07/17/2009    Priority: Low  . Osteoarthritis 04/16/2008    Priority: Low   Past Surgical History  Procedure Laterality Date  . Vasectomy reversal  1993  . Knee arthroscopy w/ debridement Right X3  LAST ONE 12-31-2008  . Anterior cervical decomp/discectomy fusion  04-24-2008    C5  -- C6  . Shoulder arthroscopy Right X2  LAST ONE 04-25-2006  . Shoulder arthroscopy with rotator cuff repair and subacromial decompression Left 05-20-2010    AND DEBRIDEMENT BICEP TENDON/ OPEN DISTAL CLAVICAL RESECTON  . Tonsillectomy  AGE 56  . Cardiac catheterization  06-21-2010  DR [facility]    RCA 25%/ MINIMAL DISEASE LM, CX  . Cardiovascular stress test  06-10-2010    NORMAL NUCLEAR STUDY BUT  CLINICALL ABNORMAL (CHEST PAIN, ST ABNORMALITY, HYPOTENSION)/   EF 61%  . Transthoracic echocardiogram  07-21-2010    GRADE II DIASTOLIC DYSFUNCTION/  EF 55%/  MILD DILATED AORTIC ROOT  . Cystoscopy with retrograde pyelogram, ureteroscopy and stent placement Left 10/30/2013    Procedure: CYSTOSCOPY WITH RETROGRADE PYELOGRAM, URETEROSCOPY AND STENT PLACEMENT;  Surgeon: Molli Hazard, MD;  Location: [facility];  Service: Urology;  Laterality: Left;  . Holmium laser application Left 52/06/7823    Procedure: HOLMIUM LASER APPLICATION;  Surgeon: Molli Hazard, MD;  Location: [facility];  Service: Urology;  Laterality: Left;    Family History  Problem Relation Age of Onset  . Arthritis Mother   . Hypertension Mother   . Hyperlipidemia Mother   .  Hypertension Father   . Hyperlipidemia Father     Medications- reviewed and updated Current Outpatient Prescriptions  Medication Sig Dispense Refill  . aspirin 81 MG chewable tablet Chew 81 mg by mouth daily.      Marland Kitchen atorvastatin (LIPITOR) 10 MG tablet TAKE 1 TABLET BY MOUTH EVERY DAY  30 tablet  9  . benazepril (LOTENSIN) 5 MG tablet TAKE 1/2 TABLET EVERY DAY WITH GOAL TO INCREASE TO 1 TABLET EVERY DAY  30 tablet  4  . DULoxetine (CYMBALTA) 60 MG capsule Take 60 mg by mouth daily.      . metFORMIN (GLUCOPHAGE-XR) 500 MG 24 hr tablet Take 1,000 mg by mouth daily with breakfast.      . Multiple Vitamin (MULTIVITAMIN WITH MINERALS) TABS tablet Take 1 tablet by mouth once a week.       . diazepam (VALIUM) 5 MG tablet TAKE 1/2 TABLET EVERY 8 HOURS AS NEEDED  45 tablet  1   No current facility-administered medications for this visit.    Allergies-reviewed and updated Allergies  Allergen Reactions  . Celebrex [Celecoxib] Other (See Comments)    GI UPSET  . Clindamycin Hcl Other (See Comments)    GI UPSET  . Codeine Other (See Comments)    GI UPSET  . Erythromycin Other (See Comments)    GI UPSET    History   Social History  . Marital Status: Married    Spouse Name: N/A    Number of Children: N/A  . Years of Education: N/A   Social History Main Topics  . Smoking status: Former Smoker -- 1.00 packs/day for 20 years    Types: Cigarettes    Quit date: 12/26/2000  . Smokeless tobacco: Never Used  . Alcohol Use: 1.0 oz/week    2 drink(s) per week  . Drug Use: No  . Sexual Activity: None   Other Topics Concern  . None   Social History Narrative  . None    ROS--See HPI   Objective: BP 130/86  Pulse 64  Temp(Src) 97.4 F (36.3 C)  Ht 5\' 10"  (1.778 m)  Wt 197 lb (89.359 kg)  BMI 28.27 kg/m2 Gen: NAD, resting comfortably in chair, looks younger than stated age HEENT: Mucous membranes are moist. Oropharynx normal. Good dentition. TM normal.  CV: RRR no murmurs rubs or  gallops Lungs: CTAB no crackles, wheeze, rhonchi Abdomen: soft/nontender/nondistended/normal bowel sounds.  Ext: no edema. DM foot exam normal. Does have a right big toe with former medial 3rd nail bed removal that has uneven edge and potential source of infection.  Skin: warm, dry, no rash, upper extremities and trunk without obvious premalignancies Neuro: grossly normal, moves all extremities, PERRLA   Assessment/Plan:  58 y.o. male presenting for annual physical.  Health Maintenance counseling: 1. Anticipatory guidance: Patient counseled regarding regular dental exams, wearing seatbelts.  2. Risk factor reduction:  Advised patient of need for regular exercise and diet rich and fruits and vegetables to reduce risk of heart attack and stroke. Patient is doing very well with these goals.  3. Immunizations/screenings/ancillary studies Health Maintenance Due  Topic Date Due  . Foot Exam -completed today 02/03/1966  . Ophthalmology Exam -please schedule and send [place] a copy 02/03/1966  . Pneumococcal Polysaccharide Vaccine (##2)-next visit since just gave influenza shot today 04/17/2014    Diabetes mellitus type II, controlled Continue metformin as well controlled. F/u 3 months. Patient with a1c goal of closer to 6. Continue benazepril for renal protection.   Hyperlipidemia Well controlled on atorvastatin.   Diastolic dysfunction Stable today with mild dyspnea on exertion with intense activity. Appears euvolemic.

## 2014-10-08 ENCOUNTER — Telehealth: Payer: Self-pay | Admitting: Family Medicine

## 2014-10-08 MED ORDER — DULOXETINE HCL 60 MG PO CPEP: 60.0000 mg | ORAL_CAPSULE | Freq: Every day | ORAL | Status: DC

## 2014-10-08 NOTE — Telephone Encounter (Signed)
[id number] [place], [place] - [address] is requesting re-fill on DULoxetine (CYMBALTA) 60 MG capsule

## 2014-10-08 NOTE — Telephone Encounter (Signed)
Medication refilled

## 2014-10-20 ENCOUNTER — Telehealth: Payer: Self-pay | Admitting: Family Medicine

## 2014-10-20 MED ORDER — BENAZEPRIL HCL 5 MG PO TABS: 5.0000 mg | ORAL_TABLET | Freq: Every day | ORAL | Status: DC

## 2014-10-20 NOTE — Telephone Encounter (Signed)
[id number] [place], [place] - [address] is requesting re-fill on benazepril (LOTENSIN) 5 MG tablet

## 2014-10-20 NOTE — Telephone Encounter (Signed)
Medication refilled

## 2014-12-02 ENCOUNTER — Other Ambulatory Visit: Payer: Self-pay | Admitting: Family Medicine

## 2014-12-08 ENCOUNTER — Telehealth: Payer: Self-pay

## 2014-12-08 NOTE — Telephone Encounter (Signed)
Did not discuss at physical. Can give 1 refill but ask patient to follow up with me if needs further refills before he runs out.

## 2014-12-08 NOTE — Telephone Encounter (Signed)
Rx request for Diazepam 5 mg tablet- Tale 1/2 tablet every 8 hours as needed  Pharm:  [facility]  Pls advise.

## 2014-12-08 NOTE — Telephone Encounter (Signed)
Is this ok to refill?  

## 2014-12-09 MED ORDER — DIAZEPAM 5 MG PO TABS: 5.0000 mg | ORAL_TABLET | Freq: Three times a day (TID) | ORAL | Status: DC | PRN

## 2014-12-09 NOTE — Telephone Encounter (Signed)
Medication faxed

## 2015-01-07 ENCOUNTER — Encounter: Payer: Self-pay | Admitting: Family Medicine

## 2015-01-07 ENCOUNTER — Other Ambulatory Visit: Payer: Self-pay

## 2015-01-07 ENCOUNTER — Ambulatory Visit (INDEPENDENT_AMBULATORY_CARE_PROVIDER_SITE_OTHER): Payer: BLUE CROSS/BLUE SHIELD | Admitting: Family Medicine

## 2015-01-07 VITALS — BP 100/72 | Temp 97.7°F | Wt 203.0 lb

## 2015-01-07 DIAGNOSIS — I5189 Other ill-defined heart diseases: Secondary | ICD-10-CM

## 2015-01-07 DIAGNOSIS — E119 Type 2 diabetes mellitus without complications: Secondary | ICD-10-CM

## 2015-01-07 DIAGNOSIS — Z23 Encounter for immunization: Secondary | ICD-10-CM

## 2015-01-07 DIAGNOSIS — I519 Heart disease, unspecified: Secondary | ICD-10-CM

## 2015-01-07 DIAGNOSIS — R1011 Right upper quadrant pain: Secondary | ICD-10-CM

## 2015-01-07 LAB — COMPREHENSIVE METABOLIC PANEL
ALBUMIN: 4.1 g/dL (ref 3.5–5.2)
ALK PHOS: 98 U/L (ref 39–117)
ALT: 30 U/L (ref 0–53)
AST: 26 U/L (ref 0–37)
BUN: 17 mg/dL (ref 6–23)
CO2: 30 mEq/L (ref 19–32)
Calcium: 9.7 mg/dL (ref 8.4–10.5)
Chloride: 107 mEq/L (ref 96–112)
Creatinine, Ser: 1.03 mg/dL (ref 0.40–1.50)
GFR: 78.58 mL/min (ref >60.00)
GLUCOSE: 71 mg/dL (ref 70–99)
POTASSIUM: 4.9 meq/L (ref 3.5–5.1)
Sodium: 142 mEq/L (ref 135–145)
Total Bilirubin: 0.8 mg/dL (ref 0.2–1.2)
Total Protein: 7 g/dL (ref 6.0–8.3)

## 2015-01-07 LAB — CBC WITH DIFFERENTIAL/PLATELET
Basophils Absolute: 0 10*3/uL (ref 0.0–0.1)
Basophils Relative: 0.4 % (ref 0.0–3.0)
EOS ABS: 0.1 10*3/uL (ref 0.0–0.7)
EOS PCT: 1.7 % (ref 0.0–5.0)
HCT: 41.2 % (ref 39.0–52.0)
HEMOGLOBIN: 13 g/dL (ref 13.0–17.0)
LYMPHS ABS: 2.3 10*3/uL (ref 0.7–4.0)
Lymphocytes Relative: 33.4 % (ref 12.0–46.0)
MCHC: 31.7 g/dL (ref 30.0–36.0)
MCV: 82.6 fl (ref 78.0–100.0)
MONOS PCT: 9.7 % (ref 3.0–12.0)
Monocytes Absolute: 0.7 10*3/uL (ref 0.1–1.0)
NEUTROS ABS: 3.9 10*3/uL (ref 1.4–7.7)
Neutrophils Relative %: 54.8 % (ref 43.0–77.0)
Platelets: 282 10*3/uL (ref 150.0–400.0)
RBC: 4.98 Mil/uL (ref 4.22–5.81)
RDW: 17.3 % — ABNORMAL HIGH (ref 11.5–15.5)
WBC: 7 10*3/uL (ref 4.0–10.5)

## 2015-01-07 LAB — LIPASE: LIPASE: 137 U/L — AB (ref 11.0–59.0)

## 2015-01-07 LAB — HEMOGLOBIN A1C: Hgb A1c MFr Bld: 6.9 % — ABNORMAL HIGH (ref 4.6–6.5)

## 2015-01-07 MED ORDER — METFORMIN HCL ER 500 MG PO TB24: 1000.0000 mg | ORAL_TABLET | Freq: Every day | ORAL | Status: DC

## 2015-01-07 NOTE — Assessment & Plan Note (Signed)
Stable symptoms of getting winded with heavy yardwork. Once again Cath 2011 largely normal and no changes since that time. No lasix at this time and appears euvolemic

## 2015-01-07 NOTE — Progress Notes (Signed)
Garret Reddish, MD Phone: [phone]  Subjective:   Isaiah Dunn is a 59 y.o. year old very pleasant male patient who presents with the following:  DIABETES Type II-controlled  Lab Results  Component Value Date   HGBA1C 6.5 09/19/2014   HGBA1C 5.9* 03/07/2014   HGBA1C 6.4 11/25/2013  Medications taking and tolerating-yes, metformin XR 1g in AM Blood Sugars per patient-fasting-has meter does not use Diet-some poor choices over Christmas Regular Exercise-yes, long walk each day with dog 3x a day On Aspirin-yes On statin-yes Daily foot monitoring-yes ROS- slight night sweats at times, does not occur with peanut butter. 1-2x nocturia but drinks a lot of water. denies other Hypoglycemia symptoms (shaky, sweaty, hungry, weak anxious, tremor, palpitations, confusion, behavior change).   Diastolic dysfunction-controlled No recent weight gain. Continued at baseline seems to get shortness of breath with intense activity such as heavy yardwork.  No trouble with walking.  No change since his catheterization in 2011 which was normal largely. ROS- no chest pain, nausea, diaphoresis  RUQ pain-new Feels like muscle is tight or pulled muscle, when he eats pain radiates down slightly. Almost feels small knot for 2-3 days. Has had intemittent pruritis in that area in the past. Slight discomfort perhaps 1-2/10. Worsening pressure with meals and feels like he needs to use the restroom ROS-no fever/chlls/nausea/vomiting/constipation/diarrhea. Daily BM  Past Medical History- Patient Active Problem List   Diagnosis Date Noted  . Diabetes mellitus type II, controlled 05/11/2007    Priority: High  . Depression 10/06/2014    Priority: Medium  . Anemia 10/06/2014    Priority: Medium  . Diastolic dysfunction 48/54/6270    Priority: Medium  . TREMOR, ESSENTIAL 09/06/2007    Priority: Medium  . Hyperlipidemia 05/11/2007    Priority: Medium  . UTI (urinary tract infection) 11/11/2013   Priority: Low  . History of herpes zoster 07/17/2009    Priority: Low  . Osteoarthritis 04/16/2008    Priority: Low  . RUQ pain 01/07/2015   Medications- reviewed and updated Current Outpatient Prescriptions  Medication Sig Dispense Refill  . aspirin 81 MG chewable tablet Chew 81 mg by mouth daily.    Marland Kitchen atorvastatin (LIPITOR) 10 MG tablet TAKE 1 TABLET BY MOUTH EVERY DAY 30 tablet 9  . benazepril (LOTENSIN) 5 MG tablet Take 1 tablet (5 mg total) by mouth daily. 30 tablet 3  . diazepam (VALIUM) 5 MG tablet Take 1 tablet (5 mg total) by mouth every 8 (eight) hours as needed for anxiety. 45 tablet 1  . DULoxetine (CYMBALTA) 60 MG capsule TAKE 1 CAPSULE (60 MG TOTAL) BY MOUTH DAILY. 60 capsule 0  . metFORMIN (GLUCOPHAGE-XR) 500 MG 24 hr tablet Take 2 tablets (1,000 mg total) by mouth daily with breakfast. 60 tablet 3  . Multiple Vitamin (MULTIVITAMIN WITH MINERALS) TABS tablet Take 1 tablet by mouth once a week.      Objective: BP 100/72 mmHg  Temp(Src) 97.7 F (36.5 C)  Wt 203 lb (92.08 kg) Gen: NAD, resting comfortably on table CV: RRR no murmurs rubs or gallops Lungs: CTAB no crackles, wheeze, rhonchi Abdomen: soft/moderately tender to palpation RUQ with some radiation toward umbilicus, otherwise nontender/nondistended/normal bowel sounds. No rebound or guarding.  Ext: no edema Skin: warm, dry, no rash over abdomen   Assessment/Plan:  Diabetes mellitus type II, controlled Suspect good control with last a1c 6.5 on metformin 1g XR in AM. Repeat a1c today. Patient personal goal closer to 6.    Diastolic dysfunction Stable symptoms of  getting winded with heavy yardwork. Once again Cath 2011 largely normal and no changes since that time. No lasix at this time and appears euvolemic   RUQ pain Concern for gallstones or biliary colic. Check cbc, cmet, lipase, RUQ ultrasound. No shortness of breath increased from baseline. Pain localized to RUQ and reproducible on exam and highly doubt  cardiac. Follow up based on labs and ultrasound.     Return precautions advised. Specifically reasons to seek after hours care.   [facility]  Orders Placed This Encounter  Procedures  . US Abdomen Limited RUQ    Standing Status: Future     Number of Occurrences:      Standing Expiration Date: 03/07/2016    Order Specific Question:  Reason for Exam (SYMPTOM  OR DIAGNOSIS REQUIRED)    Answer:  RUQ pain, worse with meals, concern gallstones    Order Specific Question:  Preferred imaging location?    Answer:  [address]  . Hemoglobin A1c    [place]  . Comprehensive metabolic panel    [place]  . Lipase  . CBC with Differential

## 2015-01-07 NOTE — Patient Instructions (Addendum)
Check a1c today  Pneumovax today  . Ophthalmology Exam -please schedule and send [place] a copy   For abdominal pain, I am probably most concerned about gallstones. Check bloodwork, get ultrasound. Follow up if worsening pain, fevers, nausea, vomiting, please seek care.

## 2015-01-07 NOTE — Assessment & Plan Note (Signed)
Suspect good control with last a1c 6.5 on metformin 1g XR in AM. Repeat a1c today. Patient personal goal closer to 6.

## 2015-01-07 NOTE — Assessment & Plan Note (Signed)
Concern for gallstones or biliary colic. Check cbc, cmet, lipase, RUQ ultrasound. No shortness of breath increased from baseline. Pain localized to RUQ and reproducible on exam and highly doubt cardiac. Follow up based on labs and ultrasound.

## 2015-01-07 NOTE — Addendum Note (Signed)
Addended by: Clyde Lundborg A on: 01/07/2015 10:20 AM   Modules accepted: Orders

## 2015-01-14 ENCOUNTER — Ambulatory Visit
Admission: RE | Admit: 2015-01-14 | Discharge: 2015-01-14 | Disposition: A | Discharge status: Home / Self Care | Payer: BLUE CROSS/BLUE SHIELD | Source: Ambulatory Visit | Attending: Family Medicine | Admitting: Family Medicine

## 2015-01-14 DIAGNOSIS — R1011 Right upper quadrant pain: Secondary | ICD-10-CM

## 2015-02-02 ENCOUNTER — Other Ambulatory Visit: Payer: Self-pay | Admitting: Family Medicine

## 2015-02-23 ENCOUNTER — Ambulatory Visit: Payer: Self-pay | Admitting: Orthopedic Surgery

## 2015-02-24 ENCOUNTER — Other Ambulatory Visit: Payer: Self-pay | Admitting: Family Medicine

## 2015-02-24 ENCOUNTER — Telehealth: Payer: Self-pay

## 2015-02-24 MED ORDER — ATORVASTATIN CALCIUM 10 MG PO TABS: 10.0000 mg | ORAL_TABLET | Freq: Every day | ORAL | Status: DC

## 2015-02-24 NOTE — Telephone Encounter (Signed)
[facility] refill request for ATORVASTATIN 10MG . Last filled 02/02/15

## 2015-02-24 NOTE — Telephone Encounter (Signed)
Medication refilled

## 2015-03-05 ENCOUNTER — Ambulatory Visit: Payer: Self-pay | Admitting: Orthopedic Surgery

## 2015-03-05 NOTE — H&P (Signed)
Isaiah Dunn is an 59 y.o. male.   Chief Complaint: R knee pain HPI: The patient is a 59 year old male who presents with knee complaints. The patient is seen today for a surgical consult and in referral from Dr. Drema Dallas. The patient reports right knee symptoms which began 6 month(s) ago without any known injury. The patient describes their pain as aching. Prior to being seen today the patient was previously evaluated by by Dr. Drema Dallas. Previous work-up for this problem has included knee x-rays and knee MRI. Past treatment for this problem has included application of ice, knee brace, intra-articular injection of corticosteroids, nonsteroidal anti-inflammatory drugs and non-opioid analgesics. Symptoms are reported to be located in the right knee and include medial knee pain, swelling, decreased range of motion, locking and instability. The patient reports that symptoms radiate to the right lower leg. Onset of symptoms was with symptoms now occurring constantly. Symptoms are exacerbated by weight bearing. Current treatment includes application of ice, nonsteroidal anti-inflammatory drugs and non-opioid analgesics. Risk factors include arthroscopic surgery (x3 by Dr. Shellia Carwin).  Past Medical History  Diagnosis Date  . Hyperlipidemia   . Hypertension   . Essential tremor     BENIGN--   . OA (osteoarthritis)   . Diverticulosis   . Diastolic dysfunction, left ventricle     GRADE 2  . Left ureteral calculus   . Urgency of urination   . Diabetes mellitus without complication     Past Surgical History  Procedure Laterality Date  . Vasectomy reversal  1993  . Knee arthroscopy w/ debridement Right X3  LAST ONE 12-31-2008  . Anterior cervical decomp/discectomy fusion  04-24-2008    C5  -- C6  . Shoulder arthroscopy Right X2  LAST ONE 04-25-2006  . Shoulder arthroscopy with rotator cuff repair and subacromial decompression Left 05-20-2010    AND DEBRIDEMENT BICEP TENDON/ OPEN DISTAL CLAVICAL  RESECTON  . Tonsillectomy  AGE 41  . Cardiac catheterization  06-21-2010  DR [facility]    RCA 25%/ MINIMAL DISEASE LM, CX  . Cardiovascular stress test  06-10-2010    NORMAL NUCLEAR STUDY BUT  CLINICALL ABNORMAL (CHEST PAIN, ST ABNORMALITY, HYPOTENSION)/   EF 61%  . Transthoracic echocardiogram  07-21-2010    GRADE II DIASTOLIC DYSFUNCTION/  EF 55%/  MILD DILATED AORTIC ROOT  . Cystoscopy with retrograde pyelogram, ureteroscopy and stent placement Left 10/30/2013    Procedure: CYSTOSCOPY WITH RETROGRADE PYELOGRAM, URETEROSCOPY AND STENT PLACEMENT;  Surgeon: Molli Hazard, MD;  Location: [facility];  Service: Urology;  Laterality: Left;  . Holmium laser application Left 81/12/9145    Procedure: HOLMIUM LASER APPLICATION;  Surgeon: Molli Hazard, MD;  Location: [facility];  Service: Urology;  Laterality: Left;    Family History  Problem Relation Age of Onset  . Arthritis Mother   . Hypertension Mother   . Hyperlipidemia Mother   . Hypertension Father   . Hyperlipidemia Father    Social History:  reports that he quit smoking about 14 years ago. His smoking use included Cigarettes. He has a 20 pack-year smoking history. He has never used smokeless tobacco. He reports that he drinks about 1.0 oz of alcohol per week. He reports that he does not use illicit drugs.  Allergies:  Allergies  Allergen Reactions  . Celebrex [Celecoxib] Other (See Comments)    GI UPSET  . Clindamycin Hcl Other (See Comments)    GI UPSET  . Codeine Other (See Comments)  GI UPSET  . Erythromycin Other (See Comments)    GI UPSET     (Not in a hospital admission)  No results found for this or any previous visit (from the past 48 hour(s)). No results found.  Review of Systems  Constitutional: Negative.   HENT: Negative.   Eyes: Negative.   Respiratory: Negative.   Cardiovascular: Negative.   Gastrointestinal: Negative.   Genitourinary: Negative.    Musculoskeletal: Positive for joint pain.  Skin: Negative.   Neurological: Negative.   Psychiatric/Behavioral: Negative.     There were no vitals taken for this visit. Physical Exam  Constitutional: He is oriented to person, place, and time. He appears well-developed and well-nourished.  HENT:  Head: Normocephalic and atraumatic.  Eyes: Conjunctivae and EOM are normal. Pupils are equal, round, and reactive to light.  Neck: Normal range of motion. Neck supple.  Cardiovascular: Normal rate.   Respiratory: Effort normal and breath sounds normal.  GI: Soft. Bowel sounds are normal.  Musculoskeletal:  On exam he is tender along the medial joint line. Positive McMurray.  Knee exam on inspection reveals no evidence of soft tissue swelling, ecchymosis, deformity, or erythema. On palpation there is no tenderness in the lateral joint line. No patellofemoral pain with compression. Nontender over the fibular head of the peroneal nerve. Nontender over the quadriceps insertion of the patellar ligament insertion. The range of motion was full. Provocative maneuvers revealed a negative Lachman, negative anterior and posterior drawer. No instability was noted with varus and valgus stressing at 0 or 30 degrees. On manual motor test the quadriceps and hamstrings were 5/5. Sensory exam was intact to light touch.  Neurological: He is alert and oriented to person, place, and time. He has normal reflexes.  Skin: Skin is warm and dry.  Psychiatric: He has a normal mood and affect.    AP standing and lateral demonstrates moderate medial joint space narrowing on the right and some patellofemoral arthrosis.  MRI of the knee demonstrates meniscus tear, moderate loss of the medial compartment, and the patellofemoral joint.  Assessment/Plan R knee MMT  1. Symptomatic medial meniscal tear right knee refractory. 2. Symptomatic moderate chondromalacia patellofemoral joint medial compartment, possible chondral  flap tear.  We discussed options at this point in time. He has exhausted conservative treatment. Discussed repeating the knee arthroscopy.  I had a long discussion with the patient concerning the risks and benefits of knee arthroscopy including help from the arthroscopic procedure as well as no help from the arthroscopic procedure or worsening of symptoms. Also discussed infection, DVT, PE, anesthetic complications, etc. Also discussed the possibility of repeat arthroscopic surgery required in the future or total knee replacement. I provided the patient with an illustrated handout and discussed it in detail as well as discussed the postoperative and perioperative courses and return to functional activities including work. Need for postoperative DVT prophylaxis was discussed as well.  Indicated to Mr. Canepa, he goes by Isaiah Dunn, that at some point, arthroscopic debridement, partial meniscectomy, evaluate the chondral surfaces. He may require Visco supplementation and ultimately have restrictions requiring total knee replacement in the future. The patient uses an upright brace for walking on uneven surfaces, maintain good arch support, quad strengthening. No history of DVT. We will have him undergo preoperative decolonization and a prescription was given. He was a soccer player in the past. He has had three scopes in the past by Dr. Shellia Carwin. I appreciate the kind referral by Dr. Drema Dallas.  I secured his outpatient notes.  He did have an episode of urosepsis in 2014, Enterococcus. He was admitted and given Cipro, IV Vancomycin and Ziprazidone. He was discharged on Augmentin.   Plan R knee arthroscopy, partial medial meniscectomy, debridement  Silveria Botz M. PA-C for Dr. Tonita Cong 03/05/2015, 5:29 PM

## 2015-03-10 ENCOUNTER — Encounter (HOSPITAL_BASED_OUTPATIENT_CLINIC_OR_DEPARTMENT_OTHER): Payer: Self-pay | Admitting: *Deleted

## 2015-03-10 NOTE — Progress Notes (Addendum)
NPO AFTER MN WITH CLEAR LIQUIDS UNTIL 0700 (NO CREAM/ MILK PRODUCTS).  ARRIVE AT 1100. NEEDS ISTAT AND EKG.   PT CALLED AND STATED THAT HIS CASE HAS NEW START AT 1430 INSTEAD OF 1300. INSTRUCTIONS GIVEN TO ARRIVE AT 1230 AND NPO AFTER MN WITH EXCEPTION CLEAR LIQUIDS UNTIL 0830 (NO CREAM/ MILK PRODUCTS.

## 2015-03-11 HISTORY — PX: RETINAL DETACHMENT SURGERY: SHX105

## 2015-03-13 ENCOUNTER — Ambulatory Visit (HOSPITAL_BASED_OUTPATIENT_CLINIC_OR_DEPARTMENT_OTHER): Payer: BLUE CROSS/BLUE SHIELD | Admitting: Anesthesiology

## 2015-03-13 ENCOUNTER — Ambulatory Visit (HOSPITAL_BASED_OUTPATIENT_CLINIC_OR_DEPARTMENT_OTHER)
Admission: RE | Admit: 2015-03-13 | Discharge: 2015-03-13 | Disposition: A | Discharge status: Home / Self Care | Payer: BLUE CROSS/BLUE SHIELD | Source: Ambulatory Visit | Attending: Specialist | Admitting: Specialist

## 2015-03-13 ENCOUNTER — Encounter (HOSPITAL_BASED_OUTPATIENT_CLINIC_OR_DEPARTMENT_OTHER)
Admission: RE | Disposition: A | Discharge status: Home / Self Care | Payer: Self-pay | Source: Ambulatory Visit | Attending: Specialist

## 2015-03-13 ENCOUNTER — Encounter (HOSPITAL_BASED_OUTPATIENT_CLINIC_OR_DEPARTMENT_OTHER): Payer: Self-pay

## 2015-03-13 DIAGNOSIS — M23203 Derangement of unspecified medial meniscus due to old tear or injury, right knee: Secondary | ICD-10-CM | POA: Insufficient documentation

## 2015-03-13 DIAGNOSIS — S83206D Unspecified tear of unspecified meniscus, current injury, right knee, subsequent encounter: Secondary | ICD-10-CM

## 2015-03-13 DIAGNOSIS — Z888 Allergy status to other drugs, medicaments and biological substances status: Secondary | ICD-10-CM | POA: Insufficient documentation

## 2015-03-13 DIAGNOSIS — Z87442 Personal history of urinary calculi: Secondary | ICD-10-CM | POA: Insufficient documentation

## 2015-03-13 DIAGNOSIS — Z87891 Personal history of nicotine dependence: Secondary | ICD-10-CM | POA: Diagnosis not present

## 2015-03-13 DIAGNOSIS — I10 Essential (primary) hypertension: Secondary | ICD-10-CM | POA: Diagnosis not present

## 2015-03-13 DIAGNOSIS — E785 Hyperlipidemia, unspecified: Secondary | ICD-10-CM | POA: Diagnosis not present

## 2015-03-13 DIAGNOSIS — E119 Type 2 diabetes mellitus without complications: Secondary | ICD-10-CM | POA: Insufficient documentation

## 2015-03-13 DIAGNOSIS — Z7982 Long term (current) use of aspirin: Secondary | ICD-10-CM | POA: Diagnosis not present

## 2015-03-13 DIAGNOSIS — M1990 Unspecified osteoarthritis, unspecified site: Secondary | ICD-10-CM | POA: Insufficient documentation

## 2015-03-13 HISTORY — DX: Personal history of urinary calculi: Z87.442

## 2015-03-13 HISTORY — PX: KNEE ARTHROSCOPY WITH MEDIAL MENISECTOMY: SHX5651

## 2015-03-13 HISTORY — DX: Personal history of other infectious and parasitic diseases: Z86.19

## 2015-03-13 HISTORY — DX: Type 2 diabetes mellitus without complications: E11.9

## 2015-03-13 HISTORY — DX: Unspecified retinal disorder: H35.9

## 2015-03-13 HISTORY — DX: Unspecified tear of unspecified meniscus, current injury, right knee, initial encounter: S83.206A

## 2015-03-13 LAB — POCT I-STAT 4, (NA,K, GLUC, HGB,HCT)
Glucose, Bld: 98 mg/dL (ref 70–99)
HEMATOCRIT: 39 % (ref 39.0–52.0)
HEMOGLOBIN: 13.3 g/dL (ref 13.0–17.0)
POTASSIUM: 4.2 mmol/L (ref 3.5–5.1)
Sodium: 138 mmol/L (ref 135–145)

## 2015-03-13 LAB — GLUCOSE, CAPILLARY: GLUCOSE-CAPILLARY: 91 mg/dL (ref 70–99)

## 2015-03-13 SURGERY — ARTHROSCOPY, KNEE, WITH MEDIAL MENISCECTOMY
Anesthesia: General | Site: Knee | Laterality: Right

## 2015-03-13 MED ORDER — MIDAZOLAM HCL 5 MG/5ML IJ SOLN
INTRAMUSCULAR | Status: DC | PRN
  Administered 2015-03-13: 2 mg via INTRAVENOUS

## 2015-03-13 MED ORDER — EPHEDRINE SULFATE 50 MG/ML IJ SOLN
INTRAMUSCULAR | Status: DC | PRN
  Administered 2015-03-13: 5 mg via INTRAVENOUS
  Administered 2015-03-13 (×2): 10 mg via INTRAVENOUS

## 2015-03-13 MED ORDER — LACTATED RINGERS IV SOLN
INTRAVENOUS | Status: DC
  Administered 2015-03-13 (×2): via INTRAVENOUS
  Filled 2015-03-13: qty 1000

## 2015-03-13 MED ORDER — ACETAMINOPHEN 10 MG/ML IV SOLN
INTRAVENOUS | Status: DC | PRN
  Administered 2015-03-13: 1000 mg via INTRAVENOUS

## 2015-03-13 MED ORDER — KETOROLAC TROMETHAMINE 30 MG/ML IJ SOLN
INTRAMUSCULAR | Status: DC | PRN
  Administered 2015-03-13: 30 mg via INTRAVENOUS

## 2015-03-13 MED ORDER — OXYCODONE-ACETAMINOPHEN 5-325 MG PO TABS: 1.0000 | ORAL_TABLET | ORAL | Status: DC | PRN

## 2015-03-13 MED ORDER — DEXAMETHASONE SODIUM PHOSPHATE 4 MG/ML IJ SOLN
INTRAMUSCULAR | Status: DC | PRN
  Administered 2015-03-13: 10 mg via INTRAVENOUS

## 2015-03-13 MED ORDER — ONDANSETRON HCL 4 MG/2ML IJ SOLN
INTRAMUSCULAR | Status: DC | PRN
  Administered 2015-03-13: 4 mg via INTRAVENOUS

## 2015-03-13 MED ORDER — FENTANYL CITRATE 0.05 MG/ML IJ SOLN
INTRAMUSCULAR | Status: DC | PRN
  Administered 2015-03-13: 25 ug via INTRAVENOUS
  Administered 2015-03-13: 50 ug via INTRAVENOUS
  Administered 2015-03-13: 25 ug via INTRAVENOUS

## 2015-03-13 MED ORDER — SODIUM CHLORIDE 0.9 % IR SOLN
Status: DC | PRN
  Administered 2015-03-13: 6000 mL

## 2015-03-13 MED ORDER — MIDAZOLAM HCL 2 MG/2ML IJ SOLN
INTRAMUSCULAR | Status: AC
  Filled 2015-03-13: qty 2

## 2015-03-13 MED ORDER — LACTATED RINGERS IV SOLN
INTRAVENOUS | Status: DC
  Filled 2015-03-13: qty 1000

## 2015-03-13 MED ORDER — EPINEPHRINE HCL 1 MG/ML IJ SOLN
INTRAMUSCULAR | Status: DC | PRN
  Administered 2015-03-13 (×2): 1 mg

## 2015-03-13 MED ORDER — FENTANYL CITRATE 0.05 MG/ML IJ SOLN
25.0000 ug | INTRAMUSCULAR | Status: DC | PRN
  Filled 2015-03-13: qty 1

## 2015-03-13 MED ORDER — BUPIVACAINE-EPINEPHRINE 0.5% -1:200000 IJ SOLN
INTRAMUSCULAR | Status: DC | PRN
  Administered 2015-03-13: 20 mL

## 2015-03-13 MED ORDER — METHOCARBAMOL 500 MG PO TABS: 500.0000 mg | ORAL_TABLET | Freq: Three times a day (TID) | ORAL | Status: DC | PRN

## 2015-03-13 MED ORDER — CHLORHEXIDINE GLUCONATE 4 % EX LIQD
60.0000 mL | Freq: Once | CUTANEOUS | Status: DC
  Filled 2015-03-13: qty 60

## 2015-03-13 MED ORDER — PROPOFOL 10 MG/ML IV BOLUS
INTRAVENOUS | Status: DC | PRN
  Administered 2015-03-13: 200 mg via INTRAVENOUS

## 2015-03-13 MED ORDER — LIDOCAINE HCL (CARDIAC) 20 MG/ML IV SOLN
INTRAVENOUS | Status: DC | PRN
  Administered 2015-03-13: 60 mg via INTRAVENOUS

## 2015-03-13 MED ORDER — MEPERIDINE HCL 25 MG/ML IJ SOLN
6.2500 mg | INTRAMUSCULAR | Status: DC | PRN
  Filled 2015-03-13: qty 1

## 2015-03-13 MED ORDER — CEFAZOLIN SODIUM-DEXTROSE 2-3 GM-% IV SOLR
2.0000 g | INTRAVENOUS | Status: AC
  Administered 2015-03-13: 2 g via INTRAVENOUS
  Filled 2015-03-13: qty 50

## 2015-03-13 MED ORDER — FENTANYL CITRATE 0.05 MG/ML IJ SOLN
INTRAMUSCULAR | Status: AC
  Filled 2015-03-13: qty 4

## 2015-03-13 MED ORDER — PROMETHAZINE HCL 25 MG/ML IJ SOLN
6.2500 mg | INTRAMUSCULAR | Status: DC | PRN
  Filled 2015-03-13: qty 1

## 2015-03-13 MED ORDER — DOCUSATE SODIUM 100 MG PO CAPS: 100.0000 mg | ORAL_CAPSULE | Freq: Two times a day (BID) | ORAL | Status: DC | PRN

## 2015-03-13 SURGICAL SUPPLY — 45 items
BANDAGE ELASTIC 6 VELCRO ST LF (GAUZE/BANDAGES/DRESSINGS) ×2 IMPLANT
BLADE 4.2CUDA (BLADE) IMPLANT
BLADE CUDA SHAVER 3.5 (BLADE) ×2 IMPLANT
BLADE GREAT WHITE 4.2 (BLADE) IMPLANT
BNDG COHESIVE 6X5 TAN NS LF (GAUZE/BANDAGES/DRESSINGS) ×2 IMPLANT
BOOTIES KNEE HIGH SLOAN (MISCELLANEOUS) ×4 IMPLANT
CANISTER SUCT LVC 12 LTR MEDI- (MISCELLANEOUS) ×2 IMPLANT
CANISTER SUCTION 1200CC (MISCELLANEOUS) IMPLANT
CANISTER SUCTION 2500CC (MISCELLANEOUS) IMPLANT
CANNULA ACUFLEX KIT 5X76 (CANNULA) IMPLANT
CLOTH BEACON ORANGE TIMEOUT ST (SAFETY) ×2 IMPLANT
CUTTER MENISCUS  4.2MM (BLADE)
CUTTER MENISCUS 4.2MM (BLADE) IMPLANT
DRAPE ARTHROSCOPY W/POUCH 114 (DRAPES) ×2 IMPLANT
DRSG PAD ABDOMINAL 8X10 ST (GAUZE/BANDAGES/DRESSINGS) ×2 IMPLANT
DURAPREP 26ML APPLICATOR (WOUND CARE) ×2 IMPLANT
ELECT REM PT RETURN 9FT ADLT (ELECTROSURGICAL)
ELECTRODE REM PT RTRN 9FT ADLT (ELECTROSURGICAL) IMPLANT
GLOVE SURG SS PI 8.0 STRL IVOR (GLOVE) ×2 IMPLANT
GOWN PREVENTION PLUS LG XLONG (DISPOSABLE) IMPLANT
GOWN STRL REIN XL XLG (GOWN DISPOSABLE) IMPLANT
GOWN STRL REUS W/ TWL XL LVL3 (GOWN DISPOSABLE) ×2 IMPLANT
GOWN STRL REUS W/TWL LRG LVL3 (GOWN DISPOSABLE) ×2 IMPLANT
GOWN STRL REUS W/TWL XL LVL3 (GOWN DISPOSABLE) ×2
IV NS IRRIG 3000ML ARTHROMATIC (IV SOLUTION) ×4 IMPLANT
KNEE WRAP E Z 3 GEL PACK (MISCELLANEOUS) ×2 IMPLANT
MINI VAC (SURGICAL WAND) IMPLANT
NDL SAFETY ECLIPSE 18X1.5 (NEEDLE) ×1 IMPLANT
NEEDLE FILTER BLUNT 18X 1/2SAF (NEEDLE) ×1
NEEDLE FILTER BLUNT 18X1 1/2 (NEEDLE) ×1 IMPLANT
NEEDLE HYPO 18GX1.5 SHARP (NEEDLE) ×1
PACK ARTHROSCOPY DSU (CUSTOM PROCEDURE TRAY) ×2 IMPLANT
PACK BASIN DAY SURGERY FS (CUSTOM PROCEDURE TRAY) ×2 IMPLANT
PADDING CAST COTTON 6X4 STRL (CAST SUPPLIES) ×2 IMPLANT
RESECTOR FULL RADIUS 4.2MM (BLADE) IMPLANT
SET ARTHROSCOPY TUBING (MISCELLANEOUS) ×1
SET ARTHROSCOPY TUBING LN (MISCELLANEOUS) ×1 IMPLANT
SPONGE GAUZE 4X4 12PLY (GAUZE/BANDAGES/DRESSINGS) ×2 IMPLANT
SPONGE GAUZE 4X4 12PLY STER LF (GAUZE/BANDAGES/DRESSINGS) ×2 IMPLANT
SUT ETHILON 4 0 PS 2 18 (SUTURE) ×2 IMPLANT
SYR 30ML LL (SYRINGE) ×2 IMPLANT
SYRINGE 10CC LL (SYRINGE) ×2 IMPLANT
TOWEL OR 17X24 6PK STRL BLUE (TOWEL DISPOSABLE) ×2 IMPLANT
WAND 90 DEG TURBOVAC W/CORD (SURGICAL WAND) IMPLANT
WATER STERILE IRR 500ML POUR (IV SOLUTION) ×2 IMPLANT

## 2015-03-13 NOTE — H&P (View-Only) (Signed)
Isaiah Dunn is an 59 y.o. male.   Chief Complaint: R knee pain HPI: The patient is a 58 year old male who presents with knee complaints. The patient is seen today for a surgical consult and in referral from Dr. Drema Dallas. The patient reports right knee symptoms which began 6 month(s) ago without any known injury. The patient describes their pain as aching. Prior to being seen today the patient was previously evaluated by by Dr. Drema Dallas. Previous work-up for this problem has included knee x-rays and knee MRI. Past treatment for this problem has included application of ice, knee brace, intra-articular injection of corticosteroids, nonsteroidal anti-inflammatory drugs and non-opioid analgesics. Symptoms are reported to be located in the right knee and include medial knee pain, swelling, decreased range of motion, locking and instability. The patient reports that symptoms radiate to the right lower leg. Onset of symptoms was with symptoms now occurring constantly. Symptoms are exacerbated by weight bearing. Current treatment includes application of ice, nonsteroidal anti-inflammatory drugs and non-opioid analgesics. Risk factors include arthroscopic surgery (x3 by Dr. Shellia Carwin).  Past Medical History  Diagnosis Date  . Hyperlipidemia   . Hypertension   . Essential tremor     BENIGN--   . OA (osteoarthritis)   . Diverticulosis   . Diastolic dysfunction, left ventricle     GRADE 2  . Left ureteral calculus   . Urgency of urination   . Diabetes mellitus without complication     Past Surgical History  Procedure Laterality Date  . Vasectomy reversal  1993  . Knee arthroscopy w/ debridement Right X3  LAST ONE 12-31-2008  . Anterior cervical decomp/discectomy fusion  04-24-2008    C5  -- C6  . Shoulder arthroscopy Right X2  LAST ONE 04-25-2006  . Shoulder arthroscopy with rotator cuff repair and subacromial decompression Left 05-20-2010    AND DEBRIDEMENT BICEP TENDON/ OPEN DISTAL CLAVICAL  RESECTON  . Tonsillectomy  AGE 2  . Cardiac catheterization  06-21-2010  DR [facility]    RCA 25%/ MINIMAL DISEASE LM, CX  . Cardiovascular stress test  06-10-2010    NORMAL NUCLEAR STUDY BUT  CLINICALL ABNORMAL (CHEST PAIN, ST ABNORMALITY, HYPOTENSION)/   EF 61%  . Transthoracic echocardiogram  07-21-2010    GRADE II DIASTOLIC DYSFUNCTION/  EF 55%/  MILD DILATED AORTIC ROOT  . Cystoscopy with retrograde pyelogram, ureteroscopy and stent placement Left 10/30/2013    Procedure: CYSTOSCOPY WITH RETROGRADE PYELOGRAM, URETEROSCOPY AND STENT PLACEMENT;  Surgeon: Molli Hazard, MD;  Location: [facility];  Service: Urology;  Laterality: Left;  . Holmium laser application Left 70/05/2375    Procedure: HOLMIUM LASER APPLICATION;  Surgeon: Molli Hazard, MD;  Location: [facility];  Service: Urology;  Laterality: Left;    Family History  Problem Relation Age of Onset  . Arthritis Mother   . Hypertension Mother   . Hyperlipidemia Mother   . Hypertension Father   . Hyperlipidemia Father    Social History:  reports that he quit smoking about 14 years ago. His smoking use included Cigarettes. He has a 20 pack-year smoking history. He has never used smokeless tobacco. He reports that he drinks about 1.0 oz of alcohol per week. He reports that he does not use illicit drugs.  Allergies:  Allergies  Allergen Reactions  . Celebrex [Celecoxib] Other (See Comments)    GI UPSET  . Clindamycin Hcl Other (See Comments)    GI UPSET  . Codeine Other (See Comments)  GI UPSET  . Erythromycin Other (See Comments)    GI UPSET     (Not in a hospital admission)  No results found for this or any previous visit (from the past 48 hour(s)). No results found.  Review of Systems  Constitutional: Negative.   HENT: Negative.   Eyes: Negative.   Respiratory: Negative.   Cardiovascular: Negative.   Gastrointestinal: Negative.   Genitourinary: Negative.    Musculoskeletal: Positive for joint pain.  Skin: Negative.   Neurological: Negative.   Psychiatric/Behavioral: Negative.     There were no vitals taken for this visit. Physical Exam  Constitutional: He is oriented to person, place, and time. He appears well-developed and well-nourished.  HENT:  Head: Normocephalic and atraumatic.  Eyes: Conjunctivae and EOM are normal. Pupils are equal, round, and reactive to light.  Neck: Normal range of motion. Neck supple.  Cardiovascular: Normal rate.   Respiratory: Effort normal and breath sounds normal.  GI: Soft. Bowel sounds are normal.  Musculoskeletal:  On exam he is tender along the medial joint line. Positive McMurray.  Knee exam on inspection reveals no evidence of soft tissue swelling, ecchymosis, deformity, or erythema. On palpation there is no tenderness in the lateral joint line. No patellofemoral pain with compression. Nontender over the fibular head of the peroneal nerve. Nontender over the quadriceps insertion of the patellar ligament insertion. The range of motion was full. Provocative maneuvers revealed a negative Lachman, negative anterior and posterior drawer. No instability was noted with varus and valgus stressing at 0 or 30 degrees. On manual motor test the quadriceps and hamstrings were 5/5. Sensory exam was intact to light touch.  Neurological: He is alert and oriented to person, place, and time. He has normal reflexes.  Skin: Skin is warm and dry.  Psychiatric: He has a normal mood and affect.    AP standing and lateral demonstrates moderate medial joint space narrowing on the right and some patellofemoral arthrosis.  MRI of the knee demonstrates meniscus tear, moderate loss of the medial compartment, and the patellofemoral joint.  Assessment/Plan R knee MMT  1. Symptomatic medial meniscal tear right knee refractory. 2. Symptomatic moderate chondromalacia patellofemoral joint medial compartment, possible chondral  flap tear.  We discussed options at this point in time. He has exhausted conservative treatment. Discussed repeating the knee arthroscopy.  I had a long discussion with the patient concerning the risks and benefits of knee arthroscopy including help from the arthroscopic procedure as well as no help from the arthroscopic procedure or worsening of symptoms. Also discussed infection, DVT, PE, anesthetic complications, etc. Also discussed the possibility of repeat arthroscopic surgery required in the future or total knee replacement. I provided the patient with an illustrated handout and discussed it in detail as well as discussed the postoperative and perioperative courses and return to functional activities including work. Need for postoperative DVT prophylaxis was discussed as well.  Indicated to Mr. Caba, he goes by Isaiah Dunn, that at some point, arthroscopic debridement, partial meniscectomy, evaluate the chondral surfaces. He may require Visco supplementation and ultimately have restrictions requiring total knee replacement in the future. The patient uses an upright brace for walking on uneven surfaces, maintain good arch support, quad strengthening. No history of DVT. We will have him undergo preoperative decolonization and a prescription was given. He was a soccer player in the past. He has had three scopes in the past by Dr. Shellia Carwin. I appreciate the kind referral by Dr. Drema Dallas.  I secured his outpatient notes.  He did have an episode of urosepsis in 2014, Enterococcus. He was admitted and given Cipro, IV Vancomycin and Ziprazidone. He was discharged on Augmentin.   Plan R knee arthroscopy, partial medial meniscectomy, debridement  Divina Neale M. PA-C for Dr. Tonita Cong 03/05/2015, 5:29 PM

## 2015-03-13 NOTE — Brief Op Note (Signed)
03/13/2015  3:06 PM  PATIENT:  Casimiro Needle  59 y.o. male  PRE-OPERATIVE DIAGNOSIS:  medial meniscal tear right knee  POST-OPERATIVE DIAGNOSIS:  medial meniscal tear right knee  PROCEDURE:  Procedure(s): RIGHT KNEE ARTHROSCOPY WITH PARTIAL MEDIAL MENISECTOMY AND CHONDROPLASTY  (Right)  SURGEON:  Surgeon(s) and Role:    * Susa Day, MD - Primary  PHYSICIAN ASSISTANT:   ASSISTANTS: Bissell   ANESTHESIA:   general  EBL:  Total I/O In: 1000 [I.V.:1000] Out: -   BLOOD ADMINISTERED:none  DRAINS: none   LOCAL MEDICATIONS USED:  MARCAINE     SPECIMEN:  No Specimen  DISPOSITION OF SPECIMEN:  N/A  COUNTS:  YES  TOURNIQUET:  * No tourniquets in log *  DICTATION: .Other Dictation: Dictation Number [id number]  PLAN OF CARE: Discharge to home after PACU  PATIENT DISPOSITION:  PACU - hemodynamically stable.   Delay start of Pharmacological VTE agent (>24hrs) due to surgical blood loss or risk of bleeding: no

## 2015-03-13 NOTE — Anesthesia Procedure Notes (Signed)
Procedure Name: LMA Insertion Date/Time: 03/13/2015 2:30 PM Performed by: Mechele Claude Pre-anesthesia Checklist: Patient identified, Emergency Drugs available, Suction available and Patient being monitored Patient Re-evaluated:Patient Re-evaluated prior to inductionOxygen Delivery Method: Circle System Utilized Preoxygenation: Pre-oxygenation with 100% oxygen Intubation Type: IV induction Ventilation: Mask ventilation without difficulty LMA: LMA inserted LMA Size: 5.0 Number of attempts: 1 Airway Equipment and Method: bite block Placement Confirmation: positive ETCO2 Tube secured with: Tape Dental Injury: Teeth and Oropharynx as per pre-operative assessment

## 2015-03-13 NOTE — Transfer of Care (Signed)
  Last Vitals:  Filed Vitals:   03/13/15 1240  BP: 136/83  Pulse: 62  Temp: 36.4 C  Resp: 16   Immediate Anesthesia Transfer of Care Note  Patient: Isaiah Dunn  Procedure(s) Performed: Procedure(s) (LRB): RIGHT KNEE ARTHROSCOPY WITH PARTIAL MEDIAL MENISECTOMY AND CHONDROPLASTY  (Right)  Patient Location: PACU  Anesthesia Type: General  Level of Consciousness: awake, alert  and oriented  Airway & Oxygen Therapy: Patient Spontanous Breathing and Patient connected to nasal cannula oxygen  Post-op Assessment: Report given to PACU RN and Post -op Vital signs reviewed and stable  Post vital signs: Reviewed and stable  Complications: No apparent anesthesia complications

## 2015-03-13 NOTE — Anesthesia Postprocedure Evaluation (Signed)
  Anesthesia Post-op Note  Patient: Isaiah Dunn  Procedure(s) Performed: Procedure(s) (LRB): RIGHT KNEE ARTHROSCOPY WITH PARTIAL MEDIAL MENISECTOMY AND CHONDROPLASTY  (Right)  Patient Location: PACU  Anesthesia Type: General  Level of Consciousness: awake and alert   Airway and Oxygen Therapy: Patient Spontanous Breathing  Post-op Pain: mild  Post-op Assessment: Post-op Vital signs reviewed, Patient's Cardiovascular Status Stable, Respiratory Function Stable, Patent Airway and No signs of Nausea or vomiting  Last Vitals:  Filed Vitals:   03/13/15 1530  BP: 114/66  Pulse: 79  Temp:   Resp: 15    Post-op Vital Signs: stable   Complications: No apparent anesthesia complications

## 2015-03-13 NOTE — Anesthesia Preprocedure Evaluation (Signed)
Anesthesia Evaluation  Patient identified by MRN, date of birth, ID band Patient awake    Reviewed: Allergy & Precautions, H&P , NPO status , Patient's Chart, lab work & pertinent test results, reviewed documented beta blocker date and time   Airway Mallampati: II  TM Distance: >3 FB Neck ROM: full    Dental no notable dental hx. (+) Teeth Intact, Dental Advisory Given   Pulmonary neg pulmonary ROS, former smoker,  breath sounds clear to auscultation  Pulmonary exam normal       Cardiovascular Exercise Tolerance: Good hypertension, Pt. on medications Rhythm:regular Rate:Normal  Diastolic dysfunction   Neuro/Psych Essential tremor negative neurological ROS  negative psych ROS   GI/Hepatic negative GI ROS, Neg liver ROS,   Endo/Other  diabetes, Well Controlled, Type 2, Oral Hypoglycemic Agents  Renal/GU negative Renal ROS  negative genitourinary   Musculoskeletal   Abdominal   Peds  Hematology negative hematology ROS (+)   Anesthesia Other Findings   Reproductive/Obstetrics negative OB ROS                             Anesthesia Physical  Anesthesia Plan  ASA: III  Anesthesia Plan: General   Post-op Pain Management:    Induction: Intravenous  Airway Management Planned: LMA  Additional Equipment:   Intra-op Plan:   Post-operative Plan:   Informed Consent: I have reviewed the patients History and Physical, chart, labs and discussed the procedure including the risks, benefits and alternatives for the proposed anesthesia with the patient or authorized representative who has indicated his/her understanding and acceptance.   Dental Advisory Given  Plan Discussed with: CRNA and Surgeon  Anesthesia Plan Comments:         Anesthesia Quick Evaluation

## 2015-03-13 NOTE — Discharge Instructions (Signed)
ARTHROSCOPIC KNEE SURGERY HOME CARE INSTRUCTIONS   PAIN You will be expected to have a moderate amount of pain in the affected knee for approximately two weeks.  However, the first two to four days will be the most severe in terms of the pain you will experience.  Prescriptions have been provided for you to take as needed for the pain.  The pain can be markedly reduced by using the ice/compressive bandage given.  Exchange the ice packs whenever they thaw.  During the night, keep the bandage on because it will still provide some compression for the swelling.  Also, keep the leg elevated on pillows above your heart, and this will help alleviate the pain and swelling.  MEDICATION Prescriptions have been provided to take as needed for pain. To prevent blood clots, take Aspirin 325mg  daily with a meal if not on a blood thinner and if no history of stomach ulcers.  ACTIVITY It is preferred that you stay on bedrest for approximately 24 hours.  However, you may go to the bathroom with help.  After this, you can start to be up and about progressively more.  Remember that the swelling may still increase after three to four days if you are up and doing too much.  You may put as much weight on the affected leg as pain will allow.  Use your crutches for comfort and safety.  However, as soon as you are able, you may discard the crutches and go without them.   DRESSING Keep the current dressing as dry as possible.  Two days after your surgery, you may remove the ice/compressive wrap, and surgical dressing.  You may now take a shower, but do not scrub the sounds directly with soap.  Let water rinse over these and gently wipe with your hand.  Reapply band-aids over the puncture wounds and more gauze if needed.  A slight amount of thin drainage can be normal at this time, and do not let it frighten you.  Reapply the ice/compressive wrap.  You may now repeat this every day each time you shower.  SYMPTOMS TO REPORT TO  YOUR DOCTOR  -Extreme pain.  -Extreme swelling.  -Temperature above 101 degrees that does not come down with acetaminophen     (Tylenol).  -Any changes in the feeling, color or movement of your toes.  -Extreme redness, heat, swelling or drainage at your incision  EXERCISE It is preferred that you begin to exercise on the day of your surgery.  Straight leg raises and short arc quads should be begun the afternoon or evening of surgery and continued until you come back for your follow-up appointment.   Attached is an instruction sheet on how to perform these two simple exercises.  Do these at least three times per day if not more.  You may bend your knee as much as is comfortable.  The puncture wounds may occasionally be slightly uncomfortable with bending of the knee.  Do not let this frighten you.  It is important to keep your knee motion, but do not overdo it.  If you have significant pain, simply do not bend the knee as far.   You will be given more exercises to perform at your first return visit.    RETURN APPOINTMENT Please make an appointment to be seen by your doctor in 10-14 Chondral Injury Localized injuries to the surface of joints are known as chondral injuries. In chondral injuries, the articular cartilage sustains an injury. This may or  may not involve the separation of the articular cartilage from the bone. Chondral injuries do not involve an injury to the underlying bone. These injuries can occur in any joint, although the knee joint is the most commonly injured, followed by the ankle, elbow, and shoulder. Chondral injuries occur most frequently in adolescent males. Chondral injuries are difficult to treat because cartilage has a limited ability to heal.  SYMPTOMS   Pain, swelling, or pain or tenderness in the affected joint.  Giving way, locking, or catching of the joint.  Feeling of a free-floating piece of cartilage in the joint.  A crackling sound (crepitation) within the joint  with motion.  Often, injuries to other structures within the joint (ligament tears or meniscus injury). CAUSES  Direct trauma (impaction, avulsion, or shearing or rotational forces) to the joint.  RISK INCREASES WITH:  Participation in contact sports or sports in which playing on, and falling on, hard surfaces may occur.  Adolescence.  Other knee injury (anterior cruciate ligament [ACL] or meniscus tear).  Poor physical strength and flexibility of the joint. PREVENTION  Wear protective equipment (knee, elbow, or shoulder pads) to soften direct trauma.  Wear appropriate-length footwear (cleats for field sports) for the playing surface.  Warm up and stretch properly before physical activity.  Maintain appropriate physical fitness:  Flexibility, strength, and endurance of muscles around joints.  Cardiovascular fitness. PROGNOSIS  The prognosis of chondral injuries is dependent on the size of the lesion (injury). Small chondral lesions may not cause problems. Large and/or deep chondral lesions are more problematic because cartilage does not have the ability to heal. Chondral injuries may go on to develop arthritis of the joint. Usually the symptoms resolve with appropriate treatment, which can include removal of or fixing loose pieces of cartilage.  RELATED COMPLICATIONS   Frequent recurrence of symptoms, resulting in chronic pain and swelling.  Arthritis of the affected joint.  Loose bodies that cause locking of affected joint. TREATMENT  Treatment initially consists of taking medication and applying ice to reduce pain and inflammation of the affected joint. For injuries to the joints of the leg (knee or ankle), walking with crutches (still allow weight to be placed on the leg) until you walk without a limp is often recommended. You may be asked to perform range-of-motion, stretching, and strengthening exercises. These exercises may be carried out at home, although you may be given  a referral to a therapist. Occasionally it may be recommend that you wear a brace, cast, or crutches (for the knee or ankle) to protect or immobilize the joint. If pain persists despite conservative treatment or if loose fragments are present within the joint, surgery is usually recommended. The surgery is typically performed arthroscopically to remove the loose fragments or to re-attach the fragment (if large enough and not deformed). After immobilization or surgery it is important to stretch and strengthen the weakened joint and surrounding muscles (due to the injury, surgery, or the immobilization). This may be done with or without the assistance of a therapist. MEDICATION   If pain medication is necessary, nonsteroidal anti-inflammatory medications, such as aspirin and ibuprofen, or other minor pain relievers, such as acetaminophen, are often recommended.  Do not take pain medication for 7 days before surgery.  Prescription pain relievers are usually only prescribed after surgery. Use only as directed and only as much as you need. HEAT AND COLD  Cold treatment (icing) relieves pain and reduces inflammation. Cold treatment should be applied for 10 to 15  minutes every 2 to 3 hours for inflammation and pain and immediately after any activity that aggravates your symptoms. Use ice packs or an ice massage.  Heat treatment may be used prior to performing the stretching and strengthening activities prescribed by your caregiver, physical therapist, or athletic trainer. Use a heat pack or a warm soak. SEEK MEDICAL CARE IF:   Symptoms worsen or do not improve in 2 weeks despite treatment.  Any of the following occur after surgery:  Signs of infection: fever, increased pain, swelling, redness, drainage, or bleeding in the surgical area.  Pain, numbness, or coldness in the foot or hand.  Blue, gray, or dark color appears in the toenails or fingernails.  New, unexplained symptoms develop (this may be  due to side affects of the drugs used in treatment). Document Released: 12/12/2005 Document Revised: 04/28/2014 Document Reviewed: 03/26/2009 [facility] Patient Information 2015 [place], [place]. This information is not intended to replace advice given to you by your health care provider. Make sure you discuss any questions you have with your health care provider.  days from your surgery.  Patient Signature:  ________________________________________________________  Nurse's Signature:  ________________________________________________________   Post Anesthesia Home Care Instructions  Activity: Get plenty of rest for the remainder of the day. A responsible adult should stay with you for 24 hours following the procedure.  For the next 24 hours, DO NOT: -Drive a car -Paediatric nurse -Drink alcoholic beverages -Take any medication unless instructed by your physician -Make any legal decisions or sign important papers.  Meals: Start with liquid foods such as gelatin or soup. Progress to regular foods as tolerated. Avoid greasy, spicy, heavy foods. If nausea and/or vomiting occur, drink only clear liquids until the nausea and/or vomiting subsides. Call your physician if vomiting continues.  Special Instructions/Symptoms: Your throat may feel dry or sore from the anesthesia or the breathing tube placed in your throat during surgery. If this causes discomfort, gargle with warm salt water. The discomfort should disappear within 24 hours.

## 2015-03-13 NOTE — Interval H&P Note (Signed)
History and Physical Interval Note:  03/13/2015 2:21 PM  Isaiah Dunn  has presented today for surgery, with the diagnosis of medial meniscal tear right knee  The various methods of treatment have been discussed with the patient and family. After consideration of risks, benefits and other options for treatment, the patient has consented to  Procedure(s): RIGHT KNEE ARTHROSCOPY WITH PARTIAL MEDIAL MENISECTOMY AND DEBRIDEMENT (Right) as a surgical intervention .  The patient's history has been reviewed, patient examined, no change in status, stable for surgery.  I have reviewed the patient's chart and labs.  Questions were answered to the patient's satisfaction.     Arelis Neumeier,Jailyn C

## 2015-03-16 ENCOUNTER — Encounter (HOSPITAL_BASED_OUTPATIENT_CLINIC_OR_DEPARTMENT_OTHER): Payer: Self-pay | Admitting: Specialist

## 2015-03-16 ENCOUNTER — Encounter: Payer: Self-pay | Admitting: Family Medicine

## 2015-03-16 NOTE — Op Note (Signed)
NAMETREYLEN, GIBBS NO.:  [id number]  MEDICAL RECORD NO.:  [id number]  LOCATION:                                 FACILITY:  PHYSICIAN:  Susa Day, M.D.    DATE OF BIRTH:  [birth year]  DATE OF PROCEDURE:  03/13/2015 DATE OF DISCHARGE:  03/13/2015                              OPERATIVE REPORT   PREOPERATIVE DIAGNOSIS:  Medial meniscus tear, degenerative joint disease of the right knee.  POSTOPERATIVE DIAGNOSES: 1. Grade 3 chondromalacia of the patella. 2. Medial meniscus tear. 3. Hypertrophic synovitis.  PROCEDURES PERFORMED: 1. Right knee arthroscopy. 2. Partial medial meniscectomy. 3. Chondroplasty of the femoral sulcus. 4. Debridement of hypertrophic synovium, intercondylar notch.  ANESTHESIA:  General.  SURGEON:  Susa Day, M.D.  ASSISTANT:  Cleophas Dunker, PA.  HISTORY:  This is a pleasant gentleman, 59, locking, popping, and giving way of the knee, indicated for knee arthroscopy and debridement.  Risks and benefits discussed including bleeding, infection, damage to neurovascular structures, DVT, PE, anesthetic complications, etc.  TECHNIQUE:  With the patient in supine position, after induction of adequate general anesthesia, 2 g Kefzol, right lower extremity was prepped and draped in usual sterile fashion.  A lateral parapatellar portal was fashioned with a #11 blade.  Ingress cannula atraumatically placed.  Irrigant was utilized to insufflate the joint.  Under direct visualization, a medial parapatellar portal was fashioned with a #11 blade after localization with an 18-gauge needle, sparing the medial meniscus.  Noted was a grade 3 lesion in the medial femoral condyle, tear of the posterior horn, posterior third of the medial meniscus.  I introduced a basket and resected it to a stable base, further contoured with 3.5 Cuda shaver.  Light chondroplasty performed of the femoral condyle.  The remnant of the meniscus was stable to  probe palpation. There was a previous partial meniscectomy.  Approximately 50% of posterior third had been excised.  We excised an additional 15%. Intercondylar notch revealed hypertrophic synovitis.  This was debrided with 3.5 shaver.  ACL was intact.  Lateral compartment fairly tight.  We asked for additional intraoperative muscle relaxants to gain access to the lateral compartment in the figure-of-four position with assistant, there was normal femoral condyle, tibial plateau, and meniscus stable to probe palpation.  We checked posteriorly.  Next, examined the suprapatellar pouch, she had some grade 3 changes of the patella, normal patellofemoral tracking.  There was a large significant chondral lesion in the central portion of the femoral sulcus.  It was not a grade 4, however, it was a grade 3-1/2.  There was chondral flap tear noted.  I resected the periphery of that to a stable base with a basket rongeur, further contoured with 3.5 Cuda shaver.  Light chondroplasty performed of the patella gutters were unremarkable.  Revisited all compartments, no further pathology amenable to arthroscopic intervention.  I, therefore, removed all instrumentation.  Portals were closed with 4-0 nylon simple sutures.  Marcaine 0.25% with epinephrine was infiltrated in the joint.  Wound was dressed sterilely, awoken without difficulty, and transported to the recovery room in satisfactory condition.  The patient tolerated the procedure well.  No complications.  Minimal blood loss at  the beginning.     Susa Day, M.D.     Geralynn Rile  D:  03/13/2015  T:  03/14/2015  Job:  [id number]

## 2015-03-17 ENCOUNTER — Encounter: Payer: Self-pay | Admitting: Family Medicine

## 2015-03-17 DIAGNOSIS — H33319 Horseshoe tear of retina without detachment, unspecified eye: Secondary | ICD-10-CM | POA: Insufficient documentation

## 2015-04-03 ENCOUNTER — Other Ambulatory Visit: Payer: Self-pay | Admitting: Family Medicine

## 2015-05-03 ENCOUNTER — Other Ambulatory Visit: Payer: Self-pay | Admitting: Family Medicine

## 2015-06-17 ENCOUNTER — Encounter: Payer: Self-pay | Admitting: Family Medicine

## 2015-06-23 IMAGING — CR DG ABDOMEN ACUTE W/ 1V CHEST
4 series · 4 of 4 positions shown · non-contrast
Comparison: 09/17/2013

CLINICAL DATA: Left-sided abdominal pain.

EXAM:
ACUTE ABDOMEN SERIES (ABDOMEN 2 VIEW & CHEST 1 VIEW)

[w chest pa]
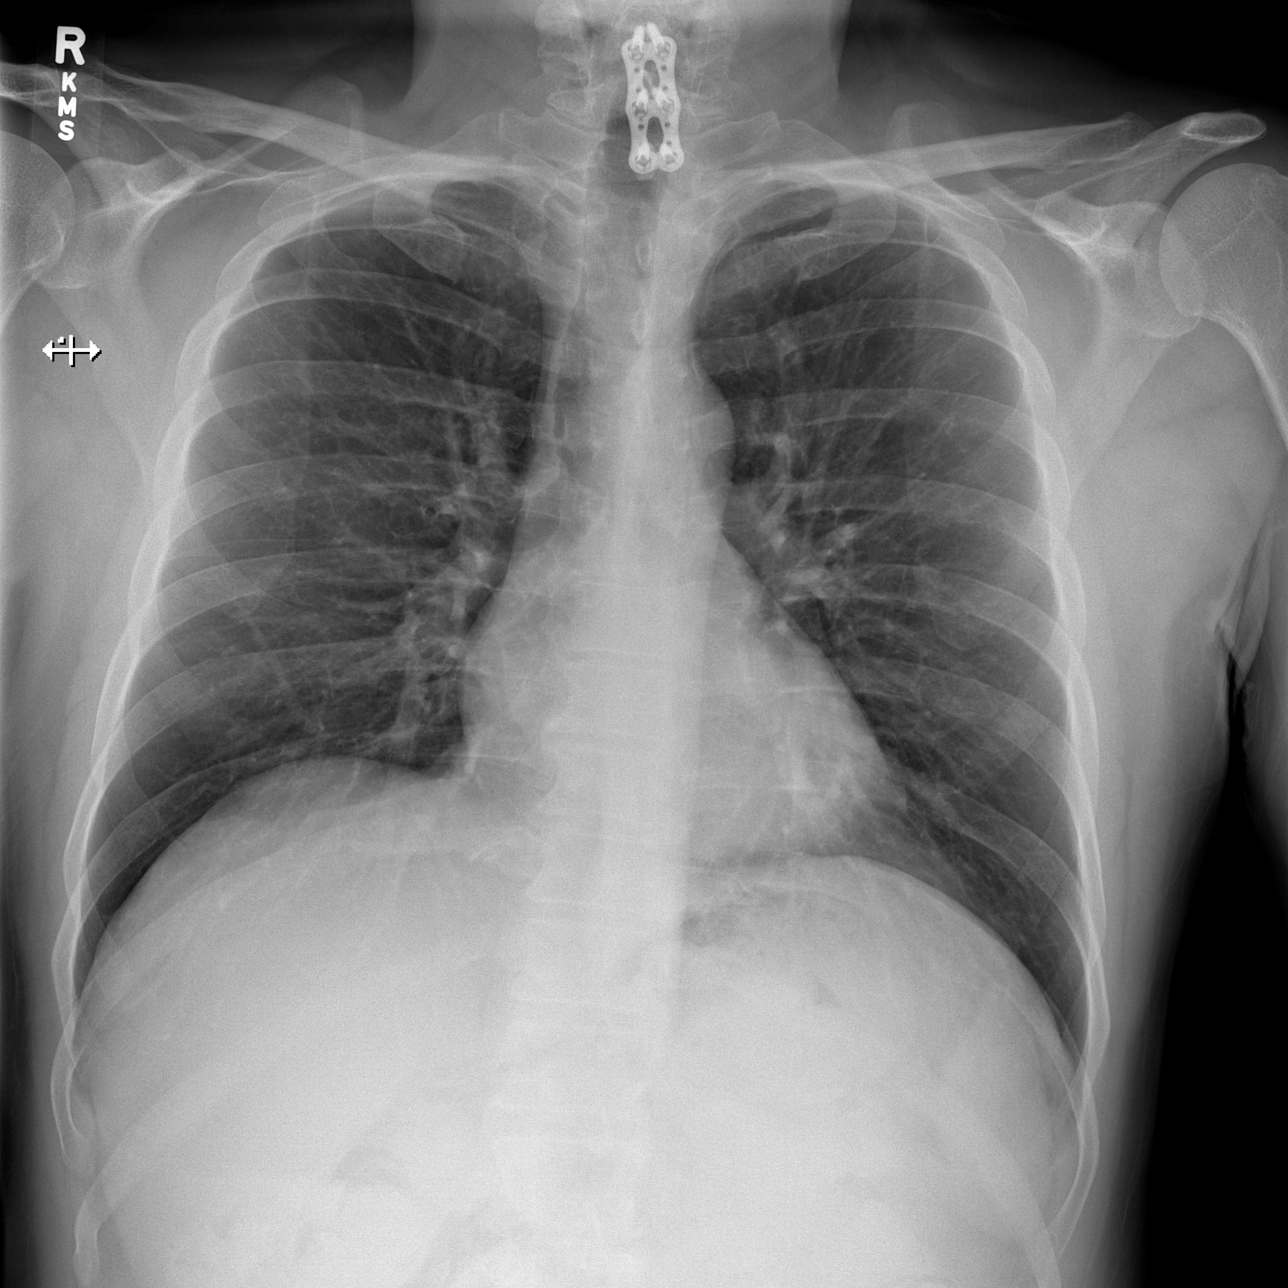

[w abdomen upright]
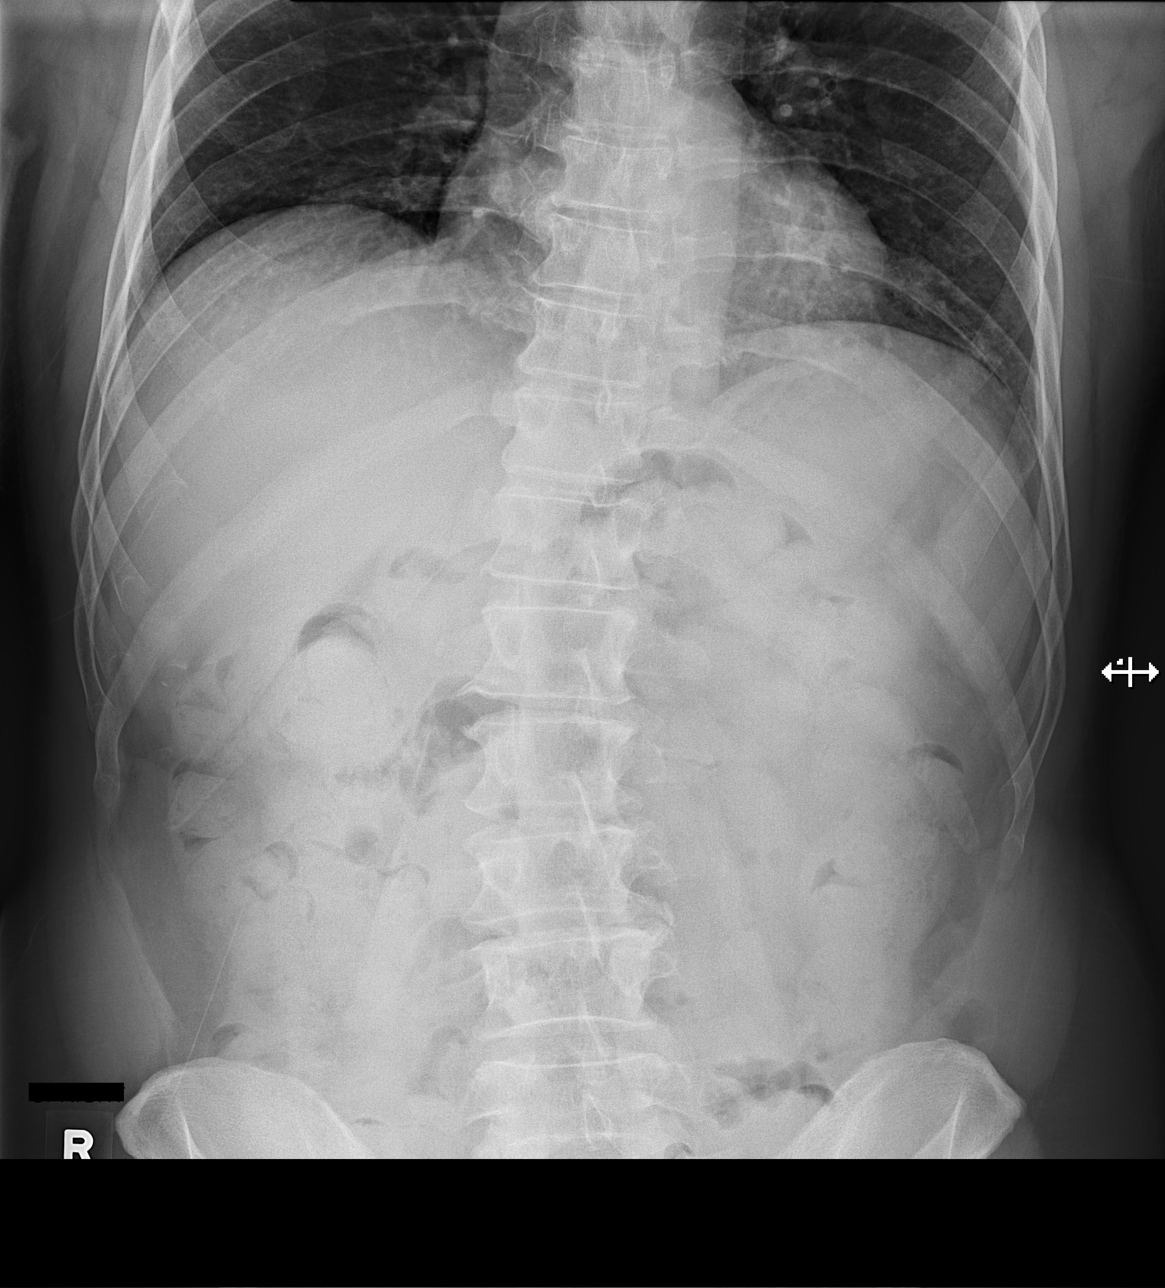

[t abdomen supine (1 of 2)]
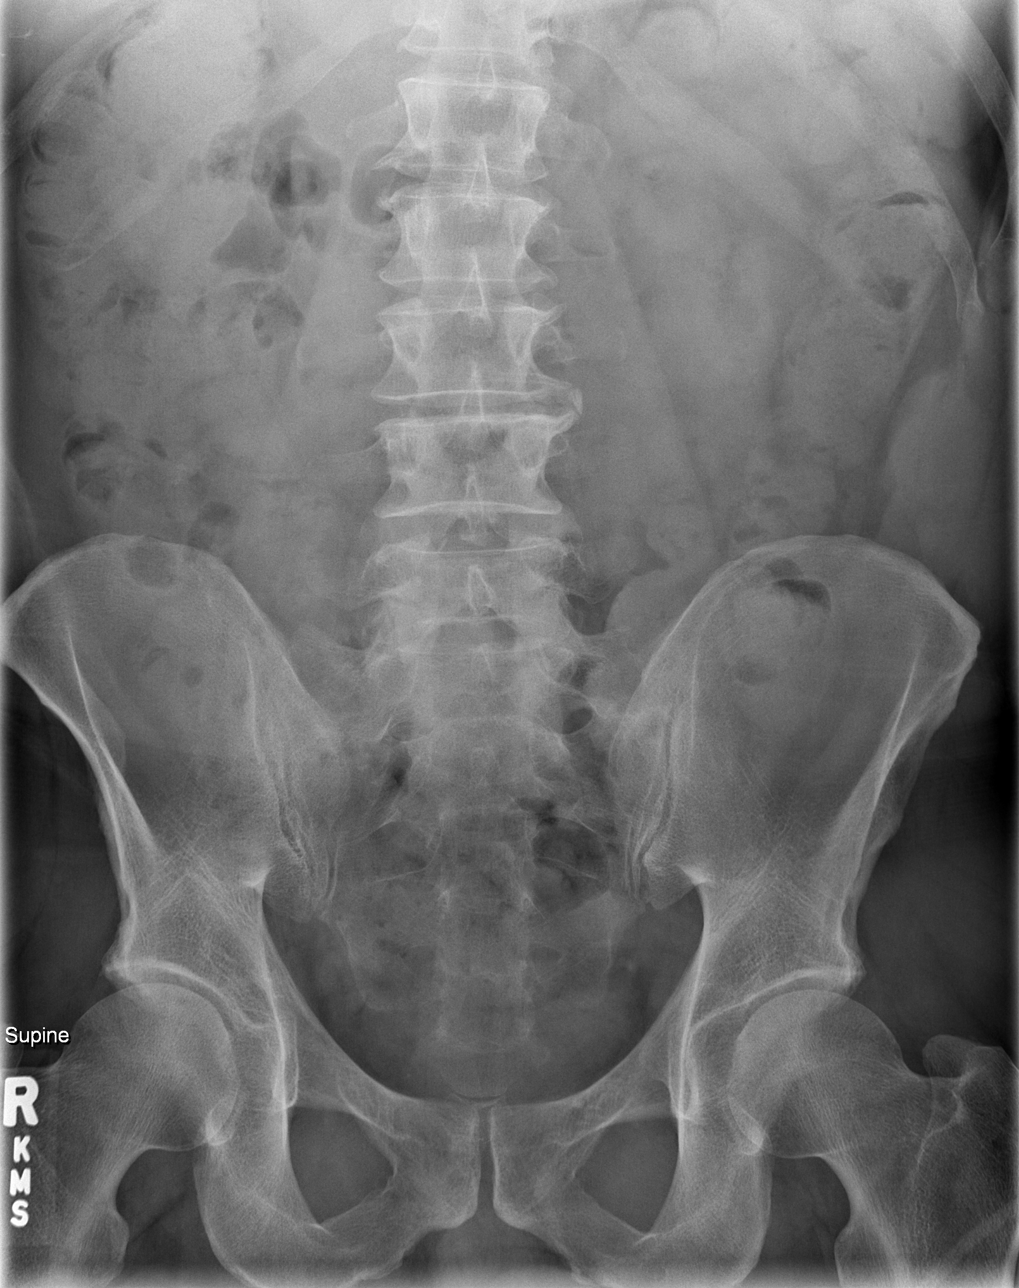

[t abdomen supine (2 of 2)]
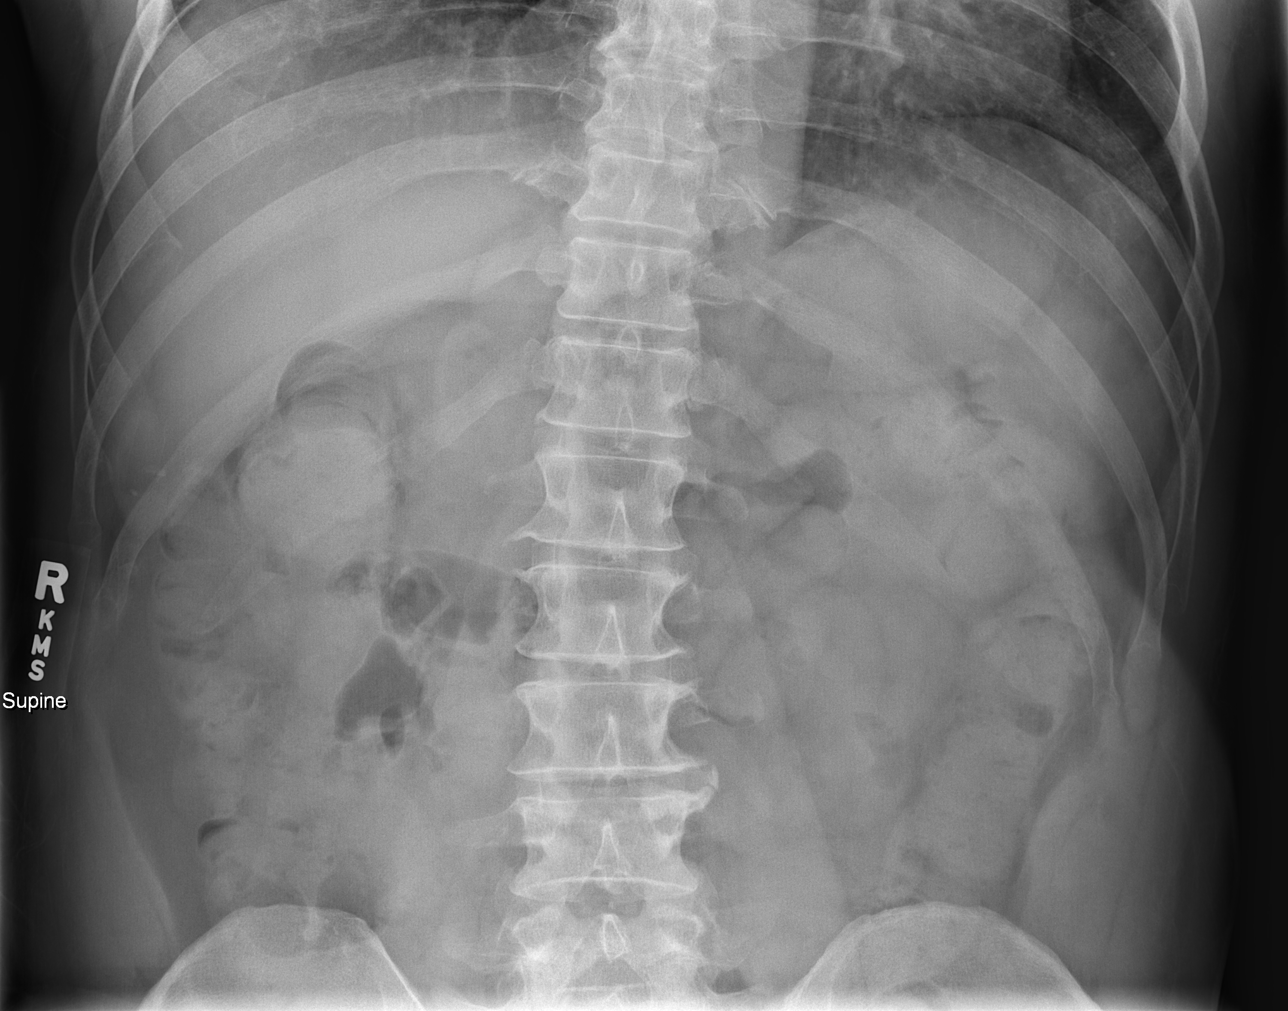

[4 of 4 positions shown; findings below may reference images not displayed]

FINDINGS: There is no evidence of dilated bowel loops or free intraperitoneal
air. Large volume formed stool throughout the colon. No radiopaque
calculi (known left nephrolithiasis not evident) or other
significant radiographic abnormality is seen. Heart size and
mediastinal contours are within normal limits. Both lungs are clear.
IMPRESSION: Large volume stool, without obstruction.

## 2015-06-29 ENCOUNTER — Other Ambulatory Visit: Payer: Self-pay | Admitting: Family Medicine

## 2015-07-30 ENCOUNTER — Other Ambulatory Visit: Payer: Self-pay | Admitting: Family Medicine

## 2015-09-03 ENCOUNTER — Other Ambulatory Visit: Payer: Self-pay | Admitting: Family Medicine

## 2015-10-03 ENCOUNTER — Other Ambulatory Visit: Payer: Self-pay | Admitting: Family Medicine

## 2015-10-05 NOTE — Telephone Encounter (Signed)
Refill ok? 

## 2015-10-05 NOTE — Telephone Encounter (Signed)
Yes thanks 

## 2015-12-02 ENCOUNTER — Other Ambulatory Visit: Payer: Self-pay

## 2015-12-02 ENCOUNTER — Encounter: Payer: Self-pay | Admitting: Family Medicine

## 2015-12-02 MED ORDER — DULOXETINE HCL 30 MG PO CPEP: 30.0000 mg | ORAL_CAPSULE | Freq: Two times a day (BID) | ORAL | Status: DC

## 2015-12-15 ENCOUNTER — Other Ambulatory Visit: Payer: Self-pay | Admitting: Family Medicine

## 2016-01-01 ENCOUNTER — Ambulatory Visit (INDEPENDENT_AMBULATORY_CARE_PROVIDER_SITE_OTHER): Payer: 59 | Admitting: Family Medicine

## 2016-01-01 ENCOUNTER — Encounter: Payer: Self-pay | Admitting: Family Medicine

## 2016-01-01 VITALS — BP 120/82 | HR 74 | Temp 97.5°F | Ht 69.0 in | Wt 208.0 lb

## 2016-01-01 DIAGNOSIS — G8929 Other chronic pain: Secondary | ICD-10-CM

## 2016-01-01 DIAGNOSIS — R1032 Left lower quadrant pain: Secondary | ICD-10-CM | POA: Diagnosis not present

## 2016-01-01 DIAGNOSIS — R1013 Epigastric pain: Secondary | ICD-10-CM

## 2016-01-01 DIAGNOSIS — E119 Type 2 diabetes mellitus without complications: Secondary | ICD-10-CM

## 2016-01-01 DIAGNOSIS — R079 Chest pain, unspecified: Secondary | ICD-10-CM | POA: Diagnosis not present

## 2016-01-01 DIAGNOSIS — Z0001 Encounter for general adult medical examination with abnormal findings: Secondary | ICD-10-CM

## 2016-01-01 DIAGNOSIS — Z23 Encounter for immunization: Secondary | ICD-10-CM | POA: Diagnosis not present

## 2016-01-01 DIAGNOSIS — Z0000 Encounter for general adult medical examination without abnormal findings: Secondary | ICD-10-CM

## 2016-01-01 NOTE — Assessment & Plan Note (Signed)
S: previously controlled with Metformin 1000mg  once daily with breakfast, benazepril to protect kidneys. a1c was trending up at last visit to 6.9 from 6.5. Patient has not returned to care in nearly a year Lab Results  Component Value Date   HGBA1C 6.9* 01/07/2015  A/P: check a1c today , updated foot exam and has eye exam in February. Hopeful 7.5 or less at least.

## 2016-01-01 NOTE — Patient Instructions (Addendum)
Flu shot received today.  Have copy of eye exam faxed to [place] at [phone]. [facility] OPHTHALMOLOGY EXAM - encouraged to schedule and fax [place] results   Schedule lab visit for fasting labs next week  We will call you about stress test and CT abdomen/pelvis. You will have to have bloodwork before the CT can be completed.   Let's reassess this in about 3-4 weeks or sooner if worsens. I want you to pick up prilosec/omeprazole and trial this daily in morning to see if this helps lessen your symptoms as I tihnk reflux is possible

## 2016-01-01 NOTE — Progress Notes (Signed)
Isaiah Reddish, MD Phone: [phone]  Subjective:  Patient presents today for their annual physical. Chief complaint-noted.   See problem oriented charting- ROS- full  review of systems was completed and negative except for as noted in HPI under chest pain/abdominal pain/suprapubic pain  The following were reviewed and entered/updated in epic: Past Medical History  Diagnosis Date  . Hyperlipidemia   . Hypertension   . Essential tremor     BENIGN--   . OA (osteoarthritis)   . Diverticulosis   . Diastolic dysfunction, left ventricle     GRADE 2  . History of kidney stones   . History of sepsis     SIRS post ureteroscopic stone extraction w/ stent placement 10-29-2013--  resolved  . Type 2 diabetes mellitus ([place])   . Right knee meniscal tear   . Retina disorder, left     LEFT EYE RETINAL BUBBLE (NO TEAR OR DETACHMENT)  PT IS SEEING DR [facility] 03-11-2015 TO HAVE LASERED--  PT SEE FLOATERS   Patient Active Problem List   Diagnosis Date Noted  . Diabetes mellitus type II, controlled ([place]) 05/11/2007    Priority: High  . Retinal tear 03/17/2015    Priority: Medium  . Depression 10/06/2014    Priority: Medium  . Anemia 10/06/2014    Priority: Medium  . Diastolic dysfunction [id number]    Priority: Medium  . TREMOR, ESSENTIAL 09/06/2007    Priority: Medium  . Hyperlipidemia 05/11/2007    Priority: Medium  . UTI (urinary tract infection) 11/11/2013    Priority: Low  . History of herpes zoster 07/17/2009    Priority: Low  . Osteoarthritis 04/16/2008    Priority: Low  . RUQ pain 01/07/2015   Past Surgical History  Procedure Laterality Date  . Vasectomy reversal  1993  . Knee arthroscopy w/ debridement Right X3  LAST ONE 12-31-2008  . Anterior cervical decomp/discectomy fusion  04-24-2008    C5  -- C6  . Shoulder arthroscopy Right X2  LAST ONE 04-25-2006  . Shoulder arthroscopy with rotator cuff repair and subacromial decompression Left 05-20-2010    AND DEBRIDEMENT  BICEP TENDON/ OPEN DISTAL CLAVICAL RESECTON  . Tonsillectomy  AGE 35  . Cardiac catheterization  06-21-2010  DR [facility]    RCA 25%/ MINIMAL DISEASE LM, CX  . Cardiovascular stress test  06-10-2010    NORMAL NUCLEAR STUDY BUT  CLINICALL ABNORMAL (CHEST PAIN, ST ABNORMALITY, HYPOTENSION)/   EF 61%  . Transthoracic echocardiogram  07-21-2010    GRADE II DIASTOLIC DYSFUNCTION/  EF 55%/  MILD DILATED AORTIC ROOT  . Cystoscopy with retrograde pyelogram, ureteroscopy and stent placement Left 10/30/2013    Procedure: CYSTOSCOPY WITH RETROGRADE PYELOGRAM, URETEROSCOPY AND STENT PLACEMENT;  Surgeon: Molli Hazard, MD;  Location: [facility];  Service: Urology;  Laterality: Left;  . Holmium laser application Left [id number]    Procedure: HOLMIUM LASER APPLICATION;  Surgeon: Molli Hazard, MD;  Location: [facility];  Service: Urology;  Laterality: Left;  . Retinal detachment surgery Left 03-11-2015  . Knee arthroscopy with medial menisectomy Right 03/13/2015    Procedure: RIGHT KNEE ARTHROSCOPY WITH PARTIAL MEDIAL MENISECTOMY AND CHONDROPLASTY ;  Surgeon: Susa Day, MD;  Location: [place];  Service: Orthopedics;  Laterality: Right;    Family History  Problem Relation Age of Onset  . Arthritis Mother   . Hypertension Mother   . Hyperlipidemia Mother   . Hypertension Father   . Hyperlipidemia Father  Medications- reviewed and updated Current Outpatient Prescriptions  Medication Sig Dispense Refill  . aspirin 81 MG chewable tablet Chew 81 mg by mouth daily.    Marland Kitchen atorvastatin (LIPITOR) 10 MG tablet Take 1 tablet (10 mg total) by mouth daily. (Patient taking differently: Take 10 mg by mouth every evening. ) 30 tablet 9  . benazepril (LOTENSIN) 5 MG tablet TAKE 1 TABLET BY MOUTH EVERY DAY 30 tablet 5  . DULoxetine (CYMBALTA) 30 MG capsule Take 1 capsule (30 mg total) by mouth 2 (two) times daily. 60 capsule 5  . metFORMIN  (GLUCOPHAGE-XR) 500 MG 24 hr tablet TAKE 2 TABLETS BY MOUTH EVERY DAY WITH BREAKFAST 60 tablet 5  . Multiple Vitamin (MULTIVITAMIN WITH MINERALS) TABS tablet Take 1 tablet by mouth once a week.     Marland Kitchen acetaminophen (TYLENOL) 325 MG tablet Take 650 mg by mouth every 6 (six) hours as needed. Reported on 01/01/2016     Allergies-reviewed and updated Allergies  Allergen Reactions  . Celebrex [Celecoxib] Other (See Comments)    GI UPSET  . Clindamycin Hcl Other (See Comments)    GI UPSET  . Erythromycin Other (See Comments)    GI UPSET    Social History   Social History  . Marital Status: Married    Spouse Name: N/A  . Number of Children: N/A  . Years of Education: N/A   Social History Main Topics  . Smoking status: Former Smoker -- 1.00 packs/day for 20 years    Types: Cigarettes    Quit date: 12/26/2000  . Smokeless tobacco: Never Used  . Alcohol Use: 1.8 oz/week    1 Cans of beer, 2 Standard drinks or equivalent per week     Comment: average one beer weekly  . Drug Use: No  . Sexual Activity: Not Asked   Other Topics Concern  . None   Social History Narrative   Wife has cancer (omentum- rare and tough to treat- will likely pass)   1 dog left      Regularly gives blood to [employer] cross as he thinks he may not be able to help wife, but he can help others. Has also given platelets.     ROS--See HPI   Objective: BP 120/82 mmHg  Pulse 74  Temp(Src) 97.5 F (36.4 C)  Ht 5\' 9"  (1.753 m)  Wt 208 lb (94.348 kg)  BMI 30.70 kg/m2 Gen: NAD, resting comfortably HEENT: Mucous membranes are moist. Oropharynx normal.  CV: RRR no murmurs rubs or gallops Lungs: CTAB no crackles, wheeze, rhonchi Abdomen: soft/moderate tenderness Epigastric and LUQ as well as down into LLQ and suprpubic on the left/nondistended/normal bowel sounds. No rebound or guarding.  Rectal: normal tone, normal size prostate, no masses or tenderness Ext: no edema Skin: warm, dry, no rash Neuro: grossly normal,  moves all extremities, PERRLA  Diabetic Foot Exam - Simple   Simple Foot Form  Diabetic Foot exam was performed with the following findings:  Yes 01/01/2016 11:02 AM  Visual Inspection  No deformities, no ulcerations, no other skin breakdown bilaterally:  Yes  Sensation Testing  Intact to touch and monofilament testing bilaterally:  Yes  Pulse Check  Posterior Tibialis and Dorsalis pulse intact bilaterally:  Yes  Comments     Assessment/Plan:  60 y.o. male presenting for annual physical.  Health Maintenance counseling: 1. Anticipatory guidance: Patient counseled regarding regular dental exams, eye exams, wearing seatbelts.  2. Risk factor reduction:  Advised patient of need for regular exercise (walks  dog daily) and diet rich and fruits and vegetables to reduce risk of heart attack and stroke.  Wt Readings from Last 3 Encounters:  01/01/16 208 lb (94.348 kg)  03/13/15 202 lb (91.627 kg)  01/07/15 203 lb (92.08 kg)  holiday eating- working on cutting back down on carbs but not to true paleo 3. Immunizations/screenings/ancillary studies- up to date with flu shot today Health Maintenance Due  Topic Date Due  . Hepatitis C Screening - regular blood to red cross 1956/02/02  . OPHTHALMOLOGY EXAM - encouraged to schedule and fax [place] results 02/03/1966  . HIV Screening - regular blood to [employer] cross 02/03/1971  . HEMOGLOBIN A1C - come back for this next week 07/08/2015  . FOOT EXAM - normal today 10/07/2015   4. Prostate cancer screening- check PSA as well as rectal- low risk rectal   Lab Results  Component Value Date   PSA 0.73 09/19/2014   PSA 0.42 05/10/2013   PSA 0.65 06/07/2012   5. Colon cancer screening - 04/17/09 with 10 year repeat  Chest pain, unspecified chest pain type - Plan: Myocardial Perfusion Imaging Abdominal pain, chronic, epigastric - Plan: Lipase, CT Abdomen Pelvis W Contrast Suprapubic abdominal pain, left - Plan: POCT urinalysis dipstick, Urine culture S:Tightness  in epigastric area and feels like he is itching underneath his skin. Pain has been going on for about a year but shifted from RUQ previously (normal u/s and labs at that time) now to epigastric and RUQ. Also has pain in his lower chest and over left lower ribs. Itching issue has been going on for several months. Sometimes pins ans needles in LLQ and left ribcage. Seems to be worse after eating. Bowel movements are regular but does have rather firm stool. Does not seem worse with fattier foods. Chest pain and epigastric pain thoguh also seems to be worse when he is active. At times goes down into LLQ and can even at times feel pain in groin. Seems to be worse in last few weeks. Urine has been darker over last 3-4 weeks as well. Of note did have a normal cath with Dr. Aundra Dubin in 2011.  A/P: Unclear etiology and nonspecific exam though no acute abdomen and time course would not support acute issue either. Given he is a diabetic with known shortness of breath with activity and now with lower sternal pain that seems to be worse with activity we will update a stress test (also with hyperlipidemia through on statin). Given epigastric and LUQ pain also check lipase and LFTs (along with itching). Given chronicity of pain nearly a year in upper abdomen we will get CT abd/pelvis. Part of this is the fact that follow up has not been as frequent as planned. With urine changes and some suprapubic pain check UA and urine culture. We are also going to trial PPI for 1 month and follow up at that time.  If workup is unrevealing, consider GI referral for consideration of EGD. With a year of issues and SOb not increasing, nor history DVT/PE nor leg swelling- doubt PE stongly as cause.   Diabetes mellitus type II, controlled ([place]) S: previously controlled with Metformin 1000mg  once daily with breakfast, benazepril to protect kidneys. a1c was trending up at last visit to 6.9 from 6.5. Patient has not returned to care in nearly a  year Lab Results  Component Value Date   HGBA1C 6.9* 01/07/2015  A/P: check a1c today , updated foot exam and has eye exam in February. Hopeful  7.5 or less at least.    Return precautions advised.   Orders Placed This Encounter  Procedures  . Urine culture    [facility]    Standing Status: Future     Number of Occurrences:      Standing Expiration Date: 12/31/2016  . CT Abdomen Pelvis W Contrast    Standing Status: Future     Number of Occurrences:      Standing Expiration Date: 12/31/2016    Order Specific Question:  If indicated for the ordered procedure, I authorize the administration of contrast media per Radiology protocol    Answer:  Yes    Order Specific Question:  Reason for Exam (SYMPTOM  OR DIAGNOSIS REQUIRED)    Answer:  LUQ and epigastric abdominal pain. radiates down left abdomen. year of symptoms- previously RUQ pain had u/s already last year    Order Specific Question:  Preferred imaging location?    Answer:  [facility]  . Flu Vaccine QUAD 36+ mos IM  . CBC with Differential/Platelet    Standing Status: Future     Number of Occurrences:      Standing Expiration Date: 12/31/2016  . Comprehensive metabolic panel    [place]    Standing Status: Future     Number of Occurrences:      Standing Expiration Date: 12/31/2016    Order Specific Question:  Has the patient fasted?    Answer:  No  . Lipid panel    [place]    Standing Status: Future     Number of Occurrences:      Standing Expiration Date: 12/31/2016    Order Specific Question:  Has the patient fasted?    Answer:  No  . Hemoglobin A1c    [place]    Standing Status: Future     Number of Occurrences:      Standing Expiration Date: 12/31/2016  . Lipase    Standing Status: Future     Number of Occurrences:      Standing Expiration Date: 12/31/2016  . PSA    Standing Status: Future     Number of Occurrences:      Standing Expiration Date: 12/31/2016  . Myocardial Perfusion Imaging    Standing Status: Future      Number of Occurrences:      Standing Expiration Date: 12/31/2016    Order Specific Question:  Where should this test be performed    Answer:  [facility])    Order Specific Question:  Type of stress    Answer:  Lexiscan    Order Specific Question:  Patient weight in lbs    Answer:  208  . POCT urinalysis dipstick    In house    Standing Status: Future     Number of Occurrences:      Standing Expiration Date: 12/31/2016

## 2016-01-04 ENCOUNTER — Other Ambulatory Visit: Payer: 59

## 2016-01-05 ENCOUNTER — Other Ambulatory Visit (INDEPENDENT_AMBULATORY_CARE_PROVIDER_SITE_OTHER): Payer: 59

## 2016-01-05 DIAGNOSIS — R1032 Left lower quadrant pain: Secondary | ICD-10-CM

## 2016-01-05 DIAGNOSIS — R319 Hematuria, unspecified: Secondary | ICD-10-CM | POA: Diagnosis not present

## 2016-01-05 DIAGNOSIS — R1013 Epigastric pain: Secondary | ICD-10-CM

## 2016-01-05 DIAGNOSIS — E119 Type 2 diabetes mellitus without complications: Secondary | ICD-10-CM

## 2016-01-05 DIAGNOSIS — Z0000 Encounter for general adult medical examination without abnormal findings: Secondary | ICD-10-CM | POA: Diagnosis not present

## 2016-01-05 DIAGNOSIS — G8929 Other chronic pain: Secondary | ICD-10-CM | POA: Diagnosis not present

## 2016-01-05 LAB — COMPREHENSIVE METABOLIC PANEL
ALBUMIN: 4.3 g/dL (ref 3.5–5.2)
ALK PHOS: 89 U/L (ref 39–117)
ALT: 23 U/L (ref 0–53)
AST: 22 U/L (ref 0–37)
BUN: 19 mg/dL (ref 6–23)
CALCIUM: 9.6 mg/dL (ref 8.4–10.5)
CHLORIDE: 102 meq/L (ref 96–112)
CO2: 30 mEq/L (ref 19–32)
Creatinine, Ser: 1.08 mg/dL (ref 0.40–1.50)
GFR: 74.15 mL/min (ref >60.00)
Glucose, Bld: 109 mg/dL — ABNORMAL HIGH (ref 70–99)
POTASSIUM: 5.1 meq/L (ref 3.5–5.1)
Sodium: 140 mEq/L (ref 135–145)
TOTAL PROTEIN: 7 g/dL (ref 6.0–8.3)
Total Bilirubin: 1 mg/dL (ref 0.2–1.2)

## 2016-01-05 LAB — POCT URINALYSIS DIPSTICK
Bilirubin, UA: NEGATIVE
Glucose, UA: NEGATIVE
Ketones, UA: NEGATIVE
Leukocytes, UA: NEGATIVE
NITRITE UA: NEGATIVE
PH UA: 6
Protein, UA: NEGATIVE
SPEC GRAV UA: 1.02
UROBILINOGEN UA: 0.2

## 2016-01-05 LAB — URINALYSIS, MICROSCOPIC ONLY

## 2016-01-05 LAB — HEMOGLOBIN A1C: HEMOGLOBIN A1C: 6.7 % — AB (ref 4.6–6.5)

## 2016-01-05 LAB — LIPID PANEL
CHOLESTEROL: 147 mg/dL (ref 0–200)
HDL: 41.3 mg/dL (ref >39.00)
LDL CALC: 85 mg/dL (ref 0–99)
NonHDL: 105.86
TRIGLYCERIDES: 103 mg/dL (ref 0.0–149.0)
Total CHOL/HDL Ratio: 4
VLDL: 20.6 mg/dL (ref 0.0–40.0)

## 2016-01-05 LAB — CBC WITH DIFFERENTIAL/PLATELET
BASOS PCT: 0.3 % (ref 0.0–3.0)
Basophils Absolute: 0 10*3/uL (ref 0.0–0.1)
EOS PCT: 2.1 % (ref 0.0–5.0)
Eosinophils Absolute: 0.2 10*3/uL (ref 0.0–0.7)
HCT: 41.8 % (ref 39.0–52.0)
HEMOGLOBIN: 13.4 g/dL (ref 13.0–17.0)
LYMPHS ABS: 2.9 10*3/uL (ref 0.7–4.0)
Lymphocytes Relative: 37.4 % (ref 12.0–46.0)
MCHC: 32 g/dL (ref 30.0–36.0)
MCV: 80.8 fl (ref 78.0–100.0)
MONO ABS: 0.7 10*3/uL (ref 0.1–1.0)
MONOS PCT: 8.8 % (ref 3.0–12.0)
Neutro Abs: 3.9 10*3/uL (ref 1.4–7.7)
Neutrophils Relative %: 51.4 % (ref 43.0–77.0)
Platelets: 275 10*3/uL (ref 150.0–400.0)
RBC: 5.18 Mil/uL (ref 4.22–5.81)
RDW: 16.2 % — AB (ref 11.5–15.5)
WBC: 7.6 10*3/uL (ref 4.0–10.5)

## 2016-01-05 LAB — LIPASE: Lipase: 45 U/L (ref 11.0–59.0)

## 2016-01-05 LAB — PSA: PSA: 0.46 ng/mL (ref 0.10–4.00)

## 2016-01-07 ENCOUNTER — Encounter: Payer: Self-pay | Admitting: Cardiology

## 2016-01-07 ENCOUNTER — Ambulatory Visit (INDEPENDENT_AMBULATORY_CARE_PROVIDER_SITE_OTHER)
Admission: RE | Admit: 2016-01-07 | Discharge: 2016-01-07 | Disposition: A | Discharge status: Home / Self Care | Payer: 59 | Source: Ambulatory Visit | Attending: Family Medicine | Admitting: Family Medicine

## 2016-01-07 ENCOUNTER — Telehealth (HOSPITAL_COMMUNITY): Payer: Self-pay | Admitting: *Deleted

## 2016-01-07 DIAGNOSIS — R1013 Epigastric pain: Secondary | ICD-10-CM

## 2016-01-07 DIAGNOSIS — G8929 Other chronic pain: Secondary | ICD-10-CM

## 2016-01-07 DIAGNOSIS — R1032 Left lower quadrant pain: Secondary | ICD-10-CM | POA: Diagnosis not present

## 2016-01-07 LAB — URINE CULTURE
Colony Count: NO GROWTH
ORGANISM ID, BACTERIA: NO GROWTH

## 2016-01-07 MED ORDER — IOHEXOL 300 MG/ML  SOLN: 100.0000 mL | Freq: Once | INTRAMUSCULAR | Status: DC | PRN

## 2016-01-07 NOTE — Telephone Encounter (Signed)
Patient given detailed instructions per Myocardial Perfusion Study Information Sheet for the test on 01/12/16 at 745. Patient notified to arrive 15 minutes early and that it is imperative to arrive on time for appointment to keep from having the test rescheduled.  If you need to cancel or reschedule your appointment, please call the office within 24 hours of your appointment. Failure to do so may result in a cancellation of your appointment, and a $50 no show fee. Patient verbalized understanding.Hubbard Robinson, RN

## 2016-01-08 ENCOUNTER — Inpatient Hospital Stay: Admission: RE | Admit: 2016-01-08 | Payer: 59 | Source: Ambulatory Visit

## 2016-01-12 ENCOUNTER — Ambulatory Visit (HOSPITAL_COMMUNITY): Payer: 59 | Attending: Cardiology

## 2016-01-12 DIAGNOSIS — I517 Cardiomegaly: Secondary | ICD-10-CM | POA: Diagnosis not present

## 2016-01-12 DIAGNOSIS — R079 Chest pain, unspecified: Secondary | ICD-10-CM

## 2016-01-12 DIAGNOSIS — I10 Essential (primary) hypertension: Secondary | ICD-10-CM | POA: Diagnosis not present

## 2016-01-12 LAB — MYOCARDIAL PERFUSION IMAGING
CHL CUP NUCLEAR SSS: 0
CHL CUP RESTING HR STRESS: 60 {beats}/min
CSEPPHR: 93 {beats}/min
LV dias vol: 99 mL
LVSYSVOL: 49 mL
NUC STRESS TID: 1.08
RATE: 0.28
SDS: 0
SRS: 0

## 2016-01-12 MED ORDER — TECHNETIUM TC 99M SESTAMIBI GENERIC - CARDIOLITE
10.4000 | Freq: Once | INTRAVENOUS | Status: AC | PRN
  Administered 2016-01-12: 10 via INTRAVENOUS

## 2016-01-12 MED ORDER — TECHNETIUM TC 99M SESTAMIBI GENERIC - CARDIOLITE
32.4000 | Freq: Once | INTRAVENOUS | Status: AC | PRN
  Administered 2016-01-12: 32 via INTRAVENOUS

## 2016-01-12 MED ORDER — REGADENOSON 0.4 MG/5ML IV SOLN
0.4000 mg | Freq: Once | INTRAVENOUS | Status: AC
  Administered 2016-01-12: 0.4 mg via INTRAVENOUS

## 2016-01-13 ENCOUNTER — Other Ambulatory Visit: Payer: Self-pay | Admitting: Family Medicine

## 2016-01-13 DIAGNOSIS — I209 Angina pectoris, unspecified: Secondary | ICD-10-CM

## 2016-01-25 ENCOUNTER — Ambulatory Visit (HOSPITAL_COMMUNITY): Payer: 59 | Attending: Internal Medicine

## 2016-01-25 ENCOUNTER — Other Ambulatory Visit: Payer: Self-pay

## 2016-01-25 DIAGNOSIS — I340 Nonrheumatic mitral (valve) insufficiency: Secondary | ICD-10-CM | POA: Diagnosis not present

## 2016-01-25 DIAGNOSIS — Z87891 Personal history of nicotine dependence: Secondary | ICD-10-CM | POA: Diagnosis not present

## 2016-01-25 DIAGNOSIS — I77810 Thoracic aortic ectasia: Secondary | ICD-10-CM | POA: Diagnosis not present

## 2016-01-25 DIAGNOSIS — E785 Hyperlipidemia, unspecified: Secondary | ICD-10-CM | POA: Diagnosis not present

## 2016-01-25 DIAGNOSIS — I517 Cardiomegaly: Secondary | ICD-10-CM | POA: Insufficient documentation

## 2016-01-25 DIAGNOSIS — I10 Essential (primary) hypertension: Secondary | ICD-10-CM | POA: Diagnosis not present

## 2016-01-25 DIAGNOSIS — I209 Angina pectoris, unspecified: Secondary | ICD-10-CM | POA: Diagnosis present

## 2016-02-04 ENCOUNTER — Other Ambulatory Visit: Payer: Self-pay | Admitting: Family Medicine

## 2016-07-13 ENCOUNTER — Other Ambulatory Visit: Payer: Self-pay | Admitting: Family Medicine

## 2016-08-09 ENCOUNTER — Other Ambulatory Visit: Payer: Self-pay | Admitting: Family Medicine

## 2016-10-05 ENCOUNTER — Other Ambulatory Visit: Payer: Self-pay | Admitting: Family Medicine

## 2016-10-14 ENCOUNTER — Encounter: Payer: Self-pay | Admitting: Family Medicine

## 2016-10-14 ENCOUNTER — Ambulatory Visit (INDEPENDENT_AMBULATORY_CARE_PROVIDER_SITE_OTHER): Payer: 59 | Admitting: Family Medicine

## 2016-10-14 VITALS — BP 122/82 | HR 86 | Temp 98.2°F | Wt 193.0 lb

## 2016-10-14 DIAGNOSIS — G250 Essential tremor: Secondary | ICD-10-CM | POA: Diagnosis not present

## 2016-10-14 DIAGNOSIS — E118 Type 2 diabetes mellitus with unspecified complications: Secondary | ICD-10-CM

## 2016-10-14 DIAGNOSIS — Z23 Encounter for immunization: Secondary | ICD-10-CM

## 2016-10-14 LAB — POCT GLYCOSYLATED HEMOGLOBIN (HGB A1C): Hemoglobin A1C: 5.9

## 2016-10-14 MED ORDER — DIAZEPAM 5 MG PO TABS: 5.0000 mg | ORAL_TABLET | Freq: Two times a day (BID) | ORAL | 1 refills | Status: DC | PRN

## 2016-10-14 NOTE — Assessment & Plan Note (Signed)
S: very well controlled. On metofrmin 1000mg  once daily with breakfast and benazepril to protect kidneys. Some GI discomfort- nausea. Mild fatigue. Now on 1 pill for 2 weeks.  Exercise and diet- lost 15 lbs with effort of healthier eating and regular exercise. Goal 10 more lbs Lab Results  Component Value Date   HGBA1C 5.9 10/14/2016   HGBA1C 6.7 (H) 01/05/2016   HGBA1C 6.9 (H) 01/07/2015   A/P: continue 500mg  once a day. Follow up about14 weeks for CPE and update a1c. If 6 or below may stop metformin completely then close follow up

## 2016-10-14 NOTE — Progress Notes (Signed)
Subjective:  Isaiah Dunn is a 60 y.o. year old very pleasant male patient who presents for/with See problem oriented charting ROS- No chest pain or shortness of breath. No headache or blurry vision.  No RUQ or epigastric pain or nausea.see any ROS included in HPI as well.   Past Medical History-  Patient Active Problem List   Diagnosis Date Noted  . Diabetes mellitus type II, controlled ([place]) 05/11/2007    Priority: High  . Retinal tear 03/17/2015    Priority: Medium  . Depression 10/06/2014    Priority: Medium  . Anemia 10/06/2014    Priority: Medium  . Diastolic dysfunction [id number]    Priority: Medium  . Essential tremor 09/06/2007    Priority: Medium  . Hyperlipidemia 05/11/2007    Priority: Medium  . RUQ pain 01/07/2015    Priority: Low  . UTI (urinary tract infection) 11/11/2013    Priority: Low  . History of herpes zoster 07/17/2009    Priority: Low  . Osteoarthritis 04/16/2008    Priority: Low    Medications- reviewed and updated Current Outpatient Prescriptions  Medication Sig Dispense Refill  . aspirin 81 MG chewable tablet Chew 81 mg by mouth daily.    Marland Kitchen atorvastatin (LIPITOR) 10 MG tablet TAKE 1 TABLET BY MOUTH EVERY DAY 30 tablet 5  . benazepril (LOTENSIN) 5 MG tablet TAKE 1 TABLET BY MOUTH EVERY DAY 30 tablet 3  . DULoxetine (CYMBALTA) 60 MG capsule TAKE ONE CAPSULE BY MOUTH EVERY DAY 60 capsule 5  . metFORMIN (GLUCOPHAGE-XR) 500 MG 24 hr tablet TAKE 2 TABLETS BY MOUTH EVERY DAY WITH BREAKFAST 60 tablet 3  . Multiple Vitamin (MULTIVITAMIN WITH MINERALS) TABS tablet Take 1 tablet by mouth once a week.     . diazepam (VALIUM) 5 MG tablet Take 1 tablet (5 mg total) by mouth every 12 (twelve) hours as needed (essential tremor). 30 tablet 1   No current facility-administered medications for this visit.     Objective: BP 122/82 (BP Location: Left Arm, Patient Position: Sitting, Cuff Size: Large)   Pulse 86   Temp 98.2 F (36.8 C) (Oral)   Wt 193 lb  (87.5 kg)   SpO2 97%   BMI 28.50 kg/m  Gen: NAD, resting comfortably CV: RRR no murmurs rubs or gallops Lungs: CTAB no crackles, wheeze, rhonchi Abdomen: soft/nontender/nondistended/normal bowel sounds. No rebound or guarding.  Ext: no edema  Assessment/Plan:  Diabetes mellitus type II, controlled ([place]) S: very well controlled. On metofrmin 1000mg  once daily with breakfast and benazepril to protect kidneys. Some GI discomfort- nausea. Mild fatigue. Now on 1 pill for 2 weeks.  Exercise and diet- lost 15 lbs with effort of healthier eating and regular exercise. Goal 10 more lbs Lab Results  Component Value Date   HGBA1C 5.9 10/14/2016   HGBA1C 6.7 (H) 01/05/2016   HGBA1C 6.9 (H) 01/07/2015   A/P: continue 500mg  once a day. Follow up about14 weeks for CPE and update a1c. If 6 or below may stop metformin completely then close follow up  Essential tremor S:Had seen neurology previously in 49s. Tried beta blockers and other medications in past with no relief. Valium 5mg  sparingly used. Worse recently and in times of stress. Recent stressors related all to wife's health- wife with breast cancer recent diagnosis. omentum cancer dealing with since 2014s. also had mi 2017 A/P: discussed refilling valium 5mg  for prn use- #30 with 1 refill and hopes lasts 6 months at least but may last longer.  RUQ pain This and chest pain/epigastric pain resolved completely with 6 weeks of PPI . See prior workup 01/01/16 if recurs   Return in about 14 weeks (around 01/20/2017) for physical.  Orders Placed This Encounter  Procedures  . Flu Vaccine QUAD 36+ mos IM  . POCT glycosylated hemoglobin (Hb A1C)    Meds ordered this encounter  Medications  . diazepam (VALIUM) 5 MG tablet    Sig: Take 1 tablet (5 mg total) by mouth every 12 (twelve) hours as needed (essential tremor).    Dispense:  30 tablet    Refill:  1    Return precautions advised.  Garret Reddish, MD

## 2016-10-14 NOTE — Patient Instructions (Addendum)
Please update your eye doctor  Reduce metformin to 500mg  once a day.   Physical in 3 months. Labs 01/15/17 or later.   Prescription for valium sparingly for tremor  Great job on weight loss

## 2016-10-14 NOTE — Assessment & Plan Note (Signed)
S:Had seen neurology previously in 70s. Tried beta blockers and other medications in past with no relief. Valium 5mg  sparingly used. Worse recently and in times of stress. Recent stressors related all to wife's health- wife with breast cancer recent diagnosis. omentum cancer dealing with since 2014s. also had mi 2017 A/P: discussed refilling valium 5mg  for prn use- #30 with 1 refill and hopes lasts 6 months at least but may last longer.

## 2016-10-14 NOTE — Assessment & Plan Note (Signed)
This and chest pain/epigastric pain resolved completely with 6 weeks of PPI . See prior workup 01/01/16 if recurs

## 2016-10-14 NOTE — Progress Notes (Signed)
Pre visit review using our clinic review tool, if applicable. No additional management support is needed unless otherwise documented below in the visit note. 

## 2016-10-20 ENCOUNTER — Ambulatory Visit: Payer: 59 | Admitting: Family Medicine

## 2016-11-03 ENCOUNTER — Other Ambulatory Visit: Payer: Self-pay | Admitting: Family Medicine

## 2016-12-28 ENCOUNTER — Other Ambulatory Visit (INDEPENDENT_AMBULATORY_CARE_PROVIDER_SITE_OTHER): Payer: 59

## 2016-12-28 DIAGNOSIS — Z0000 Encounter for general adult medical examination without abnormal findings: Secondary | ICD-10-CM | POA: Diagnosis not present

## 2016-12-28 LAB — POC URINALSYSI DIPSTICK (AUTOMATED)
BILIRUBIN UA: NEGATIVE
Blood, UA: NEGATIVE
Glucose, UA: NEGATIVE
KETONES UA: NEGATIVE
LEUKOCYTES UA: NEGATIVE
NITRITE UA: NEGATIVE
PH UA: 5
Protein, UA: NEGATIVE
Spec Grav, UA: 1.025
Urobilinogen, UA: 0.2

## 2016-12-28 LAB — BASIC METABOLIC PANEL
BUN: 16 mg/dL (ref 6–23)
CHLORIDE: 104 meq/L (ref 96–112)
CO2: 28 mEq/L (ref 19–32)
CREATININE: 1.2 mg/dL (ref 0.40–1.50)
Calcium: 9 mg/dL (ref 8.4–10.5)
GFR: 65.44 mL/min (ref >60.00)
GLUCOSE: 142 mg/dL — AB (ref 70–99)
Potassium: 4.5 mEq/L (ref 3.5–5.1)
Sodium: 141 mEq/L (ref 135–145)

## 2016-12-28 LAB — CBC WITH DIFFERENTIAL/PLATELET
BASOS PCT: 0.4 % (ref 0.0–3.0)
Basophils Absolute: 0 10*3/uL (ref 0.0–0.1)
EOS ABS: 0.3 10*3/uL (ref 0.0–0.7)
EOS PCT: 4.9 % (ref 0.0–5.0)
HCT: 42.7 % (ref 39.0–52.0)
HEMOGLOBIN: 14.3 g/dL (ref 13.0–17.0)
LYMPHS ABS: 2.5 10*3/uL (ref 0.7–4.0)
Lymphocytes Relative: 37 % (ref 12.0–46.0)
MCHC: 33.5 g/dL (ref 30.0–36.0)
MCV: 85.3 fl (ref 78.0–100.0)
MONO ABS: 0.7 10*3/uL (ref 0.1–1.0)
Monocytes Relative: 10 % (ref 3.0–12.0)
NEUTROS ABS: 3.2 10*3/uL (ref 1.4–7.7)
NEUTROS PCT: 47.7 % (ref 43.0–77.0)
PLATELETS: 265 10*3/uL (ref 150.0–400.0)
RBC: 5 Mil/uL (ref 4.22–5.81)
RDW: 13.4 % (ref 11.5–15.5)
WBC: 6.7 10*3/uL (ref 4.0–10.5)

## 2016-12-28 LAB — HEPATIC FUNCTION PANEL
ALT: 35 U/L (ref 0–53)
AST: 28 U/L (ref 0–37)
Albumin: 4.2 g/dL (ref 3.5–5.2)
Alkaline Phosphatase: 94 U/L (ref 39–117)
BILIRUBIN DIRECT: 0.2 mg/dL (ref 0.0–0.3)
BILIRUBIN TOTAL: 0.9 mg/dL (ref 0.2–1.2)
Total Protein: 6.5 g/dL (ref 6.0–8.3)

## 2016-12-28 LAB — PSA: PSA: 0.52 ng/mL (ref 0.10–4.00)

## 2016-12-28 LAB — LIPID PANEL
CHOL/HDL RATIO: 3
CHOLESTEROL: 129 mg/dL (ref 0–200)
HDL: 41.8 mg/dL (ref >39.00)
LDL CALC: 71 mg/dL (ref 0–99)
NonHDL: 87.17
TRIGLYCERIDES: 81 mg/dL (ref 0.0–149.0)
VLDL: 16.2 mg/dL (ref 0.0–40.0)

## 2016-12-28 LAB — MICROALBUMIN / CREATININE URINE RATIO
CREATININE, U: 291.1 mg/dL
MICROALB UR: 1.2 mg/dL (ref 0.0–1.9)
MICROALB/CREAT RATIO: 0.4 mg/g (ref 0.0–30.0)

## 2016-12-28 LAB — TSH: TSH: 1.46 u[IU]/mL (ref 0.35–4.50)

## 2016-12-28 LAB — HEMOGLOBIN A1C: Hgb A1c MFr Bld: 6.3 % (ref 4.6–6.5)

## 2017-01-02 ENCOUNTER — Encounter: Payer: Self-pay | Admitting: Family Medicine

## 2017-01-02 ENCOUNTER — Ambulatory Visit (INDEPENDENT_AMBULATORY_CARE_PROVIDER_SITE_OTHER): Payer: 59 | Admitting: Family Medicine

## 2017-01-02 VITALS — BP 128/80 | HR 109 | Temp 97.9°F | Ht 69.0 in | Wt 198.0 lb

## 2017-01-02 DIAGNOSIS — Z0000 Encounter for general adult medical examination without abnormal findings: Secondary | ICD-10-CM | POA: Diagnosis not present

## 2017-01-02 NOTE — Progress Notes (Signed)
Pre visit review using our clinic review tool, if applicable. No additional management support is needed unless otherwise documented below in the visit note. 

## 2017-01-02 NOTE — Progress Notes (Signed)
Phone: [phone]  Subjective:  Patient presents today for their annual physical. Chief complaint-noted.   See problem oriented charting- ROS- full  review of systems was completed and negative except for: shortness of breath with more intense activity per baseline  The following were reviewed and entered/updated in epic: Past Medical History:  Diagnosis Date  . Diastolic dysfunction, left ventricle    GRADE 2  . Diverticulosis   . Essential tremor    BENIGN--   . History of kidney stones   . History of sepsis    SIRS post ureteroscopic stone extraction w/ stent placement 10-29-2013--  resolved  . Hyperlipidemia   . Hypertension   . OA (osteoarthritis)   . Retina disorder, left    LEFT EYE RETINAL BUBBLE (NO TEAR OR DETACHMENT)  PT IS SEEING DR Baird Cancer 03-11-2015 TO HAVE LASERED--  PT SEE FLOATERS  . Right knee meniscal tear   . Type 2 diabetes mellitus [facility])    Patient Active Problem List   Diagnosis Date Noted  . Diabetes mellitus type II, controlled ([place]) 05/11/2007    Priority: High  . Retinal tear 03/17/2015    Priority: Medium  . Depression 10/06/2014    Priority: Medium  . Anemia 10/06/2014    Priority: Medium  . Diastolic dysfunction [id number]    Priority: Medium  . Essential tremor 09/06/2007    Priority: Medium  . Hyperlipidemia 05/11/2007    Priority: Medium  . RUQ pain 01/07/2015    Priority: Low  . UTI (urinary tract infection) 11/11/2013    Priority: Low  . History of herpes zoster 07/17/2009    Priority: Low  . Osteoarthritis 04/16/2008    Priority: Low   Past Surgical History:  Procedure Laterality Date  . ANTERIOR CERVICAL DECOMP/DISCECTOMY FUSION  04-24-2008   C5  -- C6  . CARDIAC CATHETERIZATION  06-21-2010  DR Loralie Champagne   RCA 25%/ MINIMAL DISEASE LM, CX  . CARDIOVASCULAR STRESS TEST  06-10-2010   NORMAL NUCLEAR STUDY BUT  CLINICALL ABNORMAL (CHEST PAIN, ST ABNORMALITY, HYPOTENSION)/   EF 61%  . CYSTOSCOPY WITH RETROGRADE  PYELOGRAM, URETEROSCOPY AND STENT PLACEMENT Left 10/30/2013   Procedure: CYSTOSCOPY WITH RETROGRADE PYELOGRAM, URETEROSCOPY AND STENT PLACEMENT;  Surgeon: Molli Hazard, MD;  Location: [facility];  Service: Urology;  Laterality: Left;  . HOLMIUM LASER APPLICATION Left [id number]   Procedure: HOLMIUM LASER APPLICATION;  Surgeon: Molli Hazard, MD;  Location: [facility];  Service: Urology;  Laterality: Left;  . KNEE ARTHROSCOPY W/ DEBRIDEMENT Right X3  LAST ONE 12-31-2008  . KNEE ARTHROSCOPY WITH MEDIAL MENISECTOMY Right 03/13/2015   Procedure: RIGHT KNEE ARTHROSCOPY WITH PARTIAL MEDIAL MENISECTOMY AND CHONDROPLASTY ;  Surgeon: Susa Day, MD;  Location: [place];  Service: Orthopedics;  Laterality: Right;  . RETINAL DETACHMENT SURGERY Left 03-11-2015  . SHOULDER ARTHROSCOPY Right X2  LAST ONE 04-25-2006  . SHOULDER ARTHROSCOPY WITH ROTATOR CUFF REPAIR AND SUBACROMIAL DECOMPRESSION Left 05-20-2010   AND DEBRIDEMENT BICEP TENDON/ OPEN DISTAL CLAVICAL RESECTON  . TONSILLECTOMY  AGE 72  . TRANSTHORACIC ECHOCARDIOGRAM  07-21-2010   GRADE II DIASTOLIC DYSFUNCTION/  EF 55%/  MILD DILATED AORTIC ROOT  . VASECTOMY REVERSAL  1993    Family History  Problem Relation Age of Onset  . Arthritis Mother   . Hypertension Mother   . Hyperlipidemia Mother   . Hypertension Father   . Hyperlipidemia Father     Medications- reviewed and updated Current Outpatient Prescriptions  Medication Sig  Dispense Refill  . aspirin 81 MG chewable tablet Chew 81 mg by mouth daily.    Marland Kitchen atorvastatin (LIPITOR) 10 MG tablet TAKE 1 TABLET BY MOUTH EVERY DAY 30 tablet 5  . benazepril (LOTENSIN) 5 MG tablet TAKE 1 TABLET BY MOUTH EVERY DAY 30 tablet 3  . diazepam (VALIUM) 5 MG tablet Take 1 tablet (5 mg total) by mouth every 12 (twelve) hours as needed (essential tremor). 30 tablet 1  . DULoxetine (CYMBALTA) 60 MG capsule TAKE ONE CAPSULE BY MOUTH EVERY DAY 60  capsule 5  . metFORMIN (GLUCOPHAGE-XR) 500 MG 24 hr tablet TAKE 2 TABLETS BY MOUTH EVERY DAY WITH BREAKFAST 60 tablet 3  . Multiple Vitamin (MULTIVITAMIN WITH MINERALS) TABS tablet Take 1 tablet by mouth once a week.      No current facility-administered medications for this visit.     Allergies-reviewed and updated Allergies  Allergen Reactions  . Celebrex [Celecoxib] Other (See Comments)    GI UPSET  . Clindamycin Hcl Other (See Comments)    GI UPSET  . Erythromycin Other (See Comments)    GI UPSET    Social History   Social History  . Marital status: Married    Spouse name: N/A  . Number of children: N/A  . Years of education: N/A   Social History Main Topics  . Smoking status: Former Smoker    Packs/day: 1.00    Years: 20.00    Types: Cigarettes    Quit date: 12/26/2000  . Smokeless tobacco: Never Used  . Alcohol use 1.8 oz/week    1 Cans of beer, 2 Standard drinks or equivalent per week     Comment: average one beer weekly  . Drug use: No  . Sexual activity: Not Asked   Other Topics Concern  . None   Social History Narrative   Wife has cancer (omentum- rare and tough to treat- will likely pass)   1 dog left      Regularly gives blood to red cross as he thinks he may not be able to help wife, but he can help others. Has also given platelets.     Objective: BP 128/80 (BP Location: Left Arm, Patient Position: Sitting, Cuff Size: Normal)   Pulse (!) 109   Temp 97.9 F (36.6 C) (Oral)   Ht 5\' 9"  (1.753 m)   Wt 198 lb (89.8 kg)   SpO2 98%   BMI 29.24 kg/m  Gen: NAD, resting comfortably HEENT: Mucous membranes are moist. Oropharynx normal Neck: no thyromegaly CV: RRR no murmurs rubs or gallops Lungs: CTAB no crackles, wheeze, rhonchi Abdomen: soft/nontender/nondistended/normal bowel sounds. No rebound or guarding.  Ext: no edema Skin: warm, dry Neuro: grossly normal, moves all extremities, PERRLA Rectal: normal tone, normal sized prostate, no masses or  tenderness  Diabetic Foot Exam - Simple   Simple Foot Form Diabetic Foot exam was performed with the following findings:  Yes 01/02/2017 11:11 AM  Visual Inspection No deformities, no ulcerations, no other skin breakdown bilaterally:  Yes Sensation Testing Intact to touch and monofilament testing bilaterally:  Yes Pulse Check Posterior Tibialis and Dorsalis pulse intact bilaterally:  Yes Comments    Assessment/Plan:  61 y.o. male presenting for annual physical.  Health Maintenance counseling: 1. Anticipatory guidance: Patient counseled regarding regular dental exams, eye exams, wearing seatbelts.  2. Risk factor reduction:  Advised patient of need for regular exercise and diet rich and fruits and vegetables to reduce risk of heart attack and stroke. Doing pretty  well with exercise (walks with dog fair amount). Doing really well before holidays- is to return to the prior regimen 3. Immunizations/screenings/ancillary studies Immunization History  Administered Date(s) Administered  . Influenza Split 09/30/2011, 09/17/2012  . Influenza Whole 09/18/2008  . Influenza,inj,Quad PF,36+ Mos 10/06/2014, 01/01/2016, 10/14/2016  . Pneumococcal Polysaccharide-23 04/17/2009, 01/07/2015  . Td 04/17/2009   Health Maintenance Due  Topic Date Due  . OPHTHALMOLOGY EXAM - Dr. Gwynn Burly yearly- needs to update exam 02/03/1966  . ZOSTAVAX - hold off for one year- consider shingrix if available 02/04/2016  . FOOT EXAM - normal today 12/31/2016   4. Prostate cancer screening- low risk PSA trend and rectal exam   Lab Results  Component Value Date   PSA 0.52 12/28/2016   PSA 0.46 01/05/2016   PSA 0.73 09/19/2014   5. Colon cancer screening - 04/17/2009 with 10 year follow up 6. Skin cancer prevention- advise regular sunscreen  Status of chronic or acute concerns   DM- controlled on metformin 500mg  once a day down from 1000mg  once a day. Also on benazepril to protect kidneys  HLD- looks good on  atorvastatin 10mg , no changes  Anemia doing better- ? If b12 related with less metformin.   Diastolic dysfunction- gets winded with intense activity bu totherwise none Depression- cymbalta controlling depression.  Essential tremor- valium gives him some relief. mutliple other attempts at therapy and has seen neurology. Beta blockers had not helped.  Return precautions advised.   Garret Reddish, MD

## 2017-01-02 NOTE — Patient Instructions (Addendum)
Get your update eye exam and have them send me a copy  Wt Readings from Last 3 Encounters:  01/02/17 198 lb (89.8 kg)  10/14/16 193 lb (87.5 kg)  01/12/16 208 lb (94.3 kg)  Overall you have gone in right direction. Holidays have caused slight bump- hopefully with returning to prior diet pattern- you can get metformin below 6 and consider coming off metfomin  Wait until next year on shingles with new vaccine coming out  I will leave follow up to you in 4-6 month range since diabetes so well controlled.

## 2017-01-29 ENCOUNTER — Other Ambulatory Visit: Payer: Self-pay | Admitting: Family Medicine

## 2017-02-13 LAB — HM DIABETES EYE EXAM

## 2017-02-17 ENCOUNTER — Encounter: Payer: Self-pay | Admitting: Family Medicine

## 2017-03-07 ENCOUNTER — Other Ambulatory Visit: Payer: Self-pay | Admitting: Family Medicine

## 2017-03-14 ENCOUNTER — Telehealth: Payer: 59 | Admitting: Nurse Practitioner

## 2017-03-14 DIAGNOSIS — J209 Acute bronchitis, unspecified: Secondary | ICD-10-CM

## 2017-03-14 DIAGNOSIS — J069 Acute upper respiratory infection, unspecified: Secondary | ICD-10-CM

## 2017-03-14 MED ORDER — BENZONATATE 100 MG PO CAPS: 100.0000 mg | ORAL_CAPSULE | Freq: Three times a day (TID) | ORAL | 0 refills | Status: AC | PRN

## 2017-03-14 NOTE — Progress Notes (Signed)
We are sorry that you are not feeling well.  Here is how we plan to help!  Based on what you have shared with me it looks like you have upper respiratory tract inflammation that has resulted in a significant cough.  Inflammation and infection in the upper respiratory tract is commonly called bronchitis and has four common causes:  Allergies, Viral Infections, Acid Reflux and Bacterial Infections.  Allergies, viruses and acid reflux are treated by controlling symptoms or eliminating the cause. An example might be a cough caused by taking certain blood pressure medications. You stop the cough by changing the medication. Another example might be a cough caused by acid reflux. Controlling the reflux helps control the cough.  Based on your presentation I believe you most likely have A cough due to a virus.  This is called viral bronchitis and is best treated by rest, plenty of fluids and control of the cough.  You may use Ibuprofen or Tylenol as directed to help your symptoms.     In addition you may use A prescription cough medication called Tessalon Perles 100mg. You may take 1-2 capsules every 8 hours as needed for your cough.  USE OF BRONCHODILATOR ("RESCUE") INHALERS: There is a risk from using your bronchodilator too frequently.  The risk is that over-reliance on a medication which only relaxes the muscles surrounding the breathing tubes can reduce the effectiveness of medications prescribed to reduce swelling and congestion of the tubes themselves.  Although you feel brief relief from the bronchodilator inhaler, your asthma may actually be worsening with the tubes becoming more swollen and filled with mucus.  This can delay other crucial treatments, such as oral steroid medications. If you need to use a bronchodilator inhaler daily, several times per day, you should discuss this with your provider.  There are probably better treatments that could be used to keep your asthma under control.     HOME  CARE . Only take medications as instructed by your medical team. . Complete the entire course of an antibiotic. . Drink plenty of fluids and get plenty of rest. . Avoid close contacts especially the very young and the elderly . Cover your mouth if you cough or cough into your sleeve. . Always remember to wash your hands . A steam or ultrasonic humidifier can help congestion.   GET HELP RIGHT AWAY IF: . You develop worsening fever. . You become short of breath . You cough up blood. . Your symptoms persist after you have completed your treatment plan MAKE SURE YOU   Understand these instructions.  Will watch your condition.  Will get help right away if you are not doing well or get worse.  Your e-visit answers were reviewed by a board certified advanced clinical practitioner to complete your personal care plan.  Depending on the condition, your plan could have included both over the counter or prescription medications. If there is a problem please reply  once you have received a response from your provider. Your safety is important to us.  If you have drug allergies check your prescription carefully.    You can use MyChart to ask questions about today's visit, request a non-urgent call back, or ask for a work or school excuse for 24 hours related to this e-Visit. If it has been greater than 24 hours you will need to follow up with your provider, or enter a new e-Visit to address those concerns. You will get an e-mail in the next two days   asking about your experience.  I hope that your e-visit has been valuable and will speed your recovery. Thank you for using e-visits.   

## 2017-03-28 ENCOUNTER — Other Ambulatory Visit: Payer: Self-pay | Admitting: Family Medicine

## 2017-06-01 ENCOUNTER — Ambulatory Visit (INDEPENDENT_AMBULATORY_CARE_PROVIDER_SITE_OTHER): Payer: 59 | Admitting: Family Medicine

## 2017-06-01 ENCOUNTER — Encounter: Payer: Self-pay | Admitting: Family Medicine

## 2017-06-01 VITALS — BP 104/70 | HR 74 | Temp 98.1°F | Ht 69.0 in | Wt 199.6 lb

## 2017-06-01 DIAGNOSIS — S70362A Insect bite (nonvenomous), left thigh, initial encounter: Secondary | ICD-10-CM

## 2017-06-01 DIAGNOSIS — R110 Nausea: Secondary | ICD-10-CM | POA: Diagnosis not present

## 2017-06-01 DIAGNOSIS — R5381 Other malaise: Secondary | ICD-10-CM

## 2017-06-01 DIAGNOSIS — W57XXXA Bitten or stung by nonvenomous insect and other nonvenomous arthropods, initial encounter: Secondary | ICD-10-CM | POA: Diagnosis not present

## 2017-06-01 MED ORDER — DOXYCYCLINE HYCLATE 100 MG PO TABS: 100.0000 mg | ORAL_TABLET | Freq: Two times a day (BID) | ORAL | 0 refills | Status: DC

## 2017-06-01 NOTE — Progress Notes (Signed)
HPI:  Acute visit for tick bite: -pulled small biting tick off L thigh 3 days ago - thinks was biting at least 24 hours, was small, not engorged -he is worried about tick borne illness a feels " a little off" today -mild headache, mild malaise, mild nausea -denies fevers, vomiting, body aches, joint pains, SOB, vomiting, diarrhea, abd pain, bad headaches, sore throat, weakness -has well controlled diabetes per report  ROS: See pertinent positives and negatives per HPI.  Past Medical History:  Diagnosis Date  . Diastolic dysfunction, left ventricle    GRADE 2  . Diverticulosis   . Essential tremor    BENIGN--   . History of kidney stones   . History of sepsis    SIRS post ureteroscopic stone extraction w/ stent placement 10-29-2013--  resolved  . Hyperlipidemia   . Hypertension   . OA (osteoarthritis)   . Retina disorder, left    LEFT EYE RETINAL BUBBLE (NO TEAR OR DETACHMENT)  PT IS SEEING DR Baird Cancer 03-11-2015 TO HAVE LASERED--  PT SEE FLOATERS  . Right knee meniscal tear   . Type 2 diabetes mellitus ([place])     Past Surgical History:  Procedure Laterality Date  . ANTERIOR CERVICAL DECOMP/DISCECTOMY FUSION  04-24-2008   C5  -- C6  . CARDIAC CATHETERIZATION  06-21-2010  DR Loralie Champagne   RCA 25%/ MINIMAL DISEASE LM, CX  . CARDIOVASCULAR STRESS TEST  06-10-2010   NORMAL NUCLEAR STUDY BUT  CLINICALL ABNORMAL (CHEST PAIN, ST ABNORMALITY, HYPOTENSION)/   EF 61%  . CYSTOSCOPY WITH RETROGRADE PYELOGRAM, URETEROSCOPY AND STENT PLACEMENT Left 10/30/2013   Procedure: CYSTOSCOPY WITH RETROGRADE PYELOGRAM, URETEROSCOPY AND STENT PLACEMENT;  Surgeon: Molli Hazard, MD;  Location: [facility];  Service: Urology;  Laterality: Left;  . HOLMIUM LASER APPLICATION Left 93/06/9023   Procedure: HOLMIUM LASER APPLICATION;  Surgeon: Molli Hazard, MD;  Location: [facility];  Service: Urology;  Laterality: Left;  . KNEE ARTHROSCOPY W/ DEBRIDEMENT Right  X3  LAST ONE 12-31-2008  . KNEE ARTHROSCOPY WITH MEDIAL MENISECTOMY Right 03/13/2015   Procedure: RIGHT KNEE ARTHROSCOPY WITH PARTIAL MEDIAL MENISECTOMY AND CHONDROPLASTY ;  Surgeon: Susa Day, MD;  Location: [place];  Service: Orthopedics;  Laterality: Right;  . RETINAL DETACHMENT SURGERY Left 03-11-2015  . SHOULDER ARTHROSCOPY Right X2  LAST ONE 04-25-2006  . SHOULDER ARTHROSCOPY WITH ROTATOR CUFF REPAIR AND SUBACROMIAL DECOMPRESSION Left 05-20-2010   AND DEBRIDEMENT BICEP TENDON/ OPEN DISTAL CLAVICAL RESECTON  . TONSILLECTOMY  AGE 55  . TRANSTHORACIC ECHOCARDIOGRAM  07-21-2010   GRADE II DIASTOLIC DYSFUNCTION/  EF 55%/  MILD DILATED AORTIC ROOT  . VASECTOMY REVERSAL  1993    Family History  Problem Relation Age of Onset  . Arthritis Mother   . Hypertension Mother   . Hyperlipidemia Mother   . Hypertension Father   . Hyperlipidemia Father     Social History   Social History  . Marital status: Married    Spouse name: N/A  . Number of children: N/A  . Years of education: N/A   Social History Main Topics  . Smoking status: Former Smoker    Packs/day: 1.00    Years: 20.00    Types: Cigarettes    Quit date: 12/26/2000  . Smokeless tobacco: Never Used  . Alcohol use 1.8 oz/week    1 Cans of beer, 2 Standard drinks or equivalent per week     Comment: average one beer weekly  . Drug use: No  .  Sexual activity: Not Asked   Other Topics Concern  . None   Social History Narrative   Wife has cancer (omentum- rare and tough to treat- will likely pass)   1 dog left      Regularly gives blood to red cross as he thinks he may not be able to help wife, but he can help others. Has also given platelets.      Current Outpatient Prescriptions:  .  aspirin 81 MG chewable tablet, Chew 81 mg by mouth daily., Disp: , Rfl:  .  atorvastatin (LIPITOR) 10 MG tablet, TAKE 1 TABLET BY MOUTH EVERY DAY, Disp: 30 tablet, Rfl: 5 .  benazepril (LOTENSIN) 5 MG tablet, TAKE 1  TABLET BY MOUTH EVERY DAY, Disp: 30 tablet, Rfl: 3 .  diazepam (VALIUM) 5 MG tablet, TAKE 1 TABLET BY MOUTH EVERY 12 HOURS AS NEEDED, Disp: 30 tablet, Rfl: 1 .  DULoxetine (CYMBALTA) 60 MG capsule, TAKE ONE CAPSULE BY MOUTH EVERY DAY, Disp: 60 capsule, Rfl: 5 .  metFORMIN (GLUCOPHAGE-XR) 500 MG 24 hr tablet, TAKE 2 TABLETS BY MOUTH EVERY DAY WITH BREAKFAST (Patient taking differently: TAKE 1 TABLET BY MOUTH EVERY DAY WITH BREAKFAST), Disp: 60 tablet, Rfl: 3 .  Multiple Vitamin (MULTIVITAMIN WITH MINERALS) TABS tablet, Take 1 tablet by mouth once a week. , Disp: , Rfl:  .  doxycycline (VIBRA-TABS) 100 MG tablet, Take 1 tablet (100 mg total) by mouth 2 (two) times daily., Disp: 20 tablet, Rfl: 0  EXAM:  Vitals:   06/01/17 1556  BP: 104/70  Pulse: 74  Temp: 98.1 F (36.7 C)    Body mass index is 29.48 kg/m.  GENERAL: vitals reviewed and listed above, alert, oriented, appears well hydrated and in no acute distress  HEENT: atraumatic, conjunttiva clear, no obvious abnormalities on inspection of external nose and ears  NECK: no obvious masses on inspection, no LAD  LUNGS: clear to auscultation bilaterally, no wheezes, rales or rhonchi, good air movement  ABD: Soft, NTTP  SKIN: no rash, small erythematous papule at site of tick bite  CV: HRRR, no peripheral edema  MS: moves all extremities without noticeable abnormality  PSYCH: pleasant and cooperative, no obvious depression or anxiety  ASSESSMENT AND PLAN:  Discussed the following assessment and plan:  Tick bite, initial encounter  Nausea  Malaise  -we discussed possible serious and likely etiologies, workup and treatment, treatment risks and return precautions - viral illness vs mild early tick borne illness vs other all possible - he is quite worried about tick borne disease -after this discussion, Isaiah Dunn opted for course of doxy in case this is mild early RMSF after discussion risks/benefits/tick borne illness, lyme  less likely but doxy will also cover -discussed prevention tick bites -of course, we advised Isaiah Dunn  to return or notify a doctor immediately if symptoms worsen or persist or new concerns arise.  Patient Instructions  Take the antibiotic (doxycycline) as instructed.  I hope you are feeling better soon! Seek care immediately if worsening, new concerns or you are not improving with treatment.     Isaiah Benton R., DO \

## 2017-06-01 NOTE — Patient Instructions (Signed)
Take the antibiotic (doxycycline) as instructed.  I hope you are feeling better soon! Seek care immediately if worsening, new concerns or you are not improving with treatment.

## 2017-07-05 ENCOUNTER — Other Ambulatory Visit: Payer: Self-pay | Admitting: Family Medicine

## 2017-09-11 ENCOUNTER — Encounter: Payer: Self-pay | Admitting: Family Medicine

## 2017-09-11 ENCOUNTER — Ambulatory Visit (INDEPENDENT_AMBULATORY_CARE_PROVIDER_SITE_OTHER): Payer: 59 | Admitting: Family Medicine

## 2017-09-11 VITALS — BP 118/82 | HR 79 | Temp 98.1°F | Ht 69.0 in | Wt 201.4 lb

## 2017-09-11 DIAGNOSIS — E119 Type 2 diabetes mellitus without complications: Secondary | ICD-10-CM | POA: Diagnosis not present

## 2017-09-11 DIAGNOSIS — D649 Anemia, unspecified: Secondary | ICD-10-CM

## 2017-09-11 DIAGNOSIS — F325 Major depressive disorder, single episode, in full remission: Secondary | ICD-10-CM | POA: Diagnosis not present

## 2017-09-11 DIAGNOSIS — E785 Hyperlipidemia, unspecified: Secondary | ICD-10-CM

## 2017-09-11 DIAGNOSIS — Z23 Encounter for immunization: Secondary | ICD-10-CM | POA: Diagnosis not present

## 2017-09-11 LAB — BASIC METABOLIC PANEL WITH GFR
BUN: 19 mg/dL (ref 6–23)
CO2: 29 meq/L (ref 19–32)
Calcium: 9.6 mg/dL (ref 8.4–10.5)
Chloride: 102 meq/L (ref 96–112)
Creatinine, Ser: 1.04 mg/dL (ref 0.40–1.50)
GFR: 77.01 mL/min (ref >60.00)
Glucose, Bld: 62 mg/dL — ABNORMAL LOW (ref 70–99)
Potassium: 4.1 meq/L (ref 3.5–5.1)
Sodium: 138 meq/L (ref 135–145)

## 2017-09-11 LAB — HEMOGLOBIN A1C: HEMOGLOBIN A1C: 6.5 % (ref 4.6–6.5)

## 2017-09-11 NOTE — Assessment & Plan Note (Signed)
S: cymbalta 60mg  compliant. Depression is controlled- some stress in caring for wife with omentum cancer that is incurable- has been going on for 4 years and has had 82 chemo treatments to date A/P: doing reasonably well- counseled patient- will continue to check in considering the tough burden on his shoulders

## 2017-09-11 NOTE — Assessment & Plan Note (Signed)
S: well controlled. On metformin 500mg  once a day. Benazepril for renal protective effects- pees 2-3 x right after taking this.   Weight up 3 lbs sine last visit. Thinks stress related. States has actually los 2 lbs after losing 5 - still exercising so working on improving diet.  Lab Results  Component Value Date   HGBA1C 6.3 12/28/2016   HGBA1C 5.9 10/14/2016   HGBA1C 6.7 (H) 01/05/2016   A/P: hopefully a1c remains below 7. Discussed a few lbs weight loss advised.

## 2017-09-11 NOTE — Progress Notes (Signed)
Subjective:  Isaiah Dunn is a 61 y.o. year old very pleasant male patient who presents for/with See problem oriented charting ROS- denies anhedonia. Admits to some stress in caring for wife. No chest pain or shrotness of breath. No hypoglycemia. More energy on 1 metformin a day instead of 2   Past Medical History-  Patient Active Problem List   Diagnosis Date Noted  . Diabetes mellitus type II, controlled ([place]) 05/11/2007    Priority: High  . Retinal tear 03/17/2015    Priority: Medium  . Depression 10/06/2014    Priority: Medium  . Anemia 10/06/2014    Priority: Medium  . Diastolic dysfunction 10/93/2355    Priority: Medium  . Essential tremor 09/06/2007    Priority: Medium  . Hyperlipidemia 05/11/2007    Priority: Medium  . RUQ pain 01/07/2015    Priority: Low  . UTI (urinary tract infection) 11/11/2013    Priority: Low  . History of herpes zoster 07/17/2009    Priority: Low  . Osteoarthritis 04/16/2008    Priority: Low    Medications- reviewed and updated Current Outpatient Prescriptions  Medication Sig Dispense Refill  . aspirin 81 MG chewable tablet Chew 81 mg by mouth daily.    Marland Kitchen atorvastatin (LIPITOR) 10 MG tablet TAKE 1 TABLET BY MOUTH EVERY DAY 30 tablet 5  . benazepril (LOTENSIN) 5 MG tablet TAKE 1 TABLET BY MOUTH EVERY DAY 30 tablet 3  . diazepam (VALIUM) 5 MG tablet TAKE 1 TABLET BY MOUTH EVERY 12 HOURS AS NEEDED 30 tablet 1  . DULoxetine (CYMBALTA) 60 MG capsule TAKE ONE CAPSULE BY MOUTH EVERY DAY 60 capsule 5  . metFORMIN (GLUCOPHAGE-XR) 500 MG 24 hr tablet TAKE 2 TABLETS BY MOUTH EVERY DAY WITH BREAKFAST (Patient taking differently: TAKE 1 TABLET BY MOUTH EVERY DAY WITH BREAKFAST) 60 tablet 3  . Multiple Vitamin (MULTIVITAMIN WITH MINERALS) TABS tablet Take 1 tablet by mouth once a week.      No current facility-administered medications for this visit.     Objective: BP 118/82 (BP Location: Left Arm, Patient Position: Sitting, Cuff Size: Large)    Pulse 79   Temp 98.1 F (36.7 C) (Oral)   Ht 5\' 9"  (1.753 m)   Wt 201 lb 6.4 oz (91.4 kg)   SpO2 94%   BMI 29.74 kg/m  Gen: NAD, resting comfortably CV: RRR no murmurs rubs or gallops Lungs: CTAB no crackles, wheeze, rhonchi Abdomen: overweight Ext: no edema Skin: warm, dry Neuro: normal gait  Assessment/Plan:  Tennis elbow left- improving with icing/stretching. Does not want ortho/injection  Diabetes mellitus type II, controlled ([place]) S: well controlled. On metformin 500mg  once a day. Benazepril for renal protective effects- pees 2-3 x right after taking this.   Weight up 3 lbs sine last visit. Thinks stress related. States has actually los 2 lbs after losing 5 - still exercising so working on improving diet.  Lab Results  Component Value Date   HGBA1C 6.3 12/28/2016   HGBA1C 5.9 10/14/2016   HGBA1C 6.7 (H) 01/05/2016   A/P: hopefully a1c remains below 7. Discussed a few lbs weight loss advised.   Hyperlipidemia S: reasonably controlled on atorvastatin 10mg  with LDL 71 in january. No myalgias.  A/P: continue current medications  Anemia S: mild anemia in past- resolved on last check. Doing mainly platelets now instead of double reds A/P: has been donating platelets and no issues on hgb there so will hold off on check until cpe  Depression S: cymbalta 60mg   compliant. Depression is controlled- some stress in caring for wife with omentum cancer that is incurable- has been going on for 4 years and has had 62 chemo treatments to date A/P: doing reasonably well- counseled patient- will continue to check in considering the tough burden on his shoulders   Return in about 6 months (around 03/11/2018) for physical. He may actually do this in January. Discussed trimming his extra 3 lbs back off at least  Orders Placed This Encounter  Procedures  . Flu Vaccine QUAD 36+ mos IM  . Hemoglobin A1c    [place]    Standing Status:   Future    Number of Occurrences:   1    Standing  Expiration Date:   09/11/2018  . Basic metabolic panel    Standing Status:   Future    Number of Occurrences:   1    Standing Expiration Date:   09/11/2018   Return precautions advised.  Garret Reddish, MD

## 2017-09-11 NOTE — Assessment & Plan Note (Signed)
S: mild anemia in past- resolved on last check. Doing mainly platelets now instead of double reds A/P: has been donating platelets and no issues on hgb there so will hold off on check until cpe

## 2017-09-11 NOTE — Assessment & Plan Note (Signed)
S: reasonably controlled on atorvastatin 10mg  with LDL 71 in january. No myalgias.  A/P: continue current medications

## 2017-09-11 NOTE — Patient Instructions (Signed)
Thanks for getting flu shot  Please stop by lab before you go

## 2017-09-13 ENCOUNTER — Other Ambulatory Visit: Payer: Self-pay | Admitting: Family Medicine

## 2017-09-14 NOTE — Telephone Encounter (Signed)
Rx phoned into pharmacy.

## 2017-09-25 ENCOUNTER — Other Ambulatory Visit: Payer: Self-pay | Admitting: Family Medicine

## 2017-09-28 ENCOUNTER — Encounter: Payer: Self-pay | Admitting: Family Medicine

## 2017-09-29 ENCOUNTER — Other Ambulatory Visit: Payer: Self-pay | Admitting: *Deleted

## 2017-09-29 MED ORDER — DULOXETINE HCL 60 MG PO CPEP: 60.0000 mg | ORAL_CAPSULE | Freq: Every day | ORAL | 5 refills | Status: DC

## 2017-10-30 ENCOUNTER — Encounter: Payer: Self-pay | Admitting: Gastroenterology

## 2017-11-02 ENCOUNTER — Other Ambulatory Visit: Payer: Self-pay

## 2017-11-02 MED ORDER — ATORVASTATIN CALCIUM 10 MG PO TABS: 10.0000 mg | ORAL_TABLET | Freq: Every day | ORAL | 5 refills | Status: DC

## 2017-11-27 ENCOUNTER — Encounter: Payer: Self-pay | Admitting: Family Medicine

## 2017-12-07 ENCOUNTER — Other Ambulatory Visit: Payer: Self-pay

## 2017-12-07 MED ORDER — BENAZEPRIL HCL 5 MG PO TABS: 5.0000 mg | ORAL_TABLET | Freq: Every day | ORAL | 3 refills | Status: DC

## 2018-01-12 ENCOUNTER — Encounter: Payer: Self-pay | Admitting: Gastroenterology

## 2018-01-12 ENCOUNTER — Ambulatory Visit (INDEPENDENT_AMBULATORY_CARE_PROVIDER_SITE_OTHER): Payer: 59 | Admitting: Family Medicine

## 2018-01-12 ENCOUNTER — Encounter: Payer: Self-pay | Admitting: Family Medicine

## 2018-01-12 VITALS — BP 110/86 | HR 80 | Temp 97.8°F | Ht 68.5 in | Wt 202.6 lb

## 2018-01-12 DIAGNOSIS — F325 Major depressive disorder, single episode, in full remission: Secondary | ICD-10-CM | POA: Diagnosis not present

## 2018-01-12 DIAGNOSIS — N401 Enlarged prostate with lower urinary tract symptoms: Secondary | ICD-10-CM | POA: Diagnosis not present

## 2018-01-12 DIAGNOSIS — H9201 Otalgia, right ear: Secondary | ICD-10-CM | POA: Diagnosis not present

## 2018-01-12 DIAGNOSIS — R351 Nocturia: Secondary | ICD-10-CM | POA: Diagnosis not present

## 2018-01-12 DIAGNOSIS — G250 Essential tremor: Secondary | ICD-10-CM | POA: Diagnosis not present

## 2018-01-12 DIAGNOSIS — Z0001 Encounter for general adult medical examination with abnormal findings: Secondary | ICD-10-CM

## 2018-01-12 DIAGNOSIS — H9311 Tinnitus, right ear: Secondary | ICD-10-CM | POA: Diagnosis not present

## 2018-01-12 DIAGNOSIS — I519 Heart disease, unspecified: Secondary | ICD-10-CM | POA: Diagnosis not present

## 2018-01-12 DIAGNOSIS — E785 Hyperlipidemia, unspecified: Secondary | ICD-10-CM | POA: Diagnosis not present

## 2018-01-12 DIAGNOSIS — E119 Type 2 diabetes mellitus without complications: Secondary | ICD-10-CM | POA: Diagnosis not present

## 2018-01-12 DIAGNOSIS — Z79899 Other long term (current) drug therapy: Secondary | ICD-10-CM

## 2018-01-12 DIAGNOSIS — I5189 Other ill-defined heart diseases: Secondary | ICD-10-CM

## 2018-01-12 LAB — COMPREHENSIVE METABOLIC PANEL
ALBUMIN: 4.4 g/dL (ref 3.5–5.2)
ALK PHOS: 91 U/L (ref 39–117)
ALT: 27 U/L (ref 0–53)
AST: 24 U/L (ref 0–37)
BUN: 15 mg/dL (ref 6–23)
CALCIUM: 9.3 mg/dL (ref 8.4–10.5)
CHLORIDE: 102 meq/L (ref 96–112)
CO2: 31 mEq/L (ref 19–32)
Creatinine, Ser: 1.05 mg/dL (ref 0.40–1.50)
GFR: 76.08 mL/min (ref >60.00)
Glucose, Bld: 112 mg/dL — ABNORMAL HIGH (ref 70–99)
POTASSIUM: 4.6 meq/L (ref 3.5–5.1)
SODIUM: 140 meq/L (ref 135–145)
Total Bilirubin: 1 mg/dL (ref 0.2–1.2)
Total Protein: 6.9 g/dL (ref 6.0–8.3)

## 2018-01-12 LAB — LIPID PANEL
CHOLESTEROL: 131 mg/dL (ref 0–200)
HDL: 39.8 mg/dL (ref >39.00)
LDL CALC: 67 mg/dL (ref 0–99)
NonHDL: 91.27
TRIGLYCERIDES: 121 mg/dL (ref 0.0–149.0)
Total CHOL/HDL Ratio: 3
VLDL: 24.2 mg/dL (ref 0.0–40.0)

## 2018-01-12 LAB — CBC
HEMATOCRIT: 45.2 % (ref 39.0–52.0)
HEMOGLOBIN: 14.7 g/dL (ref 13.0–17.0)
MCHC: 32.4 g/dL (ref 30.0–36.0)
MCV: 85.8 fl (ref 78.0–100.0)
PLATELETS: 297 10*3/uL (ref 150.0–400.0)
RBC: 5.26 Mil/uL (ref 4.22–5.81)
RDW: 13.6 % (ref 11.5–15.5)
WBC: 6.5 10*3/uL (ref 4.0–10.5)

## 2018-01-12 LAB — VITAMIN B12: Vitamin B-12: 349 pg/mL (ref 211–911)

## 2018-01-12 LAB — PSA: PSA: 0.55 ng/mL (ref 0.10–4.00)

## 2018-01-12 LAB — HEMOGLOBIN A1C: Hgb A1c MFr Bld: 6.7 % — ABNORMAL HIGH (ref 4.6–6.5)

## 2018-01-12 NOTE — Patient Instructions (Addendum)
We will call you within a week or two about your referral to ENT. If you do not hear within 3 weeks, give [place] a call.   Please call GI for follow up- sorry again that we thought your last was in 2010  Happy to check in 6 months from now but definitely see me 1 year for a physical  Please stop by lab before you go

## 2018-01-12 NOTE — Progress Notes (Signed)
Phone: [phone]  Subjective:  Patient presents today for their annual physical. Chief complaint-noted.   See problem oriented charting- ROS- full  review of systems was completed and negative except for: ear pain, tinnitus, lightheadedness, tremors  The following were reviewed and entered/updated in epic: Past Medical History:  Diagnosis Date  . Diastolic dysfunction, left ventricle    GRADE 2  . Diverticulosis   . Essential tremor    BENIGN--   . History of kidney stones   . History of sepsis    SIRS post ureteroscopic stone extraction w/ stent placement 10-29-2013--  resolved  . Hyperlipidemia   . Hypertension   . OA (osteoarthritis)   . Retina disorder, left    LEFT EYE RETINAL BUBBLE (NO TEAR OR DETACHMENT)  PT IS SEEING DR Baird Cancer 03-11-2015 TO HAVE LASERED--  PT SEE FLOATERS  . Right knee meniscal tear   . Type 2 diabetes mellitus [facility])    Patient Active Problem List   Diagnosis Date Noted  . Diabetes mellitus type II, controlled ([place]) 05/11/2007    Priority: High  . Retinal tear 03/17/2015    Priority: Medium  . Major depression in full remission ([place]) 10/06/2014    Priority: Medium  . Anemia 10/06/2014    Priority: Medium  . Diastolic dysfunction 12/45/8099    Priority: Medium  . Essential tremor 09/06/2007    Priority: Medium  . Hyperlipidemia 05/11/2007    Priority: Medium  . RUQ pain 01/07/2015    Priority: Low  . UTI (urinary tract infection) 11/11/2013    Priority: Low  . History of herpes zoster 07/17/2009    Priority: Low  . Osteoarthritis 04/16/2008    Priority: Low  . BPH associated with nocturia 01/12/2018   Past Surgical History:  Procedure Laterality Date  . ANTERIOR CERVICAL DECOMP/DISCECTOMY FUSION  04-24-2008   C5  -- C6  . CARDIAC CATHETERIZATION  06-21-2010  DR Loralie Champagne   RCA 25%/ MINIMAL DISEASE LM, CX  . CARDIOVASCULAR STRESS TEST  06-10-2010   NORMAL NUCLEAR STUDY BUT  CLINICALL ABNORMAL (CHEST PAIN, ST ABNORMALITY,  HYPOTENSION)/   EF 61%  . CYSTOSCOPY WITH RETROGRADE PYELOGRAM, URETEROSCOPY AND STENT PLACEMENT Left 10/30/2013   Procedure: CYSTOSCOPY WITH RETROGRADE PYELOGRAM, URETEROSCOPY AND STENT PLACEMENT;  Surgeon: Molli Hazard, MD;  Location: [facility] [address];  Service: Urology;  Laterality: Left;  . HOLMIUM LASER APPLICATION Left 83/02/8249   Procedure: HOLMIUM LASER APPLICATION;  Surgeon: Molli Hazard, MD;  Location: [facility];  Service: Urology;  Laterality: Left;  . KNEE ARTHROSCOPY W/ DEBRIDEMENT Right X3  LAST ONE 12-31-2008  . KNEE ARTHROSCOPY WITH MEDIAL MENISECTOMY Right 03/13/2015   Procedure: RIGHT KNEE ARTHROSCOPY WITH PARTIAL MEDIAL MENISECTOMY AND CHONDROPLASTY ;  Surgeon: Susa Day, MD;  Location: [place];  Service: Orthopedics;  Laterality: Right;  . RETINAL DETACHMENT SURGERY Left 03-11-2015  . SHOULDER ARTHROSCOPY Right X2  LAST ONE 04-25-2006  . SHOULDER ARTHROSCOPY WITH ROTATOR CUFF REPAIR AND SUBACROMIAL DECOMPRESSION Left 05-20-2010   AND DEBRIDEMENT BICEP TENDON/ OPEN DISTAL CLAVICAL RESECTON  . TONSILLECTOMY  AGE 61  . TRANSTHORACIC ECHOCARDIOGRAM  07-21-2010   GRADE II DIASTOLIC DYSFUNCTION/  EF 55%/  MILD DILATED AORTIC ROOT  . VASECTOMY REVERSAL  1993    Family History  Problem Relation Age of Onset  . Arthritis Mother   . Hypertension Mother   . Hyperlipidemia Mother   . Hypertension Father   . Hyperlipidemia Father     Medications- reviewed  and updated Current Outpatient Medications  Medication Sig Dispense Refill  . aspirin 81 MG chewable tablet Chew 81 mg by mouth daily.    Marland Kitchen atorvastatin (LIPITOR) 10 MG tablet Take 1 tablet (10 mg total) daily by mouth. 30 tablet 5  . benazepril (LOTENSIN) 5 MG tablet Take 1 tablet (5 mg total) by mouth daily. 30 tablet 3  . diazepam (VALIUM) 5 MG tablet TAKE 1 TABLET BY MOUTH EVERY 12 HOURS AS NEEDED 30 tablet 1  . DULoxetine (CYMBALTA) 60 MG capsule Take 1  capsule (60 mg total) by mouth daily. 60 capsule 5  . metFORMIN (GLUCOPHAGE-XR) 500 MG 24 hr tablet TAKE 2 TABLETS BY MOUTH EVERY DAY WITH BREAKFAST (Patient taking differently: TAKE 1 TABLET BY MOUTH EVERY DAY WITH BREAKFAST) 60 tablet 3  . Multiple Vitamin (MULTIVITAMIN WITH MINERALS) TABS tablet Take 1 tablet by mouth once a week.      No current facility-administered medications for this visit.     Allergies-reviewed and updated Allergies  Allergen Reactions  . Celebrex [Celecoxib] Other (See Comments)    GI UPSET  . Clindamycin Hcl Other (See Comments)    GI UPSET  . Erythromycin Other (See Comments)    GI UPSET    Social History   Socioeconomic History  . Marital status: Married    Spouse name: None  . Number of children: None  . Years of education: None  . Highest education level: None  Social Needs  . Financial resource strain: None  . Food insecurity - worry: None  . Food insecurity - inability: None  . Transportation needs - medical: None  . Transportation needs - non-medical: None  Occupational History  . None  Tobacco Use  . Smoking status: Former Smoker    Packs/day: 1.00    Years: 20.00    Pack years: 20.00    Types: Cigarettes    Last attempt to quit: 12/26/2000    Years since quitting: 17.0  . Smokeless tobacco: Never Used  Substance and Sexual Activity  . Alcohol use: Yes    Alcohol/week: 1.8 oz    Types: 1 Cans of beer, 2 Standard drinks or equivalent per week    Comment: average one beer weekly  . Drug use: No  . Sexual activity: None  Other Topics Concern  . None  Social History Narrative   Wife has cancer (omentum- rare and tough to treat- will likely pass)   1 dog left      Regularly gives blood to red cross as he thinks he may not be able to help wife, but he can help others. Has also given platelets.     Objective: BP 110/86 (BP Location: Left Arm, Patient Position: Sitting, Cuff Size: Large)   Pulse 80   Temp 97.8 F (36.6 C)  (Oral)   Ht 5' 8.5" (1.74 m)   Wt 202 lb 9.6 oz (91.9 kg)   SpO2 97%   BMI 30.36 kg/m  Gen: NAD, resting comfortably HEENT: Mucous membranes are moist. Oropharynx normal Neck: no thyromegaly CV: RRR no murmurs rubs or gallops Lungs: CTAB no crackles, wheeze, rhonchi Abdomen: soft/nontender/nondistended/normal bowel sounds. No rebound or guarding.  Ext: no edema Skin: warm, dry Neuro: grossly normal, moves all extremities, PERRLA Rectal: normal tone, diffusely enlarged prostate, no masses or tenderness  Diabetic Foot Exam - Simple   Simple Foot Form Diabetic Foot exam was performed with the following findings:  Yes 01/12/2018  9:43 AM  Visual Inspection No deformities, no  ulcerations, no other skin breakdown bilaterally:  Yes Sensation Testing Intact to touch and monofilament testing bilaterally:  Yes Pulse Check Posterior Tibialis and Dorsalis pulse intact bilaterally:  Yes Comments    Assessment/Plan:  62 y.o. male presenting for annual physical.  Health Maintenance counseling: 1. Anticipatory guidance: Patient counseled regarding regular dental exams -q6 months, eye exams -yearly, wearing seatbelts.  2. Risk factor reduction:  Advised patient of need for regular exercise and diet rich and fruits and vegetables to reduce risk of heart attack and stroke. Exercise- last year was walking his dog regularly- he is keeping this up. Diet-goal from last visit was to trim his extra 3 lbs off that he had gained- wasnts to get back down into 190s on our scale.  Wt Readings from Last 3 Encounters:  01/12/18 202 lb 9.6 oz (91.9 kg)  09/11/17 201 lb 6.4 oz (91.4 kg)  06/01/17 199 lb 9.6 oz (90.5 kg)  3. Immunizations/screenings/ancillary studies- shingrix availability issues discussed Immunization History  Administered Date(s) Administered  . Influenza Split 09/30/2011, 09/17/2012  . Influenza Whole 09/18/2008  . Influenza,inj,Quad PF,6+ Mos 10/06/2014, 01/01/2016, 10/14/2016,  09/11/2017  . Pneumococcal Polysaccharide-23 04/17/2009, 01/07/2015  . Td 04/17/2009  4. Prostate cancer screening-   Prior trend has been low risk- update today. Low risk rectal exam today- BPH noted and does have nocturia Lab Results  Component Value Date   PSA 0.52 12/28/2016   PSA 0.46 01/05/2016   PSA 0.73 09/19/2014   5. Colon cancer screening - 10/10/2007  with 10 year follow up advised. It was erroneously abstracted by someone as 2010 date- I apologized for initial misinformation.  6. Skin cancer screening- no dermatologist. advised regular sunscreen use. Denies worrisome, changing, or new skin lesions.   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 Blew his nose to get rid of the pressure and felt some pain at that time- heard a pop and hissing noise in right ear- has noted a low level constant hissing sense then and can feel some pressure. Will refer to ENT for their opinion given acute start and persistent nature- TM normal on my exam  Diabetes- has been controlled on once daily metformin. On benazepril for renal protection  HLD- has bene controlled reasonably well on atorvastatin 10mg .   Anemia- none on last check- will check b12 given long term metformin use. Donates platelets and denies anemia there. anemia could also be from donating blood  Diastolic dysfunction- winded with intense activity only  Essential tremor- multiple medicatoin trials and has seen urology. Beta blockers did not help. Valium gives him some relief. Slightly worse with time  Tennis elbow from last visit- was doing icing/stretching- states has healed up  Major depression in full remission ([place]) Depression- phq9 of 4. Remains on cymbalta 60mg - doing reasonably well. No SI.  Still with stress in caring for wife with omentum cancer that is incurable (over 4 years and 63 chemo)    Lab/Order  associations: Encounter for preventative adult health care exam with abnormal findings - Plan: CBC, Comprehensive metabolic panel, Lipid panel, PSA, Hemoglobin A1c, Vitamin B12, Ambulatory referral to ENT  Hyperlipidemia, unspecified hyperlipidemia type - Plan: CBC, Comprehensive metabolic panel, Lipid panel  Controlled type 2 diabetes mellitus without complication, without long-term current use of insulin ([facility]) - Plan: CBC, Comprehensive metabolic panel, Lipid panel, Hemoglobin A1c  BPH associated with nocturia - Plan: PSA  Tinnitus of right ear - Plan: Ambulatory referral to ENT  Right ear pain - Plan: Ambulatory referral to ENT  High risk medication use - Plan: Vitamin B12  Return precautions advised.  Garret Reddish, MD

## 2018-01-12 NOTE — Assessment & Plan Note (Signed)
Depression- phq9 of 4. Remains on cymbalta 60mg - doing reasonably well. No SI.  Still with stress in caring for wife with omentum cancer that is incurable (over 4 years and 38 chemo)

## 2018-01-21 ENCOUNTER — Encounter: Payer: Self-pay | Admitting: Family Medicine

## 2018-01-22 DIAGNOSIS — H9313 Tinnitus, bilateral: Secondary | ICD-10-CM | POA: Diagnosis not present

## 2018-01-22 DIAGNOSIS — H903 Sensorineural hearing loss, bilateral: Secondary | ICD-10-CM | POA: Diagnosis not present

## 2018-01-22 DIAGNOSIS — H9311 Tinnitus, right ear: Secondary | ICD-10-CM | POA: Diagnosis not present

## 2018-01-30 ENCOUNTER — Other Ambulatory Visit: Payer: Self-pay

## 2018-01-30 ENCOUNTER — Encounter: Payer: Self-pay | Admitting: Family Medicine

## 2018-01-30 ENCOUNTER — Ambulatory Visit (AMBULATORY_SURGERY_CENTER): Payer: Self-pay

## 2018-01-30 VITALS — Ht 69.0 in | Wt 200.0 lb

## 2018-01-30 DIAGNOSIS — Z1211 Encounter for screening for malignant neoplasm of colon: Secondary | ICD-10-CM

## 2018-01-30 MED ORDER — NA SULFATE-K SULFATE-MG SULF 17.5-3.13-1.6 GM/177ML PO SOLN: 1.0000 | Freq: Once | ORAL | 0 refills | Status: AC

## 2018-01-30 NOTE — Progress Notes (Signed)
Denies allergies to eggs or soy products. Denies complication of anesthesia or sedation. Denies use of weight loss medication. Denies use of O2.   Emmi instructions declined.  

## 2018-01-31 ENCOUNTER — Other Ambulatory Visit: Payer: Self-pay

## 2018-01-31 ENCOUNTER — Encounter: Payer: Self-pay | Admitting: Gastroenterology

## 2018-01-31 MED ORDER — METFORMIN HCL ER 500 MG PO TB24: 500.0000 mg | ORAL_TABLET | Freq: Every day | ORAL | 1 refills | Status: DC

## 2018-02-13 ENCOUNTER — Encounter: Payer: Self-pay | Admitting: Gastroenterology

## 2018-02-13 ENCOUNTER — Ambulatory Visit (AMBULATORY_SURGERY_CENTER): Payer: 59 | Admitting: Gastroenterology

## 2018-02-13 ENCOUNTER — Other Ambulatory Visit: Payer: Self-pay

## 2018-02-13 VITALS — BP 113/65 | HR 68 | Temp 98.2°F | Resp 11 | Ht 69.0 in | Wt 200.0 lb

## 2018-02-13 DIAGNOSIS — K5730 Diverticulosis of large intestine without perforation or abscess without bleeding: Secondary | ICD-10-CM | POA: Diagnosis not present

## 2018-02-13 DIAGNOSIS — D120 Benign neoplasm of cecum: Secondary | ICD-10-CM

## 2018-02-13 DIAGNOSIS — Z1211 Encounter for screening for malignant neoplasm of colon: Secondary | ICD-10-CM | POA: Diagnosis present

## 2018-02-13 MED ORDER — SODIUM CHLORIDE 0.9 % IV SOLN: 500.0000 mL | Freq: Once | INTRAVENOUS | Status: DC

## 2018-02-13 NOTE — Progress Notes (Signed)
Called to room to assist during endoscopic procedure.  Patient ID and intended procedure confirmed with present staff. Received instructions for my participation in the procedure from the performing physician.  

## 2018-02-13 NOTE — Patient Instructions (Signed)
YOU HAD AN ENDOSCOPIC PROCEDURE TODAY AT THE [facility]:   Refer to the procedure report that was given to you for any specific questions about what was found during the examination.  If the procedure report does not answer your questions, please call your gastroenterologist to clarify.  If you requested that your care partner not be given the details of your procedure findings, then the procedure report has been included in a sealed envelope for you to review at your convenience later.  YOU SHOULD EXPECT: Some feelings of bloating in the abdomen. Passage of more gas than usual.  Walking can help get rid of the air that was put into your GI tract during the procedure and reduce the bloating. If you had a lower endoscopy (such as a colonoscopy or flexible sigmoidoscopy) you may notice spotting of blood in your stool or on the toilet paper. If you underwent a bowel prep for your procedure, you may not have a normal bowel movement for a few days.  Please Note:  You might notice some irritation and congestion in your nose or some drainage.  This is from the oxygen used during your procedure.  There is no need for concern and it should clear up in a day or so.  SYMPTOMS TO REPORT IMMEDIATELY:   Following lower endoscopy (colonoscopy or flexible sigmoidoscopy):  Excessive amounts of blood in the stool  Significant tenderness or worsening of abdominal pains  Swelling of the abdomen that is new, acute  Fever of 100F or higher    For urgent or emergent issues, a gastroenterologist can be reached at any hour by calling [phone].   DIET:  We do recommend a small meal at first, but then you may proceed to your regular diet.  Drink plenty of fluids but you should avoid alcoholic beverages for 24 hours.  ACTIVITY:  You should plan to take it easy for the rest of today and you should NOT DRIVE or use heavy machinery until tomorrow (because of the sedation medicines used during the test).     FOLLOW UP: Our staff will call the number listed on your records the next business day following your procedure to check on you and address any questions or concerns that you may have regarding the information given to you following your procedure. If we do not reach you, we will leave a message.  However, if you are feeling well and you are not experiencing any problems, there is no need to return our call.  We will assume that you have returned to your regular daily activities without incident.  If any biopsies were taken you will be contacted by phone or by letter within the next 1-3 weeks.  Please call us at [phone] if you have not heard about the biopsies in 3 weeks.    SIGNATURES/CONFIDENTIALITY: You and/or your care partner have signed paperwork which will be entered into your electronic medical record.  These signatures attest to the fact that that the information above on your After Visit Summary has been reviewed and is understood.  Full responsibility of the confidentiality of this discharge information lies with you and/or your care-partner.   Resume medications. Information given on polyps and diverticulosis. 

## 2018-02-13 NOTE — Progress Notes (Signed)
Spontaneous respirations throughout. VSS. Resting comfortably. To PACU on room air. Report to  RN. 

## 2018-02-13 NOTE — Op Note (Signed)
[place] Patient Name: Isaiah Dunn Procedure Date: 02/13/2018 10:37 AM MRN: [id number] Endoscopist: Milus Banister , MD Age: 62 Referring MD:  Date of Birth: [birth year] Gender: Male Account #: [id number] Procedure:                Colonoscopy Indications:              Screening for colorectal malignant neoplasm Medicines:                Monitored Anesthesia Care Procedure:                Pre-Anesthesia Assessment:                           - Prior to the procedure, a History and Physical                            was performed, and patient medications and                            allergies were reviewed. The patient's tolerance of                            previous anesthesia was also reviewed. The risks                            and benefits of the procedure and the sedation                            options and risks were discussed with the patient.                            All questions were answered, and informed consent                            was obtained. Prior Anticoagulants: The patient has                            taken no previous anticoagulant or antiplatelet                            agents. ASA Grade Assessment: II - A patient with                            mild systemic disease. After reviewing the risks                            and benefits, the patient was deemed in                            satisfactory condition to undergo the procedure.                           After obtaining informed consent, the colonoscope  was passed under direct vision. Throughout the                            procedure, the patient's blood pressure, pulse, and                            oxygen saturations were monitored continuously. The                            Colonoscope was introduced through the anus and                            advanced to the the cecum, identified by                            appendiceal orifice and  ileocecal valve. The                            colonoscopy was performed without difficulty. The                            patient tolerated the procedure well. The quality                            of the bowel preparation was good. The ileocecal                            valve, appendiceal orifice, and rectum were                            photographed. Scope In: 10:48:02 AM Scope Out: 11:01:27 AM Scope Withdrawal Time: 0 hours 8 minutes 34 seconds  Total Procedure Duration: 0 hours 13 minutes 25 seconds  Findings:                 A 2 mm polyp was found in the cecum. The polyp was                            sessile. The polyp was removed with a cold snare.                            Resection and retrieval were complete.                           Multiple small-mouthed diverticula were found in                            the entire colon.                           The exam was otherwise without abnormality on                            direct and retroflexion views. Complications:  No immediate complications. Estimated blood loss:                            None. Estimated Blood Loss:     Estimated blood loss: none. Impression:               - One 2 mm polyp in the cecum, removed with a cold                            snare. Resected and retrieved.                           - Diverticulosis in the entire examined colon.                           - The examination was otherwise normal on direct                            and retroflexion views. Recommendation:           - Patient has a contact number available for                            emergencies. The signs and symptoms of potential                            delayed complications were discussed with the                            patient. Return to normal activities tomorrow.                            Written discharge instructions were provided to the                            patient.                            - Resume previous diet.                           - Continue present medications.                           You will receive a letter within 2-3 weeks with the                            pathology results and my final recommendations.                           If the polyp(s) is proven to be 'pre-cancerous' on                            pathology, you will need repeat colonoscopy in 5  years. If the polyp(s) is NOT 'precancerous' on                            pathology then you should repeat colon cancer                            screening in 10 years with colonoscopy without need                            for colon cancer screening by any method prior to                            then (including stool testing). Milus Banister, MD 02/13/2018 11:04:54 AM This report has been signed electronically.

## 2018-02-14 ENCOUNTER — Telehealth: Payer: Self-pay

## 2018-02-14 ENCOUNTER — Telehealth: Payer: Self-pay | Admitting: *Deleted

## 2018-02-14 NOTE — Telephone Encounter (Signed)
  Follow up Call-  Call back number 02/13/2018  Post procedure Call Back phone  # [phone]  Permission to leave phone message Yes  Some recent data might be hidden     Patient questions:  Do you have a fever, pain , or abdominal swelling? No. Pain Score  0 *  Have you tolerated food without any problems? Yes.    Have you been able to return to your normal activities? Yes.    Do you have any questions about your discharge instructions: Diet   No. Medications  No. Follow up visit  No.  Do you have questions or concerns about your Care? No.  Actions: * If pain score is 4 or above: No action needed, pain <4.

## 2018-02-14 NOTE — Telephone Encounter (Signed)
NO ANSWER, MESSAGE LEFT FOR PATIENT. 

## 2018-02-19 ENCOUNTER — Encounter: Payer: Self-pay | Admitting: Family Medicine

## 2018-02-19 ENCOUNTER — Encounter: Payer: Self-pay | Admitting: Gastroenterology

## 2018-02-19 DIAGNOSIS — Z86010 Personal history of colonic polyps: Secondary | ICD-10-CM | POA: Insufficient documentation

## 2018-02-20 ENCOUNTER — Other Ambulatory Visit: Payer: Self-pay

## 2018-02-20 ENCOUNTER — Encounter: Payer: Self-pay | Admitting: Family Medicine

## 2018-02-20 MED ORDER — DIAZEPAM 5 MG PO TABS: 5.0000 mg | ORAL_TABLET | Freq: Two times a day (BID) | ORAL | 1 refills | Status: DC | PRN

## 2018-04-12 ENCOUNTER — Other Ambulatory Visit: Payer: Self-pay | Admitting: *Deleted

## 2018-04-12 MED ORDER — BENAZEPRIL HCL 5 MG PO TABS: 5.0000 mg | ORAL_TABLET | Freq: Every day | ORAL | 3 refills | Status: DC

## 2018-05-04 ENCOUNTER — Other Ambulatory Visit: Payer: Self-pay | Admitting: Family Medicine

## 2018-05-04 NOTE — Telephone Encounter (Signed)
Please Advise

## 2018-05-08 ENCOUNTER — Other Ambulatory Visit: Payer: Self-pay

## 2018-05-08 MED ORDER — ATORVASTATIN CALCIUM 10 MG PO TABS: 10.0000 mg | ORAL_TABLET | Freq: Every day | ORAL | 5 refills | Status: DC

## 2018-08-04 ENCOUNTER — Other Ambulatory Visit: Payer: Self-pay | Admitting: Family Medicine

## 2018-08-17 ENCOUNTER — Other Ambulatory Visit: Payer: Self-pay | Admitting: Family Medicine

## 2018-09-06 ENCOUNTER — Other Ambulatory Visit: Payer: Self-pay

## 2018-09-06 MED ORDER — BENAZEPRIL HCL 5 MG PO TABS: 5.0000 mg | ORAL_TABLET | Freq: Every day | ORAL | 3 refills | Status: DC

## 2018-09-29 ENCOUNTER — Other Ambulatory Visit: Payer: Self-pay | Admitting: Family Medicine

## 2018-10-04 ENCOUNTER — Other Ambulatory Visit: Payer: Self-pay

## 2018-10-04 MED ORDER — DULOXETINE HCL 60 MG PO CPEP: 60.0000 mg | ORAL_CAPSULE | Freq: Every day | ORAL | 5 refills | Status: DC

## 2018-10-21 ENCOUNTER — Other Ambulatory Visit: Payer: Self-pay | Admitting: Family Medicine

## 2018-10-23 ENCOUNTER — Ambulatory Visit (INDEPENDENT_AMBULATORY_CARE_PROVIDER_SITE_OTHER): Payer: 59 | Admitting: Family Medicine

## 2018-10-23 ENCOUNTER — Encounter: Payer: Self-pay | Admitting: Family Medicine

## 2018-10-23 VITALS — BP 112/80 | HR 72 | Ht 69.0 in | Wt 191.0 lb

## 2018-10-23 DIAGNOSIS — Z23 Encounter for immunization: Secondary | ICD-10-CM

## 2018-10-23 DIAGNOSIS — E785 Hyperlipidemia, unspecified: Secondary | ICD-10-CM | POA: Diagnosis not present

## 2018-10-23 DIAGNOSIS — G250 Essential tremor: Secondary | ICD-10-CM | POA: Diagnosis not present

## 2018-10-23 DIAGNOSIS — E119 Type 2 diabetes mellitus without complications: Secondary | ICD-10-CM

## 2018-10-23 DIAGNOSIS — Z636 Dependent relative needing care at home: Secondary | ICD-10-CM

## 2018-10-23 LAB — POCT GLYCOSYLATED HEMOGLOBIN (HGB A1C): Hemoglobin A1C: 5.8 % — AB (ref 4.0–5.6)

## 2018-10-23 LAB — MICROALBUMIN / CREATININE URINE RATIO
Creatinine,U: 202.9 mg/dL
Microalb Creat Ratio: 0.3 mg/g (ref 0.0–30.0)
Microalb, Ur: 0.7 mg/dL (ref 0.0–1.9)

## 2018-10-23 NOTE — Patient Instructions (Addendum)
Health Maintenance Due  Topic Date Due  . OPHTHALMOLOGY EXAM-scheduled in December- thanks for having them sned [place] results  02/13/2018  . HEMOGLOBIN A1C-today 07/12/2018  . INFLUENZA VACCINE-thanks for doing this today 07/26/2018   Stop benazepril given orthostatic symptoms (lightheadedness with standing)  January 21st in afternoon- can schedule for a physical. Dont have to be fasting as long as triglycerides under 400 which I suspect they will be  Please stop by lab before you go

## 2018-10-23 NOTE — Assessment & Plan Note (Signed)
S: Well controlled on atorvastatin 10 mg with last LDL in January at 67 A/P: stable- continue current medicaitons

## 2018-10-23 NOTE — Progress Notes (Signed)
Subjective:  Isaiah Dunn is a 62 y.o. year old very pleasant male patient who presents for/with See problem oriented charting ROS- No chest pain or shortness of breath. No headache or blurry vision. No hypoglycemia.    Past Medical History-  Patient Active Problem List   Diagnosis Date Noted  . Diabetes mellitus type II, controlled ([place]) 05/11/2007    Priority: High  . History of adenomatous polyp of colon 02/19/2018    Priority: Medium  . BPH associated with nocturia 01/12/2018    Priority: Medium  . Retinal tear 03/17/2015    Priority: Medium  . Major depression in full remission ([place]) 10/06/2014    Priority: Medium  . Anemia 10/06/2014    Priority: Medium  . Diastolic dysfunction 31/49/7026    Priority: Medium  . Essential tremor 09/06/2007    Priority: Medium  . Hyperlipidemia 05/11/2007    Priority: Medium  . RUQ pain 01/07/2015    Priority: Low  . UTI (urinary tract infection) 11/11/2013    Priority: Low  . History of herpes zoster 07/17/2009    Priority: Low  . Osteoarthritis 04/16/2008    Priority: Low    Medications- reviewed and updated Current Outpatient Medications  Medication Sig Dispense Refill  . atorvastatin (LIPITOR) 10 MG tablet TAKE 1 TABLET BY MOUTH EVERY DAY 30 tablet 5  . benazepril (LOTENSIN) 5 MG tablet Take 1 tablet (5 mg total) by mouth daily. 90 tablet 3  . diazepam (VALIUM) 5 MG tablet TAKE ONE TABLET BY MOUTH EVERY 12 HOURS AS NEEDED 30 tablet 1  . DULoxetine (CYMBALTA) 60 MG capsule Take 1 capsule (60 mg total) by mouth daily. 60 capsule 5  . metFORMIN (GLUCOPHAGE-XR) 500 MG 24 hr tablet TAKE 1 TABLET BY MOUTH EVERY DAY WITH BREAKFAST 90 tablet 1  . OVER THE COUNTER MEDICATION B-12 Vitamin 1000 mcg one tablet daily.     Objective: BP 112/80 (BP Location: Left Arm, Patient Position: Sitting, Cuff Size: Normal)   Pulse 72   Ht 5\' 9"  (1.753 m)   Wt 191 lb (86.6 kg)   SpO2 99%   BMI 28.21 kg/m  Gen: NAD, resting comfortably CV: RRR  no murmurs rubs or gallops Lungs: CTAB no crackles, wheeze, rhonchi Abdomen: soft/nontender/nondistended/normal bowel sounds. .  Ext: no edema Skin: warm, dry  Assessment/Plan:  Other notes: 1.  Hearing loss noted in January-referred to ENT- saw Dr. Lucia Gaskins- was told just tinnitus- has been doing better lately 2.  Anemia- donates platelets and denies any anemia there.  Anemia could be from donations.  B12 normal last visit-checked because of metformin use (he is taking some b12 and has found that helpful). Update next labs 3.  Caregiver burden- Wife still dealing with omentum cancer that is incurable and that is a stressor for him. Follows at [facility] with gynecological oncology- is on different chemotherapy regimen recently- has been really hard on her- but seems to be working. All things considered- patient is doing INCREDIBLY well- has improved lifestyle despite his many stressors.   Diabetes mellitus type II, controlled (Isaiah Dunn) S:  controlled on metformin once a day.  On benazepril for renal protection.  Patient has lost 9 pounds from last visit   He states working form home - very active caring for wife, chores around house. Not able to do as long as walks due to dog not doing well.  Lab Results  Component Value Date   HGBA1C 5.8 (A) 10/23/2018   HGBA1C 6.7 (H) 01/12/2018  HGBA1C 6.5 09/11/2017   A/P: fantastic  A1c control  will continue current medications. If a1c gets any lower may be able to reduce regimen.   Stop benazepril given orthostatic symptoms (lightheadedness with standing). Get urine microalbumin/cr ratio  Hyperlipidemia S: Well controlled on atorvastatin 10 mg with last LDL in January at 67 A/P: stable- continue current medicaitons  Essential tremor S: I want to clarify from last note- has seen neurology in the past not urology for essential tremor.  Multiple medication trials in the past.  Value has been the only thing it is giving him some relief.  No help with  beta-blockers.  Slightly worse with time. A/P: stable- continue to monitor   Return in about 12 weeks (around 01/15/2019) for physical.  Lab/Order associations: Controlled type 2 diabetes mellitus without complication, without long-term current use of insulin ([place]) - Plan: POCT glycosylated hemoglobin (Hb A1C), Microalbumin / creatinine urine ratio  Need for influenza vaccination - Plan: Flu Vaccine QUAD 36+ mos IM  Hyperlipidemia, unspecified hyperlipidemia type  Essential tremor  Return precautions advised.  Garret Reddish, MD

## 2018-10-23 NOTE — Assessment & Plan Note (Addendum)
S:  controlled on metformin once a day.  On benazepril for renal protection.  Patient has lost 9 pounds from last visit   He states working form home - very active caring for wife, chores around house. Not able to do as long as walks due to dog not doing well.  Lab Results  Component Value Date   HGBA1C 5.8 (A) 10/23/2018   HGBA1C 6.7 (H) 01/12/2018   HGBA1C 6.5 09/11/2017   A/P: fantastic  A1c control  will continue current medications. If a1c gets any lower may be able to reduce regimen.   Stop benazepril given orthostatic symptoms (lightheadedness with standing). Get urine microalbumin/cr ratio

## 2018-10-23 NOTE — Assessment & Plan Note (Signed)
S: I want to clarify from last note- has seen neurology in the past not urology for essential tremor.  Multiple medication trials in the past.  Value has been the only thing it is giving him some relief.  No help with beta-blockers.  Slightly worse with time. A/P: stable- continue to monitor

## 2018-12-02 ENCOUNTER — Other Ambulatory Visit: Payer: Self-pay | Admitting: Family Medicine

## 2018-12-05 DIAGNOSIS — H2513 Age-related nuclear cataract, bilateral: Secondary | ICD-10-CM | POA: Diagnosis not present

## 2018-12-05 DIAGNOSIS — E119 Type 2 diabetes mellitus without complications: Secondary | ICD-10-CM | POA: Diagnosis not present

## 2018-12-05 DIAGNOSIS — H33312 Horseshoe tear of retina without detachment, left eye: Secondary | ICD-10-CM | POA: Diagnosis not present

## 2018-12-05 LAB — HM DIABETES EYE EXAM

## 2018-12-18 ENCOUNTER — Encounter: Payer: Self-pay | Admitting: Family Medicine

## 2019-01-24 ENCOUNTER — Encounter: Payer: 59 | Admitting: Family Medicine

## 2019-02-07 ENCOUNTER — Other Ambulatory Visit: Payer: Self-pay | Admitting: Family Medicine

## 2019-02-08 NOTE — Telephone Encounter (Signed)
Next Office Visit:02/11/2019  Okay for refill?   Please advise

## 2019-02-08 NOTE — Telephone Encounter (Signed)
Pt requesting refill.  Has an appointment on Monday 02/11/19.

## 2019-02-09 NOTE — Telephone Encounter (Signed)
I refilled medication today

## 2019-02-11 ENCOUNTER — Encounter: Payer: Self-pay | Admitting: Family Medicine

## 2019-02-11 ENCOUNTER — Ambulatory Visit (INDEPENDENT_AMBULATORY_CARE_PROVIDER_SITE_OTHER): Payer: 59 | Admitting: Family Medicine

## 2019-02-11 VITALS — BP 110/70 | HR 74 | Temp 98.1°F | Ht 67.5 in | Wt 196.4 lb

## 2019-02-11 DIAGNOSIS — Z6830 Body mass index (BMI) 30.0-30.9, adult: Secondary | ICD-10-CM

## 2019-02-11 DIAGNOSIS — Z0000 Encounter for general adult medical examination without abnormal findings: Secondary | ICD-10-CM

## 2019-02-11 DIAGNOSIS — Z125 Encounter for screening for malignant neoplasm of prostate: Secondary | ICD-10-CM | POA: Diagnosis not present

## 2019-02-11 DIAGNOSIS — E785 Hyperlipidemia, unspecified: Secondary | ICD-10-CM

## 2019-02-11 DIAGNOSIS — E119 Type 2 diabetes mellitus without complications: Secondary | ICD-10-CM

## 2019-02-11 DIAGNOSIS — Z79899 Other long term (current) drug therapy: Secondary | ICD-10-CM | POA: Diagnosis not present

## 2019-02-11 DIAGNOSIS — Z23 Encounter for immunization: Secondary | ICD-10-CM | POA: Diagnosis not present

## 2019-02-11 LAB — URINALYSIS
Bilirubin Urine: NEGATIVE
HGB URINE DIPSTICK: NEGATIVE
Leukocytes,Ua: NEGATIVE
Nitrite: NEGATIVE
Specific Gravity, Urine: >=1.03 — AB (ref 1.000–1.030)
Total Protein, Urine: NEGATIVE
Urine Glucose: NEGATIVE
Urobilinogen, UA: 0.2 (ref 0.0–1.0)
pH: 5 (ref 5.0–8.0)

## 2019-02-11 LAB — CBC
HCT: 40.8 % (ref 39.0–52.0)
Hemoglobin: 13.4 g/dL (ref 13.0–17.0)
MCHC: 32.8 g/dL (ref 30.0–36.0)
MCV: 85.7 fl (ref 78.0–100.0)
Platelets: 278 10*3/uL (ref 150.0–400.0)
RBC: 4.75 Mil/uL (ref 4.22–5.81)
RDW: 13.9 % (ref 11.5–15.5)
WBC: 8 10*3/uL (ref 4.0–10.5)

## 2019-02-11 LAB — LIPID PANEL
CHOL/HDL RATIO: 3
Cholesterol: 143 mg/dL (ref 0–200)
HDL: 43.4 mg/dL (ref >39.00)
LDL Cholesterol: 76 mg/dL (ref 0–99)
NonHDL: 99.86
Triglycerides: 118 mg/dL (ref 0.0–149.0)
VLDL: 23.6 mg/dL (ref 0.0–40.0)

## 2019-02-11 LAB — COMPREHENSIVE METABOLIC PANEL
ALT: 24 U/L (ref 0–53)
AST: 23 U/L (ref 0–37)
Albumin: 4.3 g/dL (ref 3.5–5.2)
Alkaline Phosphatase: 79 U/L (ref 39–117)
BUN: 24 mg/dL — ABNORMAL HIGH (ref 6–23)
CO2: 29 mEq/L (ref 19–32)
Calcium: 9.6 mg/dL (ref 8.4–10.5)
Chloride: 103 mEq/L (ref 96–112)
Creatinine, Ser: 1.02 mg/dL (ref 0.40–1.50)
GFR: 73.76 mL/min (ref >60.00)
Glucose, Bld: 79 mg/dL (ref 70–99)
Potassium: 4.5 mEq/L (ref 3.5–5.1)
SODIUM: 139 meq/L (ref 135–145)
Total Bilirubin: 1.2 mg/dL (ref 0.2–1.2)
Total Protein: 6.5 g/dL (ref 6.0–8.3)

## 2019-02-11 LAB — HEMOGLOBIN A1C: Hgb A1c MFr Bld: 6.4 % (ref 4.6–6.5)

## 2019-02-11 NOTE — Patient Instructions (Addendum)
Health Maintenance Due  Topic Date Due  . FOOT EXAM Done today 01/12/2019  Please schedule 2nd dose of Shingles vaccine in 2-5 months - at check out  Please stop by lab before you go If you do not have mychart- we will call you about results within 5 business days of [place] receiving them.  If you have mychart- we will send your results within 3 business days of [place] receiving them.  If abnormal or we want to clarify a result, we will call or mychart you to make sure you receive the message.  If you have questions or concerns or don't hear within 5-7 days, please send [place] a message or call us.

## 2019-02-11 NOTE — Progress Notes (Signed)
Phone: [phone]   Subjective:  Patient presents today for their annual physical. Chief complaint-noted.   See problem oriented charting- ROS- full  review of systems was completed and negative except for:  Tremors, cataract vision issues   BMI monitoring- elevated BMI noted: Body mass index is 30.31 kg/m. Encouraged need for healthy eating, regular exercise, weight loss.   BMI Metric Follow Up - 02/11/19 1347      BMI Metric Follow Up-Please document annually   BMI Metric Follow Up  Education provided       The following were reviewed and entered/updated in epic: Past Medical History:  Diagnosis Date  . Anemia   . Anxiety   . Cataract    Right eye  . Chronic kidney disease   . Depression   . Diastolic dysfunction, left ventricle    GRADE 2  . Diverticulosis   . Essential tremor    BENIGN--   . GERD (gastroesophageal reflux disease)   . History of kidney stones   . History of sepsis    SIRS post ureteroscopic stone extraction w/ stent placement 10-29-2013--  resolved  . Hyperlipidemia   . Hypertension   . Neuromuscular disorder ([place])   . OA (osteoarthritis)   . Retina disorder, left    LEFT EYE RETINAL BUBBLE (NO TEAR OR DETACHMENT)  PT IS SEEING DR Baird [facility] 03-11-2015 TO HAVE LASERED--  PT SEE FLOATERS  . Right knee meniscal tear   . Type 2 diabetes mellitus [facility])    Patient Active Problem List   Diagnosis Date Noted  . Diabetes mellitus type II, controlled ([place]) 05/11/2007    Priority: High  . History of adenomatous polyp of colon 02/19/2018    Priority: Medium  . BPH associated with nocturia 01/12/2018    Priority: Medium  . Retinal tear 03/17/2015    Priority: Medium  . Major depression in full remission ([place]) 10/06/2014    Priority: Medium  . Anemia 10/06/2014    Priority: Medium  . Diastolic dysfunction 42/59/5638    Priority: Medium  . Essential tremor 09/06/2007    Priority: Medium  . Hyperlipidemia 05/11/2007    Priority: Medium  . RUQ pain  01/07/2015    Priority: Low  . UTI (urinary tract infection) 11/11/2013    Priority: Low  . History of herpes zoster 07/17/2009    Priority: Low  . Osteoarthritis 04/16/2008    Priority: Low   Past Surgical History:  Procedure Laterality Date  . ANTERIOR CERVICAL DECOMP/DISCECTOMY FUSION  04-24-2008   C5  -- C6  . CARDIAC CATHETERIZATION  06-21-2010  DR Loralie Champagne   RCA 25%/ MINIMAL DISEASE LM, CX  . CARDIOVASCULAR STRESS TEST  06-10-2010   NORMAL NUCLEAR STUDY BUT  CLINICALL ABNORMAL (CHEST PAIN, ST ABNORMALITY, HYPOTENSION)/   EF 61%  . CYSTOSCOPY WITH RETROGRADE PYELOGRAM, URETEROSCOPY AND STENT PLACEMENT Left 10/30/2013   Procedure: CYSTOSCOPY WITH RETROGRADE PYELOGRAM, URETEROSCOPY AND STENT PLACEMENT;  Surgeon: Molli Hazard, MD;  Location: [facility];  Service: Urology;  Laterality: Left;  . HOLMIUM LASER APPLICATION Left 75/05/4331   Procedure: HOLMIUM LASER APPLICATION;  Surgeon: Molli Hazard, MD;  Location: [facility];  Service: Urology;  Laterality: Left;  . KNEE ARTHROSCOPY W/ DEBRIDEMENT Right X3  LAST ONE 12-31-2008  . KNEE ARTHROSCOPY WITH MEDIAL MENISECTOMY Right 03/13/2015   Procedure: RIGHT KNEE ARTHROSCOPY WITH PARTIAL MEDIAL MENISECTOMY AND CHONDROPLASTY ;  Surgeon: Susa Day, MD;  Location: [place];  Service: Orthopedics;  Laterality: Right;  . RETINAL DETACHMENT SURGERY Left 03-11-2015  . SHOULDER ARTHROSCOPY Right X2  LAST ONE 04-25-2006  . SHOULDER ARTHROSCOPY WITH ROTATOR CUFF REPAIR AND SUBACROMIAL DECOMPRESSION Left 05-20-2010   AND DEBRIDEMENT BICEP TENDON/ OPEN DISTAL CLAVICAL RESECTON  . TONSILLECTOMY  AGE 49  . TRANSTHORACIC ECHOCARDIOGRAM  07-21-2010   GRADE II DIASTOLIC DYSFUNCTION/  EF 55%/  MILD DILATED AORTIC ROOT  . VASECTOMY REVERSAL  1993    Family History  Problem Relation Age of Onset  . Arthritis Mother   . Hypertension Mother   . Hyperlipidemia Mother   .  Hypertension Father   . Hyperlipidemia Father   . Lung cancer Father   . Liver cancer Maternal Aunt   . Colon cancer Neg Hx   . Esophageal cancer Neg Hx   . Pancreatic cancer Neg Hx   . Rectal cancer Neg Hx   . Stomach cancer Neg Hx     Medications- reviewed and updated Current Outpatient Medications  Medication Sig Dispense Refill  . Ascorbic Acid (VITAMIN C) 500 MG CAPS Pt is taking every 2 days    . atorvastatin (LIPITOR) 10 MG tablet TAKE 1 TABLET BY MOUTH EVERY DAY 30 tablet 5  . diazepam (VALIUM) 5 MG tablet TAKE 1 TABLET BY MOUTH EVERY 12 HOURS AS NEEDED 30 tablet 3  . DULoxetine (CYMBALTA) 60 MG capsule TAKE 1 CAPSULE BY MOUTH EVERY DAY 90 capsule 3  . metFORMIN (GLUCOPHAGE-XR) 500 MG 24 hr tablet TAKE 1 TABLET BY MOUTH EVERY DAY WITH BREAKFAST 90 tablet 1  . OVER THE COUNTER MEDICATION B-12 Vitamin 1000 mcg one tablet daily.     No current facility-administered medications for this visit.     Allergies-reviewed and updated Allergies  Allergen Reactions  . Celebrex [Celecoxib] Other (See Comments)    GI UPSET  . Clindamycin Hcl Other (See Comments)    GI UPSET  . Erythromycin Other (See Comments)    GI UPSET    Social History   Social History Narrative   Wife has cancer (omentum- rare and tough to treat- will likely pass)   1 dog left      Regularly gives blood to [employer] as he thinks he may not be able to help wife, but he can help others. Has also given platelets.    Objective  Objective:  BP 110/70 (BP Location: Left Arm, Patient Position: Sitting, Cuff Size: Normal)   Pulse 74   Temp 98.1 F (36.7 C) (Oral)   Ht 5' 7.5" (1.715 m)   Wt 196 lb 6.4 oz (89.1 kg)   SpO2 98%   BMI 30.31 kg/m  Gen: NAD, resting comfortably HEENT: Mucous membranes are moist. Oropharynx normal Neck: no thyromegaly CV: RRR no murmurs rubs or gallops Lungs: CTAB no crackles, wheeze, rhonchi Abdomen: soft/nontender/nondistended/normal bowel sounds. No rebound or guarding.    Ext: no edema Skin: warm, dry Neuro: grossly normal, moves all extremities, PERRLA   Diabetic Foot Exam - Simple   Simple Foot Form Diabetic Foot exam was performed with the following findings:  Yes 02/11/2019  1:27 PM  Visual Inspection See comments:  Yes Sensation Testing Intact to touch and monofilament testing bilaterally:  Yes Pulse Check Posterior Tibialis and Dorsalis pulse intact bilaterally:  Yes Comments Patient stubbed toe last week and there is some discoloration in toe nail.        Assessment and Plan  63 y.o. male presenting for annual physical.  Health Maintenance counseling: 1. Anticipatory guidance: Patient  counseled regarding regular dental exams -q6 months, eye exams -yearly (new cataract found- thinking April or may for surgery- will see Dr. Lucita Ferrara he thinks),  avoiding smoking and second hand smoke , limiting alcohol to 2 beverages per day - not much lately- rare social .   2. Risk factor reduction:  Advised patient of need for regular exercise and diet rich and fruits and vegetables to reduce risk of heart attack and stroke. Exercise- tougher in the winter time.. Diet- Up a few lbs on home scales but starting to lose again- he got all the way to 189 after our last visit. Getting back on track.  Wt Readings from Last 3 Encounters:  02/11/19 196 lb 6.4 oz (89.1 kg)  10/23/18 191 lb (86.6 kg)  02/13/18 200 lb (90.7 kg)  3. Immunizations/screenings/ancillary studies-discussed Shingrix- opts in  Immunization History  Administered Date(s) Administered  . Influenza Split 09/30/2011, 09/17/2012  . Influenza Whole 09/18/2008  . Influenza,inj,Quad PF,6+ Mos 10/06/2014, 01/01/2016, 10/14/2016, 09/11/2017, 10/23/2018  . Pneumococcal Polysaccharide-23 04/17/2009, 01/07/2015  . Td 04/17/2009  . Zoster Recombinat (Shingrix) 02/11/2019  4. Prostate cancer screening- low risk PSA trend-BPH on prior exams.  Update PSA today. Lab Results  Component Value Date   PSA  0.55 01/12/2018   PSA 0.52 12/28/2016   PSA 0.46 01/05/2016   5. Colon cancer screening - February 13, 2018 with two to three year follow-up due to adenomatous polyp history per pateint 6. Skin cancer screening-no dermatologist.   Advised regular sunscreen use. Denies worrisome, changing, or new skin lesions.  7.  Former smoker-20 pack years-quit in 2002.  Will get urine to make sure no hematuri.  AAA scan planned at age 89  Status of chronic or acute concerns    #Caregiver burden- wife with omentum cancer that is incurable.  He is working from home to be there for his wife. She is not doing well with new chemo regimen- he is taking her to a visit this afternoon- hard on him.   #Diabetes S: Compliant with metformin 500 mg extended release once a day.  On benazepril for renal protection in the past but orthostatic so stopped.. A/P: likely Stable. Continue current medications. Update a1c  - if under 6 we are planning to stop the metformin.  - check b12 as low normal last year but taking daily now   #Hyperlipidemia S: Compliant with atorvastatin 10 mg A/P: hopeful Stable. Continue current medications.  Update lipids    #Essential tremor S: Valium has been only medication that has given him relief.  Multiple medication trials in the past.  Has seen neurology in the past. A/P: just refilled med last week. Stable- continue current medicine    #Major depression in full remission S: Patient doing well on Cymbalta 60 mg.  PHQ 9 today of1  A/P:  Stable. Continue current medications.    #Intermittent anemia-gives triple platelets - gets some anemia with this.   Return in about 6 months (around 08/12/2019) for follow up- or sooner if needed.  Lab/Order associations: cup of coffee, bowel of chex wheat in AM, boost at lunch   Preventative health care - Plan: CBC, Comprehensive metabolic panel, Hemoglobin A1c, Lipid panel, Urinalysis, PSA, Vitamin B12  Controlled type 2 diabetes mellitus without  complication, without long-term current use of insulin ([facility]) - Plan: CBC, Comprehensive metabolic panel, Hemoglobin A1c, Lipid panel  Hyperlipidemia, unspecified hyperlipidemia type - Plan: CBC, Comprehensive metabolic panel, Lipid panel, Urinalysis  Screening for prostate cancer - Plan:  PSA  BMI 30.0-30.9,adult  Need for shingles vaccine - Plan: Varicella-zoster vaccine IM  High risk medication use - Plan: Vitamin B12  Return precautions advised.  Garret Reddish, MD

## 2019-02-12 LAB — PSA: PSA: 0.53 ng/mL (ref 0.10–4.00)

## 2019-02-13 ENCOUNTER — Encounter: Payer: Self-pay | Admitting: Family Medicine

## 2019-02-13 ENCOUNTER — Telehealth: Payer: Self-pay | Admitting: Family Medicine

## 2019-02-13 LAB — VITAMIN B12: Vitamin B-12: 880 pg/mL (ref 211–911)

## 2019-02-13 NOTE — Telephone Encounter (Signed)
See result note.  

## 2019-02-13 NOTE — Telephone Encounter (Unsigned)
Copied from [place] [id number]. Topic: Quick Communication - Lab Results (Clinic Use ONLY) >> Feb 13, 2019  2:49 PM Franco Collet, CMA wrote: Called patient to inform them of  lab results. When patient returns call, triage nurse may disclose results.

## 2019-02-20 ENCOUNTER — Other Ambulatory Visit: Payer: Self-pay | Admitting: Family Medicine

## 2019-03-12 ENCOUNTER — Encounter: Payer: Self-pay | Admitting: Family Medicine

## 2019-03-13 MED ORDER — DULOXETINE HCL 60 MG PO CPEP: ORAL_CAPSULE | ORAL | 0 refills | Status: DC

## 2019-03-18 ENCOUNTER — Encounter: Payer: Self-pay | Admitting: Family Medicine

## 2019-03-18 MED ORDER — ATORVASTATIN CALCIUM 10 MG PO TABS: 10.0000 mg | ORAL_TABLET | Freq: Every day | ORAL | 5 refills | Status: DC

## 2019-04-16 ENCOUNTER — Ambulatory Visit: Payer: Self-pay | Admitting: Family Medicine

## 2019-04-16 ENCOUNTER — Encounter: Payer: Self-pay | Admitting: Family Medicine

## 2019-04-16 ENCOUNTER — Ambulatory Visit (INDEPENDENT_AMBULATORY_CARE_PROVIDER_SITE_OTHER): Payer: 59 | Admitting: Family Medicine

## 2019-04-16 VITALS — BP 137/86 | HR 89 | Temp 98.6°F | Ht 67.5 in | Wt 190.0 lb

## 2019-04-16 DIAGNOSIS — S70261A Insect bite (nonvenomous), right hip, initial encounter: Secondary | ICD-10-CM

## 2019-04-16 DIAGNOSIS — W57XXXA Bitten or stung by nonvenomous insect and other nonvenomous arthropods, initial encounter: Secondary | ICD-10-CM

## 2019-04-16 DIAGNOSIS — L03319 Cellulitis of trunk, unspecified: Secondary | ICD-10-CM | POA: Diagnosis not present

## 2019-04-16 DIAGNOSIS — F4321 Adjustment disorder with depressed mood: Secondary | ICD-10-CM

## 2019-04-16 MED ORDER — DOXYCYCLINE HYCLATE 100 MG PO TABS: 100.0000 mg | ORAL_TABLET | Freq: Two times a day (BID) | ORAL | 0 refills | Status: AC

## 2019-04-16 NOTE — Telephone Encounter (Signed)
See note. Patient is scheduled for today at 10am for virtual visit

## 2019-04-16 NOTE — Telephone Encounter (Signed)
See visit note from today

## 2019-04-16 NOTE — Patient Instructions (Addendum)
Video visit

## 2019-04-16 NOTE — Telephone Encounter (Signed)
Forwarding to Dr. Yong Channel at [facility].

## 2019-04-16 NOTE — Telephone Encounter (Signed)
  Pt called in c/o having soreness, redness and a knot where he had a tick bite on Sunday.   He removed the tick.  It's was on his right waist line area.  Due to the COVID-19 pandemic I transferred him to [place] at Dr. Ansel Bong office so they could schedule him for a virtual visit.  See triage notes.   I sent these notes to Dr. Ansel Bong office.  Reason for Disposition . [1] Red or very tender (to touch) area AND [2] started over 24 hours after the bite  Answer Assessment - Initial Assessment Questions 1. TYPE of TICK: "Is it a wood tick or a deer tick?" If unsure, ask: "What size was the tick?" "Did it look more like a watermelon seed or a poppy seed?"      Sunday morning I felt something itching on my right waist.  I looked and it was a small tick.   It was a very tiny tick.   I don't know what kind it was maybe a deer tick.   I've noticed it now has redness and knots under the skin and they are itching.   I'm using anti-itch lotion my wife had (deceased).    2. LOCATION: "Where is the tick bite located?"      Right waist on right side.   I'm using Ibuprofen for the discomfort.    No fever. 3. ONSET: "How long do you think the tick was attached before you removed it?" (Hours or days)      Sunday.   There are 2 knots at the bite site.   4. TETANUS: "When was the last tetanus booster?"      I'm due for a tetanus this year.  I'm checking Mychart now.    5. PREGNANCY: "Is there any chance you are pregnant?" "When was your last menstrual period?"     N/A  Protocols used: TICK BITE-A-AH

## 2019-04-16 NOTE — Progress Notes (Signed)
Phone [phone]   Subjective:  Virtual visit via Video note. Chief complaint: Chief Complaint  Patient presents with  . skin irritation after tick bite    Tick was on R hip just below the waistband. He was working in the yard Saturday and found tick Sunday. He feels like he was able to completely remove the tick. Reports open wound and redness in 2 areas about 2" apart. There is some swelling/knot under the skin. Reports erythema and increased warmth. Has been using anti-itch ointment, SARNA . Reports some achiness and joint pain, mostly in the LEs. He has been taking IBU. Denies fever, bulls eye.     This visit type was conducted due to national recommendations for restrictions regarding the COVID-19 Pandemic (e.g. social distancing).  This format is felt to be most appropriate for this patient at this time balancing risks to patient and risks to population by having him in for in person visit.  No physical exam was performed (except for noted visual exam or audio findings with Telehealth visits).    Our team/I connected with Casimiro Needle on 04/16/19 at 10:00 AM EDT by a video enabled telemedicine application (doxy.me) and verified that I am speaking with the correct person using two identifiers.  Location patient: Home-O2 Location provider: [facility], office Persons participating in the virtual visit:  patient  Our team/I discussed the limitations of evaluation and management by telemedicine and the availability of in person appointments. In light of current covid-19 pandemic, patient also understands that we are trying to protect them by minimizing in office contact if at all possible.  The patient expressed consent for telemedicine visit and agreed to proceed. Patient understands insurance will be billed.   ROS- no new medications. Not systemically ill. Not immunosuppressed. No fever/chills.    Past Medical History-  Patient Active Problem List   Diagnosis Date Noted  .  Diabetes mellitus type II, controlled ([place]) 05/11/2007    Priority: High  . History of adenomatous polyp of colon 02/19/2018    Priority: Medium  . BPH associated with nocturia 01/12/2018    Priority: Medium  . Retinal tear 03/17/2015    Priority: Medium  . Major depression in full remission ([place]) 10/06/2014    Priority: Medium  . Anemia 10/06/2014    Priority: Medium  . Diastolic dysfunction 16/12/930    Priority: Medium  . Essential tremor 09/06/2007    Priority: Medium  . Hyperlipidemia 05/11/2007    Priority: Medium  . RUQ pain 01/07/2015    Priority: Low  . UTI (urinary tract infection) 11/11/2013    Priority: Low  . History of herpes zoster 07/17/2009    Priority: Low  . Osteoarthritis 04/16/2008    Priority: Low    Medications- reviewed and updated Current Outpatient Medications  Medication Sig Dispense Refill  . Ascorbic Acid (VITAMIN C) 500 MG CAPS Pt is taking every 2 days    . atorvastatin (LIPITOR) 10 MG tablet Take 1 tablet (10 mg total) by mouth daily. 30 tablet 5  . diazepam (VALIUM) 5 MG tablet TAKE 1 TABLET BY MOUTH EVERY 12 HOURS AS NEEDED 30 tablet 3  . DULoxetine (CYMBALTA) 60 MG capsule TAKE 1 CAPSULE BY MOUTH EVERY DAY 90 capsule 0  . metFORMIN (GLUCOPHAGE-XR) 500 MG 24 hr tablet TAKE 1 TABLET BY MOUTH EVERY DAY WITH BREAKFAST 90 tablet 1  . OVER THE COUNTER MEDICATION B-12 Vitamin 1000 mcg one tablet daily.    Marland Kitchen doxycycline (VIBRA-TABS) 100 MG tablet  Take 1 tablet (100 mg total) by mouth 2 (two) times daily for 7 days. 14 tablet 0   No current facility-administered medications for this visit.      Objective:  BP 137/86   Pulse 89   Temp 98.6 F (37 C)   Ht 5' 7.5" (1.715 m)   Wt 190 lb (86.2 kg)   BMI 29.32 kg/m  Gen: NAD, resting comfortably Lungs: nonlabored, normal respiratory rate  Skin: appears dry, on right hip there are 2 small open wounds under 5 mm- marked erythema around both areas- also some faint erythema in between-patient  admits to warmth between these areas     Assessment and Plan   #Tick bite/concern for cellulitis S: Patient noted a tick embedded just below his waist pain on the right hip on Sunday.  He was working in his yard on Saturday so thinks the tick was attached for over 24 hours-possibly 36 hours.  Tick was somewhat engorged.  He was able to completely remove the tick.  Since that time he has developed a small open wound at the site where he removed the tick but also about 2 to 3 inches behind it.  He notes some redness surrounding both open area-the posterior area he notes some swelling/a knot-like sensation under the skin.  Between these areas and surrounding them there is some warmth and mild erythema.  Also has some itch-has been using a Sarna cream.  He also has noted some achiness down into his legs but not diffuse myalgias.  He denies fever or bull's-eye lesion.  Ibuprofen seems to help with the joint discomfort.  He has had perhaps some mild nausea.  He notes the tick was very small but was not sure of the type of tick.  Can be a mild to moderate discomfort- it is affecting his sleep if he tries to lay on that side A/P: I do not have a strong suspicion for RMSF or Lyme disease.  I am concerned about a potential cellulitis or even a small abscess with the posterior lesion-I think it is reasonable to use doxycycline to provide coverage for cellulitis as well as tickborne disease.  We will treat for 7 days-patient knows if he is having worsening symptoms that he should contact us immediately-particularly if he gets more systemic symptoms.  #Grieving S: March 14th- wife passed after 5-year battle with omentum cancer.  He feels he is doing reasonably well-trying to do what he can to remain somewhat distracted while also actively engaging the grieving-he is doing hospice grief counseling thankfully.  He is workign aroudn the house more; Carrying dog for a walk (dog cant walk now), Riding stationary bike  3x a week, Wants to mountain bike  A/P: Patient is doing reasonably well-encouraged him to continue with grief counseling   Future Appointments  Date Time Provider [place]  08/14/2019  8:00 AM Marin Olp, MD [facility]   Lab/Order associations: Cellulitis of trunk, unspecified site of trunk  Tick bite of right hip, initial encounter  Grieving  Meds ordered this encounter  Medications  . doxycycline (VIBRA-TABS) 100 MG tablet    Sig: Take 1 tablet (100 mg total) by mouth 2 (two) times daily for 7 days.    Dispense:  14 tablet    Refill:  0    Return precautions advised.  Garret Reddish, MD

## 2019-05-02 ENCOUNTER — Telehealth (INDEPENDENT_AMBULATORY_CARE_PROVIDER_SITE_OTHER): Payer: 59 | Admitting: Family Medicine

## 2019-05-02 ENCOUNTER — Encounter: Payer: Self-pay | Admitting: Family Medicine

## 2019-05-02 VITALS — BP 130/79 | HR 74 | Temp 98.5°F | Ht 67.5 in | Wt 190.0 lb

## 2019-05-02 DIAGNOSIS — R21 Rash and other nonspecific skin eruption: Secondary | ICD-10-CM | POA: Diagnosis not present

## 2019-05-02 DIAGNOSIS — Z23 Encounter for immunization: Secondary | ICD-10-CM | POA: Diagnosis not present

## 2019-05-02 NOTE — Patient Instructions (Addendum)
Video visit

## 2019-05-02 NOTE — Progress Notes (Signed)
Phone [phone]   Subjective:  Virtual visit via Video note. Chief complaint: Chief Complaint  Patient presents with  . skin ulcers    This visit type was conducted due to national recommendations for restrictions regarding the COVID-19 Pandemic (e.g. social distancing).  This format is felt to be most appropriate for this patient at this time balancing risks to patient and risks to population by having him in for in person visit.  No physical exam was performed (except for noted visual exam or audio findings with Telehealth visits).    Our team/I connected with Casimiro Needle on 05/02/19 at  2:20 PM EDT by a video enabled telemedicine application (doxy.me) and verified that I am speaking with the correct person using two identifiers.  Location patient: Home-O2 Location provider: [facility], office Persons participating in the virtual visit:  patient  Our team/I discussed the limitations of evaluation and management by telemedicine and the availability of in person appointments. In light of current covid-19 pandemic, patient also understands that we are trying to protect them by minimizing in office contact if at all possible.  The patient expressed consent for telemedicine visit and agreed to proceed. Patient understands insurance will be billed.   ROS- no fever/chills/cough/congestion. Still with sores at site of prior likely tick bite.    Past Medical History-  Patient Active Problem List   Diagnosis Date Noted  . Diabetes mellitus type II, controlled ([place]) 05/11/2007    Priority: High  . History of adenomatous polyp of colon 02/19/2018    Priority: Medium  . BPH associated with nocturia 01/12/2018    Priority: Medium  . Retinal tear 03/17/2015    Priority: Medium  . Major depression in full remission ([place]) 10/06/2014    Priority: Medium  . Anemia 10/06/2014    Priority: Medium  . Diastolic dysfunction 99/35/7017    Priority: Medium  . Essential tremor 09/06/2007   Priority: Medium  . Hyperlipidemia 05/11/2007    Priority: Medium  . RUQ pain 01/07/2015    Priority: Low  . UTI (urinary tract infection) 11/11/2013    Priority: Low  . History of herpes zoster 07/17/2009    Priority: Low  . Osteoarthritis 04/16/2008    Priority: Low    Medications- reviewed and updated Current Outpatient Medications  Medication Sig Dispense Refill  . Ascorbic Acid (VITAMIN C) 500 MG CAPS Pt is taking every 2 days    . atorvastatin (LIPITOR) 10 MG tablet Take 1 tablet (10 mg total) by mouth daily. 30 tablet 5  . diazepam (VALIUM) 5 MG tablet TAKE 1 TABLET BY MOUTH EVERY 12 HOURS AS NEEDED 30 tablet 3  . DULoxetine (CYMBALTA) 60 MG capsule TAKE 1 CAPSULE BY MOUTH EVERY DAY 90 capsule 0  . metFORMIN (GLUCOPHAGE-XR) 500 MG 24 hr tablet TAKE 1 TABLET BY MOUTH EVERY DAY WITH BREAKFAST 90 tablet 1  . OVER THE COUNTER MEDICATION B-12 Vitamin 1000 mcg one tablet daily.     No current facility-administered medications for this visit.      Objective:  BP 130/79 (BP Location: Left Arm, Patient Position: Sitting, Cuff Size: Normal)   Pulse 74   Temp 98.5 F (36.9 C) (Oral)   Ht 5' 7.5" (1.715 m)   Wt 190 lb (86.2 kg)   BMI 29.32 kg/m  self reported vitals Gen: NAD, resting comfortably Lungs: nonlabored, normal respiratory rate  Skin: appears dry, no obvious rash- has 2 small black areas on right hip surrounded by slight erythema- much less  prominent than previous- also has a smaller area at top of right buttocks     Assessment and Plan   # skin rash/nonhealing tick bite S: Patient called a little over 2 weeks ago- he had redness along his right hip with 2 small open areas after tick bite- concern was for potential cellulitis.  We covered with doxycycline for 7 days.  No systemic symptoms so we doubted RMSF or Lyme disease but that it was reasonable to use doxycycline to be on the safe side.  He notes some small pinholes in the areas. Washing with soap and water  daily. THey itch well. He feels somewhat run down. He has noted some occasional pinpricks in his body- wondered about diabetes/neuropathy. Ist still able to walk without difficulty.  Since last visit- patient has become due for Td. No fever/chills. Mild headache after valium last night but no regular headache- took ibuprofen and that seems to help today. Right knee bothering him but no body aches.   Itching can radiate to groin. Does not have as much induration around the areas.   At night has been using [facility] itch relief cream- equivalent to benadryl.  A/P: It appears to me that the cellulitis is cleared- I do not see an obvious ongoing infection.  We discussed sometimes tick bites/wounds take time to heal-particularly in the distribution just under his belt line as there may be ongoing irritation/rubbing.  Still without obvious systemic symptoms-does have some mild paresthesias.  We discussed possibly using another round of doxycycline but with overall risk of an infection being low and still thinking risk of RMSF and Lyme is low-we opted for conservative care with Neosporin and covering areas with a Band-Aid after that to reduce shearing force.  If not better in another 2 to 3 weeks patient should let me now -Also now due for tetanus shot-he came by for car visit/tetanus injection  Future Appointments  Date Time Provider [place]  08/14/2019  8:00 AM Marin Olp, MD [facility]   Lab/Order associations: Skin rash  Need for Td vaccine - Plan: Td : Tetanus/diphtheria >7yo Preservative  free   Time Stamp The duration of face-to-face time during this visit was greater than 15 minutes. Greater than 50% of this time was spent in counseling, explanation of diagnosis, planning of further management, and/or coordination of care including discussing potential causes, discussing poor/delayed healing after insect bites, discussing conservative care, discussing possibly doing another antibiotic  course but favoring away from this.    Return precautions advised.  Garret Reddish, MD

## 2019-06-04 ENCOUNTER — Other Ambulatory Visit: Payer: Self-pay | Admitting: Family Medicine

## 2019-08-02 ENCOUNTER — Other Ambulatory Visit: Payer: Self-pay

## 2019-08-02 DIAGNOSIS — E119 Type 2 diabetes mellitus without complications: Secondary | ICD-10-CM

## 2019-08-05 ENCOUNTER — Encounter (INDEPENDENT_AMBULATORY_CARE_PROVIDER_SITE_OTHER): Payer: Self-pay

## 2019-08-05 ENCOUNTER — Telehealth: Payer: 59 | Admitting: Physician Assistant

## 2019-08-05 ENCOUNTER — Encounter: Payer: Self-pay | Admitting: Physician Assistant

## 2019-08-05 ENCOUNTER — Other Ambulatory Visit: Payer: Self-pay

## 2019-08-05 ENCOUNTER — Encounter: Payer: Self-pay | Admitting: Family Medicine

## 2019-08-05 DIAGNOSIS — G4489 Other headache syndrome: Secondary | ICD-10-CM

## 2019-08-05 DIAGNOSIS — J029 Acute pharyngitis, unspecified: Secondary | ICD-10-CM

## 2019-08-05 DIAGNOSIS — K624 Stenosis of anus and rectum: Secondary | ICD-10-CM

## 2019-08-05 DIAGNOSIS — Z20822 Contact with and (suspected) exposure to covid-19: Secondary | ICD-10-CM

## 2019-08-05 DIAGNOSIS — R059 Cough, unspecified: Secondary | ICD-10-CM

## 2019-08-05 DIAGNOSIS — R6889 Other general symptoms and signs: Secondary | ICD-10-CM | POA: Diagnosis not present

## 2019-08-05 DIAGNOSIS — R05 Cough: Secondary | ICD-10-CM

## 2019-08-05 MED ORDER — BENZONATATE 100 MG PO CAPS: 100.0000 mg | ORAL_CAPSULE | Freq: Three times a day (TID) | ORAL | 0 refills | Status: DC | PRN

## 2019-08-05 NOTE — Progress Notes (Signed)
E-Visit for Corona Virus Screening   Your current symptoms could be consistent with the coronavirus.  Many health care providers can now test patients at their office but not all are.  [facility] has multiple testing sites. For information on our COVID testing locations and hours go to [email or web address]  Please quarantine yourself while awaiting your test results.  We are enrolling you in our [place] for [place] . Daily you will receive a questionnaire within the [facility] website. Our COVID 19 response team willl be monitoriing your responses daily.  COVID 19 Testing Locations (Monday - Friday, 8 a.m. - 3:30 p.m.) [place]: [facility], [address], [place], [place]: [place], [place], [place], [place] (entrance off [address]) [place]: [place]. [address], [place], [place] (across from [facility])   COVID-19 is a respiratory illness with symptoms that are similar to the flu. Symptoms are typically mild to moderate, but there have been cases of severe illness and death due to the virus. The following symptoms may appear 2-14 days after exposure: . Fever . Cough . Shortness of breath or difficulty breathing . Chills . Repeated shaking with chills . Muscle pain . Headache . Sore throat . New loss of taste or smell . Fatigue . Congestion or runny nose . Nausea or vomiting . Diarrhea  It is vitally important that if you feel that you have an infection such as this virus or any other virus that you stay home and away from places where you may spread it to others.  You should self-quarantine for 14 days if you have symptoms that could potentially be coronavirus or have been in close contact a with a person diagnosed with COVID-19 within the last 2 weeks. You should avoid contact with people age 49 and older.   You should wear a mask or  cloth face covering over your nose and mouth if you must be around other people or animals, including pets (even at home). Try to stay at least 6 feet away from other people. This will protect the people around you.  You can use medication such as A prescription cough medication called Tessalon Perles 100 mg. You may take 1-2 capsules every 8 hours as needed for cough.  You may also take acetaminophen (Tylenol) as needed for fever. I have provided a work note    Reduce your risk of any infection by using the same precautions used for avoiding the common cold or flu:  Marland Kitchen Wash your hands often with soap and warm water for at least 20 seconds.  If soap and water are not readily available, use an alcohol-based hand sanitizer with at least 60% alcohol.  . If coughing or sneezing, cover your mouth and nose by coughing or sneezing into the elbow areas of your shirt or coat, into a tissue or into your sleeve (not your hands). . Avoid shaking hands with others and consider head nods or verbal greetings only. . Avoid touching your eyes, nose, or mouth with unwashed hands.  . Avoid close contact with people who are sick. . Avoid places or events with large numbers of people in one location, like concerts or sporting events. . Carefully consider travel plans you have or are making. . If you are planning any travel outside or inside the [place], visit the CDC's Travelers' Health webpage for the latest health notices. . If you have some symptoms but not  all symptoms, continue to monitor at home and seek medical attention if your symptoms worsen. . If you are having a medical emergency, call 911.  HOME CARE . Only take medications as instructed by your medical team. . Drink plenty of fluids and get plenty of rest. . A steam or ultrasonic humidifier can help if you have congestion.   GET HELP RIGHT AWAY IF YOU HAVE EMERGENCY WARNING SIGNS** FOR COVID-19. If you or someone is showing any of these signs seek  emergency medical care immediately. Call 911 or proceed to your closest emergency facility if: . You develop worsening high fever. . Trouble breathing . Bluish lips or face . Persistent pain or pressure in the chest . New confusion . Inability to wake or stay awake . You cough up blood. . Your symptoms become more severe  **This list is not all possible symptoms. Contact your medical provider for any symptoms that are sever or concerning to you.   MAKE SURE YOU   Understand these instructions.  Will watch your condition.  Will get help right away if you are not doing well or get worse.  Your e-visit answers were reviewed by a board certified advanced clinical practitioner to complete your personal care plan.  Depending on the condition, your plan could have included both over the counter or prescription medications.  If there is a problem please reply once you have received a response from your provider.  Your safety is important to [place].  If you have drug allergies check your prescription carefully.    You can use MyChart to ask questions about today's visit, request a non-urgent call back, or ask for a work or school excuse for 24 hours related to this e-Visit. If it has been greater than 24 hours you will need to follow up with your provider, or enter a new e-Visit to address those concerns. You will get an e-mail in the next two days asking about your experience.  I hope that your e-visit has been valuable and will speed your recovery. Thank you for using e-visits.   I spent 5-10 minutes on review and completion of this note- Lacy Duverney [facility]

## 2019-08-05 NOTE — Addendum Note (Signed)
Addended by: Waldon Merl on: 08/05/2019 12:16 PM   Modules accepted: Orders

## 2019-08-06 ENCOUNTER — Encounter (INDEPENDENT_AMBULATORY_CARE_PROVIDER_SITE_OTHER): Payer: Self-pay

## 2019-08-06 LAB — NOVEL CORONAVIRUS, NAA: SARS-CoV-2, NAA: NOT DETECTED

## 2019-08-07 ENCOUNTER — Encounter (INDEPENDENT_AMBULATORY_CARE_PROVIDER_SITE_OTHER): Payer: Self-pay

## 2019-08-07 NOTE — Telephone Encounter (Signed)
Had Evisit 08/05/19.

## 2019-08-08 ENCOUNTER — Encounter (INDEPENDENT_AMBULATORY_CARE_PROVIDER_SITE_OTHER): Payer: Self-pay

## 2019-08-09 ENCOUNTER — Encounter (INDEPENDENT_AMBULATORY_CARE_PROVIDER_SITE_OTHER): Payer: Self-pay

## 2019-08-10 ENCOUNTER — Encounter (INDEPENDENT_AMBULATORY_CARE_PROVIDER_SITE_OTHER): Payer: Self-pay

## 2019-08-11 ENCOUNTER — Encounter (INDEPENDENT_AMBULATORY_CARE_PROVIDER_SITE_OTHER): Payer: Self-pay

## 2019-08-12 ENCOUNTER — Encounter (INDEPENDENT_AMBULATORY_CARE_PROVIDER_SITE_OTHER): Payer: Self-pay

## 2019-08-13 ENCOUNTER — Encounter (INDEPENDENT_AMBULATORY_CARE_PROVIDER_SITE_OTHER): Payer: Self-pay

## 2019-08-14 ENCOUNTER — Ambulatory Visit: Payer: 59 | Admitting: Family Medicine

## 2019-08-14 ENCOUNTER — Encounter (INDEPENDENT_AMBULATORY_CARE_PROVIDER_SITE_OTHER): Payer: Self-pay

## 2019-08-14 ENCOUNTER — Encounter: Payer: Self-pay | Admitting: Family Medicine

## 2019-08-14 ENCOUNTER — Other Ambulatory Visit: Payer: Self-pay

## 2019-08-14 VITALS — BP 120/82 | HR 78 | Temp 98.7°F | Ht 67.5 in | Wt 191.0 lb

## 2019-08-14 DIAGNOSIS — F325 Major depressive disorder, single episode, in full remission: Secondary | ICD-10-CM

## 2019-08-14 DIAGNOSIS — E663 Overweight: Secondary | ICD-10-CM | POA: Diagnosis not present

## 2019-08-14 DIAGNOSIS — E119 Type 2 diabetes mellitus without complications: Secondary | ICD-10-CM | POA: Diagnosis not present

## 2019-08-14 DIAGNOSIS — Z23 Encounter for immunization: Secondary | ICD-10-CM | POA: Diagnosis not present

## 2019-08-14 DIAGNOSIS — E785 Hyperlipidemia, unspecified: Secondary | ICD-10-CM

## 2019-08-14 DIAGNOSIS — E1169 Type 2 diabetes mellitus with other specified complication: Secondary | ICD-10-CM

## 2019-08-14 LAB — POCT GLYCOSYLATED HEMOGLOBIN (HGB A1C): Hemoglobin A1C: 5.8 % — AB (ref 4.0–5.6)

## 2019-08-14 MED ORDER — METFORMIN HCL ER 500 MG PO TB24: ORAL_TABLET | ORAL | 1 refills | Status: DC

## 2019-08-14 MED ORDER — ATORVASTATIN CALCIUM 10 MG PO TABS: 10.0000 mg | ORAL_TABLET | Freq: Every day | ORAL | 1 refills | Status: DC

## 2019-08-14 MED ORDER — DIAZEPAM 5 MG PO TABS: 5.0000 mg | ORAL_TABLET | Freq: Two times a day (BID) | ORAL | 3 refills | Status: DC | PRN

## 2019-08-14 MED ORDER — DULOXETINE HCL 60 MG PO CPEP: 60.0000 mg | ORAL_CAPSULE | Freq: Every day | ORAL | 1 refills | Status: DC

## 2019-08-14 NOTE — Patient Instructions (Addendum)
Health Maintenance Due  Topic Date Due  . INFLUENZA VACCINE - today 07/27/2019   Lab Results  Component Value Date   HGBA1C 5.8 (A) 08/14/2019  continue current medicines   Make sure to snack if you are out working a long time to help avoid low blood sugars

## 2019-08-14 NOTE — Progress Notes (Signed)
Phone [phone]   Subjective:  Isaiah Dunn is a 63 y.o. year old very pleasant male patient who presents for/with See problem oriented charting Chief Complaint  Patient presents with  . Follow-up    Not fasting today.   . Diabetes  . Hyperlipidemia  . Depression   ROS- No chest pain or shortness of breath. No headache or blurry vision. Cough when thought he may have covid (negative test)- has resolved   Past Medical History-  Patient Active Problem List   Diagnosis Date Noted  . Diabetes mellitus type II, controlled ([place]) 05/11/2007    Priority: High  . History of adenomatous polyp of colon 02/19/2018    Priority: Medium  . BPH associated with nocturia 01/12/2018    Priority: Medium  . Retinal tear 03/17/2015    Priority: Medium  . Major depression in full remission ([place]) 10/06/2014    Priority: Medium  . Anemia 10/06/2014    Priority: Medium  . Diastolic dysfunction 56/21/3086    Priority: Medium  . Essential tremor 09/06/2007    Priority: Medium  . Hyperlipidemia associated with type 2 diabetes mellitus ([place]) 05/11/2007    Priority: Medium  . RUQ pain 01/07/2015    Priority: Low  . UTI (urinary tract infection) 11/11/2013    Priority: Low  . History of herpes zoster 07/17/2009    Priority: Low  . Osteoarthritis 04/16/2008    Priority: Low    Medications- reviewed and updated Current Outpatient Medications  Medication Sig Dispense Refill  . Ascorbic Acid (VITAMIN C) 500 MG CAPS Pt is taking every 2 days    . atorvastatin (LIPITOR) 10 MG tablet Take 1 tablet (10 mg total) by mouth daily. 30 tablet 5  . diazepam (VALIUM) 5 MG tablet TAKE 1 TABLET BY MOUTH EVERY 12 HOURS AS NEEDED 30 tablet 3  . DULoxetine (CYMBALTA) 60 MG capsule TAKE 1 CAPSULE BY MOUTH EVERY DAY 90 capsule 0  . metFORMIN (GLUCOPHAGE-XR) 500 MG 24 hr tablet TAKE 1 TABLET BY MOUTH EVERY DAY WITH BREAKFAST 90 tablet 1  . OVER THE COUNTER MEDICATION B-12 Vitamin 1000 mcg one tablet daily.      No current facility-administered medications for this visit.      Objective:  BP 120/82 (BP Location: Left Arm, Patient Position: Sitting, Cuff Size: Normal)   Pulse 78   Temp 98.7 F (37.1 C) (Oral)   Ht 5' 7.5" (1.715 m)   Wt 191 lb (86.6 kg)   SpO2 97%   BMI 29.47 kg/m  Gen: NAD, resting comfortably CV: RRR no murmurs rubs or gallops Lungs: CTAB no crackles, wheeze, rhonchi Abdomen: soft/nontender/nondistended/normal bowel sounds.  Ext: no edema Skin: warm, dry     Assessment and Plan   # Diabetes S: well controlled on Metformin XR 500 mg daily.  CBGs- Not checking BG at home. Reports rare occurrence of hypoglycemic episodes, mostly after exercising all day- encouraged snacking with this Exercise and diet- Carbs and sugar in moderation. Eats mostly lean protein. Walks dog twice daily on walking trail. Rides stationary bike every other day. Works out in the yard twice weekly.  Lab Results  Component Value Date   HGBA1C- poc machine 5.8 (A) 08/14/2019   HGBA1C 6.4 02/11/2019   HGBA1C 5.8 (A) 10/23/2018   A/P: Stable. Continue current medications.   #hyperlipidemia/overweight S: well controlled on Atorvastatin 10 mg daily.  Lab Results  Component Value Date   CHOL 143 02/11/2019   HDL 43.40 02/11/2019   LDLCALC 76  02/11/2019   LDLDIRECT 74 03/07/2014   TRIG 118.0 02/11/2019   CHOLHDL 3 02/11/2019   A/P: Stable. Continue current medications.  Weight stable even with covid 19- Encouraged need for healthy eating, regular exercise, weight loss.   # Depression S:Taking Duloxetine 60 mg daily and Diazepam 5 mg prn. Currently taking Diazepam every night at bedtime, takes for tremors and sleep- very helpful to him.   Lost wife Butch Penny March 14th 2020- he has been doing grief counseling. Has even done a group zoom class.  A/P: good control of depression with phq9 under 5. GAD7 under 5 as well- asked due to diazepam use.   Recommended follow up: 6 month physical   Lab/Order associations:   ICD-10-CM   1. Controlled type 2 diabetes mellitus without complication, without long-term current use of insulin (HCC)  E11.9 POCT glycosylated hemoglobin (Hb A1C)  2. Major depressive disorder with single episode, in full remission ([place])  F32.5   3. Hyperlipidemia associated with type 2 diabetes mellitus ([place])  E11.69    E78.5   4. Overweight  E66.3   5. Need for influenza vaccination  Z23 Flu Vaccine QUAD 6+ mos PF IM (Fluarix Quad PF)   Return precautions advised.  Garret Reddish, MD

## 2019-08-15 ENCOUNTER — Encounter (INDEPENDENT_AMBULATORY_CARE_PROVIDER_SITE_OTHER): Payer: Self-pay

## 2019-08-16 ENCOUNTER — Encounter (INDEPENDENT_AMBULATORY_CARE_PROVIDER_SITE_OTHER): Payer: Self-pay

## 2019-08-17 ENCOUNTER — Encounter (INDEPENDENT_AMBULATORY_CARE_PROVIDER_SITE_OTHER): Payer: Self-pay

## 2019-08-18 ENCOUNTER — Encounter (INDEPENDENT_AMBULATORY_CARE_PROVIDER_SITE_OTHER): Payer: Self-pay

## 2019-12-22 ENCOUNTER — Other Ambulatory Visit: Payer: Self-pay | Admitting: Family Medicine

## 2019-12-23 ENCOUNTER — Encounter: Payer: Self-pay | Admitting: Family Medicine

## 2019-12-23 NOTE — Telephone Encounter (Signed)
LAST APPOINTMENT DATE: @8 /19/2020  NEXT APPOINTMENT DATE:@3 /12/2019   LAST REFILL:08/14/2019 #30 with 3 rf

## 2020-02-02 ENCOUNTER — Other Ambulatory Visit: Payer: Self-pay | Admitting: Family Medicine

## 2020-02-16 LAB — HM DIABETES EYE EXAM

## 2020-02-24 ENCOUNTER — Encounter: Payer: Self-pay | Admitting: Family Medicine

## 2020-02-24 ENCOUNTER — Ambulatory Visit (INDEPENDENT_AMBULATORY_CARE_PROVIDER_SITE_OTHER): Payer: 59 | Admitting: Family Medicine

## 2020-02-24 ENCOUNTER — Other Ambulatory Visit: Payer: Self-pay

## 2020-02-24 VITALS — BP 124/80 | HR 78 | Temp 97.6°F | Ht 67.5 in | Wt 180.0 lb

## 2020-02-24 DIAGNOSIS — Z87891 Personal history of nicotine dependence: Secondary | ICD-10-CM

## 2020-02-24 DIAGNOSIS — Z23 Encounter for immunization: Secondary | ICD-10-CM | POA: Diagnosis not present

## 2020-02-24 DIAGNOSIS — E785 Hyperlipidemia, unspecified: Secondary | ICD-10-CM

## 2020-02-24 DIAGNOSIS — Z125 Encounter for screening for malignant neoplasm of prostate: Secondary | ICD-10-CM

## 2020-02-24 DIAGNOSIS — G250 Essential tremor: Secondary | ICD-10-CM | POA: Diagnosis not present

## 2020-02-24 DIAGNOSIS — D649 Anemia, unspecified: Secondary | ICD-10-CM

## 2020-02-24 DIAGNOSIS — F325 Major depressive disorder, single episode, in full remission: Secondary | ICD-10-CM

## 2020-02-24 DIAGNOSIS — Z0000 Encounter for general adult medical examination without abnormal findings: Secondary | ICD-10-CM | POA: Diagnosis not present

## 2020-02-24 DIAGNOSIS — E119 Type 2 diabetes mellitus without complications: Secondary | ICD-10-CM | POA: Diagnosis not present

## 2020-02-24 DIAGNOSIS — E1169 Type 2 diabetes mellitus with other specified complication: Secondary | ICD-10-CM

## 2020-02-24 LAB — CBC WITH DIFFERENTIAL/PLATELET
Basophils Absolute: 0 10*3/uL (ref 0.0–0.1)
Basophils Relative: 0.5 % (ref 0.0–3.0)
Eosinophils Absolute: 0.2 10*3/uL (ref 0.0–0.7)
Eosinophils Relative: 2.2 % (ref 0.0–5.0)
HCT: 40.9 % (ref 39.0–52.0)
Hemoglobin: 13.4 g/dL (ref 13.0–17.0)
Lymphocytes Relative: 36.4 % (ref 12.0–46.0)
Lymphs Abs: 2.5 10*3/uL (ref 0.7–4.0)
MCHC: 32.7 g/dL (ref 30.0–36.0)
MCV: 87 fl (ref 78.0–100.0)
Monocytes Absolute: 0.7 10*3/uL (ref 0.1–1.0)
Monocytes Relative: 9.7 % (ref 3.0–12.0)
Neutro Abs: 3.5 10*3/uL (ref 1.4–7.7)
Neutrophils Relative %: 51.2 % (ref 43.0–77.0)
Platelets: 259 10*3/uL (ref 150.0–400.0)
RBC: 4.7 Mil/uL (ref 4.22–5.81)
RDW: 14 % (ref 11.5–15.5)
WBC: 6.8 10*3/uL (ref 4.0–10.5)

## 2020-02-24 LAB — COMPREHENSIVE METABOLIC PANEL WITH GFR
ALT: 25 U/L (ref 0–53)
AST: 28 U/L (ref 0–37)
Albumin: 4.1 g/dL (ref 3.5–5.2)
Alkaline Phosphatase: 99 U/L (ref 39–117)
BUN: 18 mg/dL (ref 6–23)
CO2: 28 meq/L (ref 19–32)
Calcium: 9.6 mg/dL (ref 8.4–10.5)
Chloride: 104 meq/L (ref 96–112)
Creatinine, Ser: 0.99 mg/dL (ref 0.40–1.50)
GFR: 76.09 mL/min
Glucose, Bld: 90 mg/dL (ref 70–99)
Potassium: 4.7 meq/L (ref 3.5–5.1)
Sodium: 140 meq/L (ref 135–145)
Total Bilirubin: 0.9 mg/dL (ref 0.2–1.2)
Total Protein: 6.5 g/dL (ref 6.0–8.3)

## 2020-02-24 LAB — POC URINALSYSI DIPSTICK (AUTOMATED)
Bilirubin, UA: NEGATIVE
Blood, UA: NEGATIVE
Glucose, UA: NEGATIVE
Ketones, UA: POSITIVE
Leukocytes, UA: NEGATIVE
Nitrite, UA: NEGATIVE
Protein, UA: NEGATIVE
Spec Grav, UA: >=1.03 — AB (ref 1.010–1.025)
Urobilinogen, UA: 0.2 E.U./dL
pH, UA: 5 (ref 5.0–8.0)

## 2020-02-24 LAB — HEMOGLOBIN A1C: Hgb A1c MFr Bld: 6 % (ref 4.6–6.5)

## 2020-02-24 LAB — LIPID PANEL
Cholesterol: 125 mg/dL (ref 0–200)
HDL: 40.5 mg/dL
LDL Cholesterol: 67 mg/dL (ref 0–99)
NonHDL: 84.41
Total CHOL/HDL Ratio: 3
Triglycerides: 86 mg/dL (ref 0.0–149.0)
VLDL: 17.2 mg/dL (ref 0.0–40.0)

## 2020-02-24 LAB — MICROALBUMIN / CREATININE URINE RATIO
Creatinine,U: 223.7 mg/dL
Microalb Creat Ratio: 0.7 mg/g (ref 0.0–30.0)
Microalb, Ur: 1.6 mg/dL (ref 0.0–1.9)

## 2020-02-24 LAB — PSA: PSA: 0.57 ng/mL (ref 0.10–4.00)

## 2020-02-24 NOTE — Assessment & Plan Note (Signed)
S: Compliant with metformin 500 mg extended release once a day in the past-he has lost some weight and was experiencing muscle weakness and fatigue on Metformin so we held the dose.  On benazepril for renal protection in the past but orthostatic so stopped.. A/P: check a1c today- continue without metformin as long as a1c under 7 which I Suspect it will be- he prefers to be under 6 if possible.

## 2020-02-24 NOTE — Progress Notes (Signed)
Phone: [phone]   Subjective:  Patient presents today for their annual physical. Chief complaint-noted.   See problem oriented charting- ROS- full  review of systems was completed and negative  except for: light headedness and tremors  The following were reviewed and entered/updated in epic: Past Medical History:  Diagnosis Date  . Anemia   . Anxiety   . Cataract    Right eye  . Chronic kidney disease   . Depression   . Diastolic dysfunction, left ventricle    GRADE 2  . Diverticulosis   . Essential tremor    BENIGN--   . GERD (gastroesophageal reflux disease)   . History of kidney stones   . History of sepsis    SIRS post ureteroscopic stone extraction w/ stent placement 10-29-2013--  resolved  . Hyperlipidemia   . Hypertension   . Neuromuscular disorder ([place])   . OA (osteoarthritis)   . Retina disorder, left    LEFT EYE RETINAL BUBBLE (NO TEAR OR DETACHMENT)  PT IS SEEING DR Baird [facility] 03-11-2015 TO HAVE LASERED--  PT SEE FLOATERS  . Right knee meniscal tear   . Type 2 diabetes mellitus [facility])    Patient Active Problem List   Diagnosis Date Noted  . Diabetes mellitus type II, controlled ([place]) 05/11/2007    Priority: High  . History of adenomatous polyp of colon 02/19/2018    Priority: Medium  . BPH associated with nocturia 01/12/2018    Priority: Medium  . Retinal tear 03/17/2015    Priority: Medium  . Major depression in full remission ([place] ) 10/06/2014    Priority: Medium  . Anemia 10/06/2014    Priority: Medium  . Diastolic dysfunction [id number]    Priority: Medium  . Essential tremor 09/06/2007    Priority: Medium  . Hyperlipidemia associated with type 2 diabetes mellitus ([place]) 05/11/2007    Priority: Medium  . RUQ pain 01/07/2015    Priority: Low  . UTI (urinary tract infection) 11/11/2013    Priority: Low  . History of herpes zoster 07/17/2009    Priority: Low  . Osteoarthritis 04/16/2008    Priority: Low   Past Surgical History:  Procedure  Laterality Date  . ANTERIOR CERVICAL DECOMP/DISCECTOMY FUSION  04-24-2008   C5  -- C6  . CARDIAC CATHETERIZATION  06-21-2010  DR Loralie Champagne   RCA 25%/ MINIMAL DISEASE LM, CX  . CARDIOVASCULAR STRESS TEST  06-10-2010   NORMAL NUCLEAR STUDY BUT  CLINICALL ABNORMAL (CHEST PAIN, ST ABNORMALITY, HYPOTENSION)/   EF 61%  . CATARACT EXTRACTION, BILATERAL     July 2020  . CATARACT EXTRACTION, BILATERAL     Dr. Lucita Ferrara  . CYSTOSCOPY WITH RETROGRADE PYELOGRAM, URETEROSCOPY AND STENT PLACEMENT Left 10/30/2013   Procedure: CYSTOSCOPY WITH RETROGRADE PYELOGRAM, URETEROSCOPY AND STENT PLACEMENT;  Surgeon: Molli Hazard, MD;  Location[facility];  Service: Urology;  Laterality: Left;  . HOLMIUM LASER APPLICATION Left [id number]   Procedure: HOLMIUM LASER APPLICATION;  Surgeon: Molli Hazard, MD;  Location: [facility];  Service: Urology;  Laterality: Left;  . KNEE ARTHROSCOPY W/ DEBRIDEMENT Right X3  LAST ONE 12-31-2008  . KNEE ARTHROSCOPY WITH MEDIAL MENISECTOMY Right 03/13/2015   Procedure: RIGHT KNEE ARTHROSCOPY WITH PARTIAL MEDIAL MENISECTOMY AND CHONDROPLASTY ;  Surgeon: Susa Day, MD;  Location: [place];  Service: Orthopedics;  Laterality: Right;  . RETINAL DETACHMENT SURGERY Left 03-11-2015  . SHOULDER ARTHROSCOPY Right X2  LAST ONE 04-25-2006  . SHOULDER ARTHROSCOPY WITH ROTATOR CUFF REPAIR  AND SUBACROMIAL DECOMPRESSION Left 05-20-2010   AND DEBRIDEMENT BICEP TENDON/ OPEN DISTAL CLAVICAL RESECTON  . TONSILLECTOMY  AGE 35  . TRANSTHORACIC ECHOCARDIOGRAM  07-21-2010   GRADE II DIASTOLIC DYSFUNCTION/  EF 55%/  MILD DILATED AORTIC ROOT  . VASECTOMY REVERSAL  1993    Family History  Problem Relation Age of Onset  . Arthritis Mother   . Hypertension Mother   . Hyperlipidemia Mother   . Hypertension Father   . Hyperlipidemia Father   . Lung cancer Father   . Liver cancer Maternal Aunt   . Colon cancer Neg Hx   .  Esophageal cancer Neg Hx   . Pancreatic cancer Neg Hx   . Rectal cancer Neg Hx   . Stomach cancer Neg Hx     Medications- reviewed and updated Current Outpatient Medications  Medication Sig Dispense Refill  . Ascorbic Acid (VITAMIN C) 500 MG CAPS Pt is taking every 2 days    . atorvastatin (LIPITOR) 10 MG tablet Take 1 tablet (10 mg total) by mouth daily. 90 tablet 1  . diazepam (VALIUM) 5 MG tablet TAKE 1 TABLET (5 MG TOTAL) BY MOUTH EVERY 12 (TWELVE) HOURS AS NEEDED. 30 tablet 3  . DULoxetine (CYMBALTA) 60 MG capsule TAKE 1 CAPSULE BY MOUTH EVERY DAY 90 capsule 1  . OVER THE COUNTER MEDICATION B-12 Vitamin 1000 mcg one tablet daily.     No current facility-administered medications for this visit.    Allergies-reviewed and updated Allergies  Allergen Reactions  . Celebrex [Celecoxib] Other (See Comments)    GI UPSET  . Clindamycin Hcl Other (See Comments)    GI UPSET  . Erythromycin Other (See Comments)    GI UPSET    Social History   Social History Narrative   Wife has cancer (omentum- rare and tough to treat- will likely pass)   1 dog left      Regularly gives blood to [employer] as he thinks he may not be able to help wife, but he can help others. Has also given platelets.    Objective  Objective:  BP 124/80   Pulse 78   Temp 97.6 F (36.4 C)   Ht 5' 7.5" (1.715 m)   Wt 180 lb (81.6 kg)   SpO2 99%   BMI 27.78 kg/m  Gen: NAD, resting comfortably HEENT: Mask not removed due to covid 19. TM normal. Bridge of nose normal. Eyelids normal.  Neck: no thyromegaly or cervical lymphadenopathy  CV: RRR no murmurs rubs or gallops Lungs: CTAB no crackles, wheeze, rhonchi Abdomen: soft/nontender/nondistended/normal bowel sounds. No rebound or guarding.  Ext: no edema Skin: warm, dry Neuro: grossly normal, moves all extremities, PERRLA  Diabetic Foot Exam - Simple   Simple Foot Form Diabetic Foot exam was performed with the following findings: Yes 02/24/2020  8:29 AM    Visual Inspection No deformities, no ulcerations, no other skin breakdown bilaterally: Yes Sensation Testing Intact to touch and monofilament testing bilaterally: Yes Pulse Check Posterior Tibialis and Dorsalis pulse intact bilaterally: Yes Comments      Assessment and Plan  64 y.o. male presenting for annual physical.  Health Maintenance counseling: 1. Anticipatory guidance: Patient counseled regarding regular dental exams yes q6 months, eye exams yes yearly with history of retinal tear,  avoiding smoking and second hand smoke , limiting alcohol to 0 beverages per day.   2. Risk factor reduction:  Advised patient of need for regular exercise and diet rich and fruits and vegetables to reduce  risk of heart attack and stroke. Exercise- rides stationary bike 3 days a week and staying busy with projects at home that he had put to side while caregiving for wife. Diet- eats at home and tries to eat healthy diet- down 11 lbs from last visit. Has reduced red meat. Does boost high protein drinks. Eats out once a week- lock stock and bagel once a week.  Wt Readings from Last 3 Encounters:  02/24/20 180 lb (81.6 kg)  08/14/19 191 lb (86.6 kg)  05/02/19 190 lb (86.2 kg)  3. Immunizations/screenings/ancillary studies-interested in COVID-19 vaccination when available for his phase Immunization History  Administered Date(s) Administered  . Influenza Split 09/30/2011, 09/17/2012  . Influenza Whole 09/18/2008  . Influenza,inj,Quad PF,6+ Mos 10/06/2014, 01/01/2016, 10/14/2016, 09/11/2017, 10/23/2018, 08/14/2019  . Influenza-Unspecified 08/14/2019  . Pneumococcal Polysaccharide-23 04/17/2009, 01/07/2015  . Td 04/17/2009, 05/02/2019  . Zoster Recombinat (Shingrix) 02/11/2019   4. Prostate cancer screening- low risk prior PSA trend-check again today. Nocturia a few times a night down to once a night- actually better off metformin Lab Results  Component Value Date   PSA 0.53 02/11/2019   PSA 0.55  01/12/2018   PSA 0.52 12/28/2016   5. Colon cancer screening - February 13, 2018 with 5-year repeat planned due to history of tubular adenoma 6. Skin cancer screening-no dermatologist. advised regular sunscreen use. Denies worrisome, changing, or new skin lesions.  7. Former smoker-20 pack years but quit in 2002.  UA normal last year- check again.  We will get AAA scan at age 14   Status of chronic or acute concerns   #Wife died of omentum and breast cancer after 5-year battle March 09, 2019 - still meets with 2 people from his support group from hospice - he is going to be doing yoga with hospice   #Diabetes S: Compliant with metformin 500 mg extended release once a day in the past-he has lost some weight and was experiencing muscle weakness and fatigue on Metformin so we held the dose.  On benazepril for renal protection in the past but orthostatic so stopped.. A/P: check a1c today- continue without metformin as long as a1c under 7 which I Suspect it will be- he prefers to be under 6 if possible.     #Hyperlipidemia S: Compliant with atorvastatin 10 mg A/P: Hopefully controlled -update lipid panel today. Likely keep dose stable even if LDL above 70.  Lab Results  Component Value Date   CHOL 143 02/11/2019   HDL 43.40 02/11/2019   LDLCALC 76 02/11/2019   LDLDIRECT 74 03/07/2014   TRIG 118.0 02/11/2019   CHOLHDL 3 02/11/2019     #Essential tremor S: Valium has been only medication that has given him relief- takes at least once a day- worse with physical activity.  Multiple medication trials in the past.  Has seen neurology in the past. A/P: stable- continue current meds. I would be ok with refills for up to 6 months from today.   -actually notes evening dose is helpful for tremor and sleep   #Major depression in full remission S: Patient doing well on Cymbalta 60 mg.  PHQ 9 today of 1 Depression screen [facility] 2/9 02/24/2020 08/14/2019 02/11/2019  Decreased Interest 0 0 0  Down,  Depressed, Hopeless 0 0 0  PHQ - 2 Score 0 0 0  Altered sleeping 1 2 0  Tired, decreased energy 0 0 1  Change in appetite 0 0 0  Feeling bad or failure about yourself  0  0 0  Trouble concentrating 0 0 0  Moving slowly or fidgety/restless 0 0 0  Suicidal thoughts 0 0 0  PHQ-9 Score 1 2 1   Difficult doing work/chores Not difficult at all Not difficult at all Not difficult at all  A/P: reasonable control- continue current meds   #Intermittent anemia-gives double reds in past- has been doing triple platelets lately. Also takes b12. Iron levels have been ok   #occasional mild lightheadedness with active if goes from being active to bending over and stands up quickly- etter with fluidss  Recommended follow up: Return in about 6 months (around 08/26/2020) for follow up as long as a1c is looking good- or sooner if needed.  Lab/Order associations: not fasting   ICD-10-CM   1. Controlled type 2 diabetes mellitus without complication, without long-term current use of insulin (HCC)  E11.9 Urine Microalbumin w/creat. ratio    Hemoglobin A1c    CBC with Differential/Platelet    Comprehensive metabolic panel    Lipid panel    POCT Urinalysis Dipstick (Automated)  2. Major depressive disorder with single episode, in full remission ([place])  F32.5   3. Hyperlipidemia associated with type 2 diabetes mellitus ([place])  E11.69    E78.5   4. Essential tremor  G25.0   5. Anemia, unspecified type  D64.9   6. Former smoker  Z87.891 POCT Urinalysis Dipstick (Automated)  7. Screening for prostate cancer  Z12.5 PSA  8. Preventative health care  Z00.00 Urine Microalbumin w/creat. ratio    Hemoglobin A1c    CBC with Differential/Platelet    Comprehensive metabolic panel    Lipid panel    PSA    POCT Urinalysis Dipstick (Automated)    No orders of the defined types were placed in this encounter.   Return precautions advised.  Garret Reddish, MD

## 2020-02-24 NOTE — Addendum Note (Signed)
Addended by: Francis Dowse T on: 02/24/2020 09:26 AM   Modules accepted: Orders

## 2020-02-24 NOTE — Addendum Note (Signed)
Addended by: Clyde Lundborg A on: 02/24/2020 09:18 AM   Modules accepted: Orders

## 2020-02-24 NOTE — Patient Instructions (Addendum)
Health Maintenance Due  Topic Date Due  . URINE MICROALBUMIN -today 10/24/2019  . FOOT EXAM - today 02/12/2020  . HEMOGLOBIN A1C-today  02/14/2020   Kanesha Cadle- Final shingrix today  Please stop by lab before you go If you do not have mychart- we will call you about results within 5 business days of [place] receiving them.  If you have mychart- we will send your results within 3 business days of [place] receiving them.  If abnormal or we want to clarify a result, we will call or mychart you to make sure you receive the message.  If you have questions or concerns or don't hear within 5 business days, please send [place] a message or call us.

## 2020-02-25 ENCOUNTER — Encounter: Payer: Self-pay | Admitting: Family Medicine

## 2020-03-15 ENCOUNTER — Other Ambulatory Visit: Payer: Self-pay | Admitting: Family Medicine

## 2020-04-20 ENCOUNTER — Encounter: Payer: Self-pay | Admitting: Family Medicine

## 2020-04-21 ENCOUNTER — Other Ambulatory Visit: Payer: Self-pay | Admitting: Family Medicine

## 2020-04-23 NOTE — Telephone Encounter (Signed)
[place] Database Verified LR: 03-11-2020 Qty: 30 Last office visit: 02-24-2020 Upcoming appointment: 08-26-2020

## 2020-06-13 ENCOUNTER — Other Ambulatory Visit: Payer: Self-pay | Admitting: Family Medicine

## 2020-06-17 NOTE — Telephone Encounter (Signed)
Do you want to see patient to talk about changing meds?

## 2020-06-18 ENCOUNTER — Encounter: Payer: Self-pay | Admitting: Family Medicine

## 2020-06-18 NOTE — Telephone Encounter (Signed)
Im ok with lovastatin 40mg  daily and we can recheck in September- warn him that #s may go up

## 2020-06-19 ENCOUNTER — Other Ambulatory Visit: Payer: Self-pay

## 2020-06-19 MED ORDER — LOVASTATIN 40 MG PO TABS: 40.0000 mg | ORAL_TABLET | Freq: Every day | ORAL | 0 refills | Status: DC

## 2020-07-31 ENCOUNTER — Encounter: Payer: Self-pay | Admitting: Family Medicine

## 2020-08-25 NOTE — Patient Instructions (Addendum)
Health Maintenance Due  Topic Date Due  . INFLUENZA VACCINE -today 07/26/2020   Glad you are doing so well all things considered! Great job on healthy eating and exercise!   Please stop by lab before you go If you have mychart- we will send your results within 3 business days of [place] receiving them.  If you do not have mychart- we will call you about results within 5 business days of [place] receiving them.  *please note we are currently using [facility] which has a longer processing time than [place] typically so labs may not come back as quickly as in the past *please also note that you will see labs on mychart as soon as they post. I will later go in and write notes on them- will say "notes from Dr. Yong Channel"

## 2020-08-25 NOTE — Progress Notes (Signed)
Phone [phone] In person visit   Subjective:   Isaiah Dunn is a 64 y.o. year old very pleasant male patient who presents for/with See problem oriented charting Chief Complaint  Patient presents with  . Follow-up  . Diabetes   This visit occurred during the SARS-CoV-2 public health emergency.  Safety protocols were in place, including screening questions prior to the visit, additional usage of staff PPE, and extensive cleaning of exam room while observing appropriate contact time as indicated for disinfecting solutions.   Past Medical History-  Patient Active Problem List   Diagnosis Date Noted  . Diabetes mellitus type II, controlled ([place]) 05/11/2007    Priority: High  . History of adenomatous polyp of colon 02/19/2018    Priority: Medium  . BPH associated with nocturia 01/12/2018    Priority: Medium  . Retinal tear 03/17/2015    Priority: Medium  . Major depression in full remission ([place]) 10/06/2014    Priority: Medium  . Anemia 10/06/2014    Priority: Medium  . Diastolic dysfunction 67/89/3810    Priority: Medium  . Essential tremor 09/06/2007    Priority: Medium  . Hyperlipidemia associated with type 2 diabetes mellitus ([place]) 05/11/2007    Priority: Medium  . RUQ pain 01/07/2015    Priority: Low  . UTI (urinary tract infection) 11/11/2013    Priority: Low  . History of herpes zoster 07/17/2009    Priority: Low  . Osteoarthritis 04/16/2008    Priority: Low    Medications- reviewed and updated Current Outpatient Medications  Medication Sig Dispense Refill  . Ascorbic Acid (VITAMIN C) 500 MG CAPS Pt is taking every 2 days    . cholecalciferol (VITAMIN D3) 25 MCG (1000 UNIT) tablet     . diazepam (VALIUM) 5 MG tablet Take 1 tablet (5 mg total) by mouth every 12 (twelve) hours as needed (essential tremor (other treatments have not helped)). 30 tablet 5  . DULoxetine (CYMBALTA) 60 MG capsule TAKE 1 CAPSULE BY MOUTH EVERY DAY 90 capsule 1  . OVER THE COUNTER  MEDICATION B-12 Vitamin 1000 mcg one tablet daily.    Marland Kitchen atorvastatin (LIPITOR) 10 MG tablet Take 1 tablet (10 mg total) by mouth daily. 90 tablet 3   No current facility-administered medications for this visit.     Objective:  BP 120/80   Pulse (!) 59   Temp 98 F (36.7 C) (Temporal)   Ht 5\' 7"  (1.702 m)   Wt 173 lb 3.2 oz (78.6 kg)   SpO2 100%   BMI 27.13 kg/m  Gen: NAD, resting comfortably CV: RRR no murmurs rubs or gallops Lungs: CTAB no crackles, wheeze, rhonchi Ext: no edema Skin: warm, dry     Assessment and Plan   #social update- just started dating again (dad encouraged him). Wants to retire in 2 years and do more volunteering including cancer center  #Diabetes S: Diet controlled as of last visit.  Previously on metformin 500 mg extended release once a day.  On benazepril for renal protection in the past but orthostatic so stopped.. Exercise and diet- Down another 7 lbs. He has further improved diet- no fast foods from last visit. Mainly fish and fowl. Does nuts/trail mixes as dessert or midday snack (or if any hint of lows) Doing stationary bike right now as well as hand weights- may transtion to outdoor exercise.  Lab Results  Component Value Date   HGBA1C 6.0 02/24/2020   HGBA1C 5.8 (A) 08/14/2019   HGBA1C 6.4 02/11/2019  A/P: Suspect well-controlled-update A1c with labs today    #Hyperlipidemia S: off all meds. In past Compliant with atorvastatin 10 mg daily but chnaged to lovastatin due to free cost. From my chart message "sounds good- you can hold off on all statins due to side effects and we can get a game plan together at next visit"  Myalgias were taking exercise difficult Lab Results  Component Value Date   CHOL 125 02/24/2020   HDL 40.50 02/24/2020   LDLCALC 67 02/24/2020   LDLDIRECT 74 03/07/2014   TRIG 86.0 02/24/2020   CHOLHDL 3 02/24/2020  A/P: likely poor control off lovastatin (myalgias) but did well on atorvastatin 10mg  and didn't think  cost was too bad - will resume atorvastatinand chck cholesterol next visit   #Essential tremor S: Valium has been only medication that has given him relief.  Multiple medication trials in the past.  Has seen neurology in the past. Has been using for sleep some and helps with half tablet as needed in the day A/P: doing reasonably well- continue current rx    #Major depression in full remission S: Patient doing well on Cymbalta 60 mg.  PHQ 9 today of 3- reasonable control. No SI.   Depression screen [facility] 2/9 08/26/2020 02/24/2020 08/14/2019  Decreased Interest 0 0 0  Down, Depressed, Hopeless 0 0 0  PHQ - 2 Score 0 0 0  Altered sleeping 3 1 2   Tired, decreased energy 0 0 0  Change in appetite 0 0 0  Feeling bad or failure about yourself  0 0 0  Trouble concentrating 0 0 0  Moving slowly or fidgety/restless 0 0 0  Suicidal thoughts - 0 0  PHQ-9 Score 3 1 2   Difficult doing work/chores Not difficult at all Not difficult at all Not difficult at all  A/P: Reasonable control/full remission-continue current medications  Recommended follow up: Return in about 6 months (around 02/23/2021) for physical or sooner if needed.  Lab/Order associations:   ICD-10-CM   1. Controlled type 2 diabetes mellitus without complication, without long-term current use of insulin (HCC)  Q22.9 COMPLETE METABOLIC PANEL WITH GFR    Hemoglobin A1c    CANCELED: CBC With Differential/Platelet  2. Hyperlipidemia associated with type 2 diabetes mellitus ([place])  E11.69    E78.5   3. Major depressive disorder with single episode, in full remission ([place])  F32.5   4. Essential tremor  G25.0     Meds ordered this encounter  Medications  . atorvastatin (LIPITOR) 10 MG tablet    Sig: Take 1 tablet (10 mg total) by mouth daily.    Dispense:  90 tablet    Refill:  3  . diazepam (VALIUM) 5 MG tablet    Sig: Take 1 tablet (5 mg total) by mouth every 12 (twelve) hours as needed (essential tremor (other treatments have not helped)).     Dispense:  30 tablet    Refill:  5    Not to exceed 2 additional fills before 06/21/2020   Return precautions advised.  Garret Reddish, MD

## 2020-08-26 ENCOUNTER — Encounter: Payer: Self-pay | Admitting: Family Medicine

## 2020-08-26 ENCOUNTER — Ambulatory Visit: Payer: 59 | Admitting: Family Medicine

## 2020-08-26 ENCOUNTER — Other Ambulatory Visit: Payer: Self-pay

## 2020-08-26 VITALS — BP 120/80 | HR 59 | Temp 98.0°F | Ht 67.0 in | Wt 173.2 lb

## 2020-08-26 DIAGNOSIS — E119 Type 2 diabetes mellitus without complications: Secondary | ICD-10-CM

## 2020-08-26 DIAGNOSIS — E1169 Type 2 diabetes mellitus with other specified complication: Secondary | ICD-10-CM | POA: Diagnosis not present

## 2020-08-26 DIAGNOSIS — F325 Major depressive disorder, single episode, in full remission: Secondary | ICD-10-CM

## 2020-08-26 DIAGNOSIS — Z23 Encounter for immunization: Secondary | ICD-10-CM

## 2020-08-26 DIAGNOSIS — E785 Hyperlipidemia, unspecified: Secondary | ICD-10-CM

## 2020-08-26 DIAGNOSIS — G250 Essential tremor: Secondary | ICD-10-CM | POA: Diagnosis not present

## 2020-08-26 MED ORDER — ATORVASTATIN CALCIUM 10 MG PO TABS
10.0000 mg | ORAL_TABLET | Freq: Every day | ORAL | 3 refills | Status: DC
Start: 2020-08-26 — End: 2021-03-03

## 2020-08-26 MED ORDER — DIAZEPAM 5 MG PO TABS
5.0000 mg | ORAL_TABLET | Freq: Two times a day (BID) | ORAL | 5 refills | Status: DC | PRN
Start: 2020-08-26 — End: 2021-03-03

## 2020-08-26 NOTE — Addendum Note (Signed)
Addended by: Liliane Channel on: 08/26/2020 11:47 AM   Modules accepted: Orders

## 2020-08-27 LAB — COMPLETE METABOLIC PANEL WITH GFR
AG Ratio: 1.9 (calc) (ref 1.0–2.5)
ALT: 16 U/L (ref 9–46)
AST: 19 U/L (ref 10–35)
Albumin: 4.1 g/dL (ref 3.6–5.1)
Alkaline phosphatase (APISO): 80 U/L (ref 35–144)
BUN: 21 mg/dL (ref 7–25)
CO2: 29 mmol/L (ref 20–32)
Calcium: 9.5 mg/dL (ref 8.6–10.3)
Chloride: 103 mmol/L (ref 98–110)
Creat: 1.03 mg/dL (ref 0.70–1.25)
GFR, Est African American: 89 mL/min/{1.73_m2} (ref >=60)
GFR, Est Non African American: 76 mL/min/{1.73_m2} (ref >=60)
Globulin: 2.2 g/dL (calc) (ref 1.9–3.7)
Glucose, Bld: 85 mg/dL (ref 65–99)
Potassium: 4.9 mmol/L (ref 3.5–5.3)
Sodium: 138 mmol/L (ref 135–146)
Total Bilirubin: 0.9 mg/dL (ref 0.2–1.2)
Total Protein: 6.3 g/dL (ref 6.1–8.1)

## 2020-08-27 LAB — HEMOGLOBIN A1C
Hgb A1c MFr Bld: 5.8 % of total Hgb — ABNORMAL HIGH (ref <5.7)
Mean Plasma Glucose: 120 (calc)
eAG (mmol/L): 6.6 (calc)

## 2020-09-03 ENCOUNTER — Other Ambulatory Visit: Payer: Self-pay | Admitting: Family Medicine

## 2020-10-26 ENCOUNTER — Ambulatory Visit: Payer: 59

## 2020-10-29 ENCOUNTER — Encounter: Payer: Self-pay | Admitting: Family Medicine

## 2021-02-19 ENCOUNTER — Other Ambulatory Visit: Payer: Self-pay | Admitting: Family Medicine

## 2021-03-02 NOTE — Progress Notes (Signed)
Phone: [phone]   Subjective:  Patient presents today for their annual physical. Chief complaint-noted.   See problem oriented charting- ROS- full  review of systems was completed and negative  except for:  tremor  The following were reviewed and entered/updated in epic: Past Medical History:  Diagnosis Date  . Anemia   . Anxiety   . Cataract    Right eye  . Chronic kidney disease   . Depression   . Diastolic dysfunction, left ventricle    GRADE 2  . Diverticulosis   . Essential tremor    BENIGN--   . GERD (gastroesophageal reflux disease)   . History of kidney stones   . History of sepsis    SIRS post ureteroscopic stone extraction w/ stent placement 10-29-2013--  resolved  . Hyperlipidemia   . Hypertension   . Neuromuscular disorder ([place])   . OA (osteoarthritis)   . Retina disorder, left    LEFT EYE RETINAL BUBBLE (NO TEAR OR DETACHMENT)  PT IS SEEING DR Baird [facility] 03-11-2015 TO HAVE LASERED--  PT SEE FLOATERS  . Right knee meniscal tear   . Type 2 diabetes mellitus [facility])    Patient Active Problem List   Diagnosis Date Noted  . Diabetes mellitus type II, controlled ([place]) 05/11/2007    Priority: High  . Aortic atherosclerosis ([place]) 03/03/2021    Priority: Medium  . Myalgia due to statin 03/03/2021    Priority: Medium  . History of adenomatous polyp of colon 02/19/2018    Priority: Medium  . BPH associated with nocturia 01/12/2018    Priority: Medium  . Retinal tear 03/17/2015    Priority: Medium  . Major depression in full remission ([place]) 10/06/2014    Priority: Medium  . Anemia 10/06/2014    Priority: Medium  . Diastolic dysfunction 59/16/3846    Priority: Medium  . Essential tremor 09/06/2007    Priority: Medium  . Hyperlipidemia associated with type 2 diabetes mellitus ([place]) 05/11/2007    Priority: Medium  . RUQ pain 01/07/2015    Priority: Low  . UTI (urinary tract infection) 11/11/2013    Priority: Low  . History of herpes zoster 07/17/2009     Priority: Low  . Osteoarthritis 04/16/2008    Priority: Low   Past Surgical History:  Procedure Laterality Date  . ANTERIOR CERVICAL DECOMP/DISCECTOMY FUSION  04-24-2008   C5  -- C6  . CARDIAC CATHETERIZATION  06-21-2010  DR Loralie Champagne   RCA 25%/ MINIMAL DISEASE LM, CX  . CARDIOVASCULAR STRESS TEST  06-10-2010   NORMAL NUCLEAR STUDY BUT  CLINICALL ABNORMAL (CHEST PAIN, ST ABNORMALITY, HYPOTENSION)/   EF 61%  . CATARACT EXTRACTION, BILATERAL     July 2020  . CATARACT EXTRACTION, BILATERAL     Dr. Lucita Ferrara  . CYSTOSCOPY WITH RETROGRADE PYELOGRAM, URETEROSCOPY AND STENT PLACEMENT Left 10/30/2013   Procedure: CYSTOSCOPY WITH RETROGRADE PYELOGRAM, URETEROSCOPY AND STENT PLACEMENT;  Surgeon: Molli Hazard, MD;  Location: [facility];  Service: Urology;  Laterality: Left;  . HOLMIUM LASER APPLICATION Left 65/08/9356   Procedure: HOLMIUM LASER APPLICATION;  Surgeon: Molli Hazard, MD;  Location: [facility];  Service: Urology;  Laterality: Left;  . KNEE ARTHROSCOPY W/ DEBRIDEMENT Right X3  LAST ONE 12-31-2008  . KNEE ARTHROSCOPY WITH MEDIAL MENISECTOMY Right 03/13/2015   Procedure: RIGHT KNEE ARTHROSCOPY WITH PARTIAL MEDIAL MENISECTOMY AND CHONDROPLASTY ;  Surgeon: Susa Day, MD;  Location: [facility];  Service: Orthopedics;  Laterality: Right;  . RETINAL DETACHMENT  SURGERY Left 03-11-2015  . SHOULDER ARTHROSCOPY Right X2  LAST ONE 04-25-2006  . SHOULDER ARTHROSCOPY WITH ROTATOR CUFF REPAIR AND SUBACROMIAL DECOMPRESSION Left 05-20-2010   AND DEBRIDEMENT BICEP TENDON/ OPEN DISTAL CLAVICAL RESECTON  . TONSILLECTOMY  AGE 58  . TRANSTHORACIC ECHOCARDIOGRAM  07-21-2010   GRADE II DIASTOLIC DYSFUNCTION/  EF 55%/  MILD DILATED AORTIC ROOT  . VASECTOMY REVERSAL  1993    Family History  Problem Relation Age of Onset  . Arthritis Mother   . Hypertension Mother   . Hyperlipidemia Mother   . Hypertension Father   . Hyperlipidemia  Father   . Lung cancer Father   . Liver cancer Maternal Aunt   . Colon cancer Neg Hx   . Esophageal cancer Neg Hx   . Pancreatic cancer Neg Hx   . Rectal cancer Neg Hx   . Stomach cancer Neg Hx     Medications- reviewed and updated Current Outpatient Medications  Medication Sig Dispense Refill  . b complex vitamins capsule Take 1 capsule by mouth daily.    . cholecalciferol (VITAMIN D3) 25 MCG (1000 UNIT) tablet     . diazepam (VALIUM) 5 MG tablet Take 1 tablet (5 mg total) by mouth every 12 (twelve) hours as needed (essential tremor (other treatments have not helped)). 30 tablet 5  . DULoxetine (CYMBALTA) 60 MG capsule TAKE 1 CAPSULE BY MOUTH EVERY DAY 90 capsule 1   No current facility-administered medications for this visit.    Allergies-reviewed and updated Allergies  Allergen Reactions  . Celebrex [Celecoxib] Other (See Comments)    GI UPSET  . Clindamycin Hcl Other (See Comments)    GI UPSET  . Erythromycin Other (See Comments)    GI UPSET    Social History   Social History Narrative   Wife has cancer (omentum- rare and tough to treat- will likely pass)   1 dog left      Regularly gives blood to [employer] as he thinks he may not be able to help wife, but he can help others. Has also given platelets.    Objective  Objective:  BP 120/86   Pulse 80   Temp 97.8 F (36.6 C) (Temporal)   Ht 5\' 7"  (1.702 m)   Wt 174 lb 9.6 oz (79.2 kg)   SpO2 99%   BMI 27.35 kg/m  Gen: NAD, resting comfortably HEENT: Mucous membranes are moist. Oropharynx normal Neck: no thyromegaly CV: RRR no murmurs rubs or gallops Lungs: CTAB no crackles, wheeze, rhonchi Abdomen: soft/nontender/nondistended/normal bowel sounds. No rebound or guarding.  Ext: no edema Skin: warm, dry Neuro: grossly normal, moves all extremities, PERRLA  Diabetic Foot Exam - Simple   Simple Foot Form Diabetic Foot exam was performed with the following findings: Yes 03/03/2021  9:29 AM  Visual  Inspection No deformities, no ulcerations, no other skin breakdown bilaterally: Yes Sensation Testing Intact to touch and monofilament testing bilaterally: Yes Pulse Check Posterior Tibialis and Dorsalis pulse intact bilaterally: Yes Comments       Assessment and Plan  65 y.o. male presenting for annual physical.  Health Maintenance counseling: 1. Anticipatory guidance: Patient counseled regarding regular dental exams -q6 months, eye exams -yearly will have them send [place] a copy from 03/17/21,  avoiding smoking and second hand smoke , limiting alcohol to 2 beverages per day-he does not drink regularly- perhaps 1 drink twice a week.   2. Risk factor reduction:  Advised patient of need for regular exercise and diet rich and  fruits and vegetables to reduce risk of heart attack and stroke. Exercise- walking or running daily and strength training in mornings. Diet-.  Last year patient was down 11 pounds to 180.  Eats out once a week at [employer] and bagel but otherwise mainly eating at home and eating healthy there- some more eating out since spending time  with GF Wt Readings from Last 3 Encounters:  03/03/21 174 lb 9.6 oz (79.2 kg)  08/26/20 173 lb 3.2 oz (78.6 kg)  02/24/20 180 lb (81.6 kg)  3. Immunizations/screenings/ancillary studies-we opted to hold off on Prevnar 13 for now-we will consider Prevnar 20 once we have this available or he can get this at his pharmacy  Immunization History  Administered Date(s) Administered  . Influenza Split 09/30/2011, 09/17/2012  . Influenza Whole 09/18/2008  . Influenza,inj,Quad PF,6+ Mos 10/06/2014, 01/01/2016, 10/14/2016, 09/11/2017, 10/23/2018, 08/14/2019, 08/26/2020  . Influenza-Unspecified 08/14/2019  . PFIZER(Purple Top)SARS-COV-2 Vaccination 03/12/2020, 04/02/2020, 10/06/2020  . Pneumococcal Polysaccharide-23 04/17/2009, 01/07/2015  . Td 04/17/2009, 05/02/2019  . Zoster Recombinat (Shingrix) 02/11/2019, 02/24/2020  4. Prostate cancer screening-  low risk prior PSA trend-continue to trend with labs today.  Nocturia down to once a night-better off Metformin interestingly enough Lab Results  Component Value Date   PSA 0.57 02/24/2020   PSA 0.53 02/11/2019   PSA 0.55 01/12/2018   5. Colon cancer screening - February 13, 2018 with 5-year repeat planned due to history of tubular adenoma 6. Skin cancer screening-no dermatologist.advised regular sunscreen use. Denies worrisome, changing, or new skin lesions.  7.  Former smoker-20 pack years but quit in 2002.  We discussed AAA scan-he already had this on 01/07/16 with cat abdomen/pelvis and "Atherosclerosis of the abdominal aorta is identified without aneurysmal dilatation." 8. STD screening - active with GF (hysterectomy). Declines STD screening. Both came from married relationships and monogamous  With prior partners and monogamous with each other  Status of chronic or acute concerns   #Social update-patient had started dating again after last visit with encouragement from his father.  Patient is still interested in retiring within 2 years and doing more volunteering at [employer] . Seeing someone on a regular basis- internal medicine PA in [place]- shes a runner- he has been doing some walking/running with no issues.   #Diabetes S: Diet controlled with weight loss in 2021.  Previously on  with metformin 500 mg extended release once a day.  On benazepril for renal protection in the past but orthostatic so stopped..  Lab Results  Component Value Date   HGBA1C 5.8 (H) 08/26/2020   HGBA1C 6.0 02/24/2020   HGBA1C 5.8 (A) 08/14/2019  A/P: Hopefully stable-update A1c with labs today   #Hyperlipidemia S: Compliant with atorvastatin 10 mg-lovastatin was more cost effective but caused myalgias-we will switch back to atorvastatin myalgias occurred again unfortunately.  Lab Results  Component Value Date   CHOL 125 02/24/2020   HDL 40.50 02/24/2020   LDLCALC 67 02/24/2020   LDLDIRECT 74  03/07/2014   TRIG 86.0 02/24/2020   CHOLHDL 3 02/24/2020   A/P: Well-controlled on last check-update lipid panel with labs today - off statin now may try once a week to see if he tolerates this   #Essential tremor S: Valium has been only medication that has given him relief.  Multiple medication trials in the past.  Has seen neurology in the past. A/P: Reasonable control-continue current medication. Some increase with age   #Major depression in full remission S: Patient doing well on Cymbalta  60 mg.  PHQ 9 today of0  Depression screen [facility] 2/9 03/03/2021 08/26/2020 02/24/2020  Decreased Interest 0 0 0  Down, Depressed, Hopeless 0 0 0  PHQ - 2 Score 0 0 0  Altered sleeping 1 3 1   Tired, decreased energy 0 0 0  Change in appetite 0 0 0  Feeling bad or failure about yourself  0 0 0  Trouble concentrating 0 0 0  Moving slowly or fidgety/restless 1 0 0  Suicidal thoughts 0 - 0  PHQ-9 Score 2 3 1   Difficult doing work/chores Not difficult at all Not difficult at all Not difficult at all  A/P: full remission- continue current meds   #Intermittent anemia-gives double reds in past- now just platelets-monitor CBC today Lab Results  Component Value Date   WBC 6.8 02/24/2020   HGB 13.4 02/24/2020   HCT 40.9 02/24/2020   MCV 87.0 02/24/2020   PLT 259.0 02/24/2020   Recommended follow up: Return in about 6 months (around 09/03/2021) for follow up- or sooner if needed.  Lab/Order associations: fasting   ICD-10-CM   1. Preventative health care  Z00.00   2. Hyperlipidemia associated with type 2 diabetes mellitus ([place])  E11.69    E78.5   3. Controlled type 2 diabetes mellitus without complication, without long-term current use of insulin (HCC)  E11.9   4. Screening for prostate cancer  Z12.5   5. Aortic atherosclerosis (HCC)  I70.0   6. Myalgia due to statin  M79.10    T46.6X5A   7. Major depressive disorder with single episode, in full remission ([place]) Chronic F32.5     No orders of the  defined types were placed in this encounter.   Return precautions advised.  Garret Reddish, MD

## 2021-03-02 NOTE — Patient Instructions (Addendum)
Please stop by lab before you go If you have mychart- we will send your results within 3 business days of [place] receiving them.  If you do not have mychart- we will call you about results within 5 business days of [place] receiving them.  *please also note that you will see labs on mychart as soon as they post. I will later go in and write notes on them- will say "notes from Dr. Yong Channel"  Health Maintenance Due  Topic Date Due  . PNA vac Low Risk Adult- consider prevnar 20 in future visit 02/03/2021  . OPHTHALMOLOGY EXAM Scheduled for 03/17/2021. Please have them send [place] a copy 02/15/2021   Recommended follow up: Return in about 6 months (around 09/03/2021) for follow up- or sooner if needed.

## 2021-03-03 ENCOUNTER — Encounter: Payer: Self-pay | Admitting: Family Medicine

## 2021-03-03 ENCOUNTER — Other Ambulatory Visit: Payer: Self-pay

## 2021-03-03 ENCOUNTER — Ambulatory Visit (INDEPENDENT_AMBULATORY_CARE_PROVIDER_SITE_OTHER): Payer: 59 | Admitting: Family Medicine

## 2021-03-03 VITALS — BP 120/86 | HR 80 | Temp 97.8°F | Ht 67.0 in | Wt 174.6 lb

## 2021-03-03 DIAGNOSIS — E785 Hyperlipidemia, unspecified: Secondary | ICD-10-CM

## 2021-03-03 DIAGNOSIS — F325 Major depressive disorder, single episode, in full remission: Secondary | ICD-10-CM

## 2021-03-03 DIAGNOSIS — Z0000 Encounter for general adult medical examination without abnormal findings: Secondary | ICD-10-CM

## 2021-03-03 DIAGNOSIS — Z125 Encounter for screening for malignant neoplasm of prostate: Secondary | ICD-10-CM

## 2021-03-03 DIAGNOSIS — M791 Myalgia, unspecified site: Secondary | ICD-10-CM | POA: Diagnosis not present

## 2021-03-03 DIAGNOSIS — I700 Atherosclerosis of aorta: Secondary | ICD-10-CM

## 2021-03-03 DIAGNOSIS — E1169 Type 2 diabetes mellitus with other specified complication: Secondary | ICD-10-CM

## 2021-03-03 DIAGNOSIS — E119 Type 2 diabetes mellitus without complications: Secondary | ICD-10-CM | POA: Diagnosis not present

## 2021-03-03 DIAGNOSIS — T466X5A Adverse effect of antihyperlipidemic and antiarteriosclerotic drugs, initial encounter: Secondary | ICD-10-CM

## 2021-03-03 LAB — CBC WITH DIFFERENTIAL/PLATELET
Basophils Absolute: 0 10*3/uL (ref 0.0–0.1)
Basophils Relative: 0.6 % (ref 0.0–3.0)
Eosinophils Absolute: 0.1 10*3/uL (ref 0.0–0.7)
Eosinophils Relative: 2.3 % (ref 0.0–5.0)
HCT: 43.1 % (ref 39.0–52.0)
Hemoglobin: 14.2 g/dL (ref 13.0–17.0)
Lymphocytes Relative: 29.2 % (ref 12.0–46.0)
Lymphs Abs: 1.8 10*3/uL (ref 0.7–4.0)
MCHC: 32.8 g/dL (ref 30.0–36.0)
MCV: 86.7 fl (ref 78.0–100.0)
Monocytes Absolute: 0.5 10*3/uL (ref 0.1–1.0)
Monocytes Relative: 8.6 % (ref 3.0–12.0)
Neutro Abs: 3.7 10*3/uL (ref 1.4–7.7)
Neutrophils Relative %: 59.3 % (ref 43.0–77.0)
Platelets: 293 10*3/uL (ref 150.0–400.0)
RBC: 4.97 Mil/uL (ref 4.22–5.81)
RDW: 13.6 % (ref 11.5–15.5)
WBC: 6.3 10*3/uL (ref 4.0–10.5)

## 2021-03-03 LAB — COMPREHENSIVE METABOLIC PANEL
ALT: 21 U/L (ref 0–53)
AST: 24 U/L (ref 0–37)
Albumin: 4.1 g/dL (ref 3.5–5.2)
Alkaline Phosphatase: 84 U/L (ref 39–117)
BUN: 22 mg/dL (ref 6–23)
CO2: 30 mEq/L (ref 19–32)
Calcium: 9.3 mg/dL (ref 8.4–10.5)
Chloride: 100 mEq/L (ref 96–112)
Creatinine, Ser: 1.13 mg/dL (ref 0.40–1.50)
GFR: 68.42 mL/min (ref >60.00)
Glucose, Bld: 97 mg/dL (ref 70–99)
Potassium: 4.4 mEq/L (ref 3.5–5.1)
Sodium: 137 mEq/L (ref 135–145)
Total Bilirubin: 0.9 mg/dL (ref 0.2–1.2)
Total Protein: 6.7 g/dL (ref 6.0–8.3)

## 2021-03-03 LAB — MICROALBUMIN / CREATININE URINE RATIO
Creatinine,U: 195.4 mg/dL
Microalb Creat Ratio: 0.8 mg/g (ref 0.0–30.0)
Microalb, Ur: 1.5 mg/dL (ref 0.0–1.9)

## 2021-03-03 LAB — POC URINALSYSI DIPSTICK (AUTOMATED)
Bilirubin, UA: NEGATIVE
Blood, UA: NEGATIVE
Glucose, UA: NEGATIVE
Ketones, UA: NEGATIVE
Leukocytes, UA: NEGATIVE
Nitrite, UA: NEGATIVE
Protein, UA: NEGATIVE
Spec Grav, UA: 1.02 (ref 1.010–1.025)
Urobilinogen, UA: 0.2 E.U./dL
pH, UA: 6 (ref 5.0–8.0)

## 2021-03-03 LAB — LIPID PANEL
Cholesterol: 210 mg/dL — ABNORMAL HIGH (ref 0–200)
HDL: 59.2 mg/dL (ref >39.00)
LDL Cholesterol: 132 mg/dL — ABNORMAL HIGH (ref 0–99)
NonHDL: 150.84
Total CHOL/HDL Ratio: 4
Triglycerides: 93 mg/dL (ref 0.0–149.0)
VLDL: 18.6 mg/dL (ref 0.0–40.0)

## 2021-03-03 LAB — HEMOGLOBIN A1C: Hgb A1c MFr Bld: 6.1 % (ref 4.6–6.5)

## 2021-03-03 LAB — PSA: PSA: 0.82 ng/mL (ref 0.10–4.00)

## 2021-03-03 MED ORDER — DULOXETINE HCL 60 MG PO CPEP: ORAL_CAPSULE | ORAL | 3 refills | Status: AC

## 2021-03-03 MED ORDER — DIAZEPAM 5 MG PO TABS: 5.0000 mg | ORAL_TABLET | Freq: Two times a day (BID) | ORAL | 5 refills | Status: DC | PRN

## 2021-03-03 NOTE — Addendum Note (Signed)
Addended by: Brandy Hale on: 03/03/2021 10:03 AM   Modules accepted: Orders

## 2021-03-04 ENCOUNTER — Other Ambulatory Visit: Payer: Self-pay

## 2021-03-04 ENCOUNTER — Encounter: Payer: Self-pay | Admitting: Family Medicine

## 2021-03-04 MED ORDER — ROSUVASTATIN CALCIUM 10 MG PO TABS: 10.0000 mg | ORAL_TABLET | ORAL | 2 refills | Status: AC

## 2021-03-17 LAB — HM DIABETES EYE EXAM

## 2021-03-30 ENCOUNTER — Encounter: Payer: Self-pay | Admitting: Family Medicine

## 2021-06-23 ENCOUNTER — Encounter: Payer: Self-pay | Admitting: Family Medicine

## 2021-06-23 NOTE — Telephone Encounter (Signed)
Spoke with patient, patient will keep appointment on September. Will request medical records when establish with new PCP in [place] area

## 2021-09-07 ENCOUNTER — Ambulatory Visit: Payer: 59 | Admitting: Family Medicine

## 2021-09-07 ENCOUNTER — Other Ambulatory Visit: Payer: Self-pay | Admitting: Family Medicine

## 2021-09-20 NOTE — Patient Instructions (Addendum)
Health Maintenance Due  Topic Date Due   INFLUENZA VACCINE   - high dose in office today  07/26/2021   HEMOGLOBIN A1C   -  Please stop by lab before you go If you have mychart- we will send your results within 3 business days of [place] receiving them.  If you do not have mychart- we will call you about results within 5 business days of [place] receiving them.  *please also note that you will see labs on mychart as soon as they post. I will later go in and write notes on them- will say "notes from Dr. Yong Channel"  09/03/2021    I love that you walk/run everyday! Please keep up the great work!  Recommended follow up: He will be seen with new MD in [place], [place] for CPE in january

## 2021-09-20 NOTE — Progress Notes (Signed)
Phone [phone] In person visit   Subjective:   Isaiah Dunn is a 65 y.o. year old very pleasant male patient who presents for/with See problem oriented charting Chief Complaint  Patient presents with   Diabetes   Hyperlipidemia   Depression   This visit occurred during the SARS-CoV-2 public health emergency.  Safety protocols were in place, including screening questions prior to the visit, additional usage of staff PPE, and extensive cleaning of exam room while observing appropriate contact time as indicated for disinfecting solutions.   Past Medical History-  Patient Active Problem List   Diagnosis Date Noted   Diabetes mellitus type II, controlled ([place]) 05/11/2007    Priority: 1.   Aortic atherosclerosis ([place]) 03/03/2021    Priority: 2.   Myalgia due to statin 03/03/2021    Priority: 2.   History of adenomatous polyp of colon 02/19/2018    Priority: 2.   BPH associated with nocturia 01/12/2018    Priority: 2.   Retinal tear 03/17/2015    Priority: 2.   Major depression in full remission ([place]) 10/06/2014    Priority: 2.   Anemia 10/06/2014    Priority: 2.   Diastolic dysfunction 40/07/6760    Priority: 2.   Essential tremor 09/06/2007    Priority: 2.   Hyperlipidemia associated with type 2 diabetes mellitus ([place]) 05/11/2007    Priority: 2.   RUQ pain 01/07/2015    Priority: 3.   UTI (urinary tract infection) 11/11/2013    Priority: 3.   History of herpes zoster 07/17/2009    Priority: 3.   Osteoarthritis 04/16/2008    Priority: 3.    Medications- reviewed and updated Current Outpatient Medications  Medication Sig Dispense Refill   b complex vitamins capsule Take 1 capsule by mouth daily.     cholecalciferol (VITAMIN D3) 25 MCG (1000 UNIT) tablet      DULoxetine (CYMBALTA) 60 MG capsule TAKE 1 CAPSULE BY MOUTH EVERY DAY 90 capsule 3   LUTEIN PO Take 40 mg by mouth. Daily     MAGNESIUM CITRATE PO Take 250 mg by mouth daily.     predniSONE  (DELTASONE) 20 MG tablet Take 2 pills for 3 days, 1 pill for 4 days 10 tablet 0   rosuvastatin (CRESTOR) 10 MG tablet Take 1 tablet (10 mg total) by mouth once a week. 13 tablet 2   diazepam (VALIUM) 5 MG tablet TAKE ONE TABLET BY MOUTH EVERY 12 HOURS AS NEEDED FOR ESSENTIAL TREMOR.  DO NOT DRIVE FOR 8 HOURS AFTER USE 30 tablet 5   No current facility-administered medications for this visit.     Objective:  BP 116/82   Pulse 69   Temp 97.9 F (36.6 C) (Temporal)   Ht 5\' 7"  (1.702 m)   Wt 176 lb 6.4 oz (80 kg)   SpO2 98%   BMI 27.63 kg/m  Gen: NAD, resting comfortably CV: RRR no murmurs rubs or gallops Lungs: CTAB no crackles, wheeze, rhonchi Abdomen: soft/nontender/nondistended/normal bowel sounds. No rebound or guarding.  Ext: no edema Skin: warm, dry, healing stings on left upper arm    Assessment and Plan   #Social update-Annie GF retired from [employer](recovering from endometrial cancer- has done well with treatment) He is working remotely until next fall. Walking/running daily.    #Diabetes S: Diet controlled with weight loss in 2021.  Previously on  with metformin 500 mg extended release once a day.  On benazepril for renal protection in the past but  orthostatic so stopped. Exercise and diet- excellent exercise as above- 50-60 minutes  Lab Results  Component Value Date   HGBA1C 6.1 03/03/2021   HGBA1C 5.8 (H) 08/26/2020   HGBA1C 6.0 02/24/2020   A/P: hopefully stable- update a1c today. Continue current meds for now  #Hyperlipidemia S: Compliant with rosuvastatin 10 mg once a week- slightly better with myalgias- does get some muscle fatigue -Lovastatin caused myalgias -Atorvastatin caused myalgias Lab Results  Component Value Date   CHOL 210 (H) 03/03/2021   HDL 59.20 03/03/2021   LDLCALC 132 (H) 03/03/2021   LDLDIRECT 74 03/07/2014   TRIG 93.0 03/03/2021   CHOLHDL 4 03/03/2021   A/P: Hopefully remains close to goal with switch-update direct LDL with labs  today    #Essential tremor S: Valium 5 mg as needed-been only medication that has given him relief.  Multiple medication trials in the past. seen neurology in the past. - slight worsening in left hand, still has in the right mainly A/P: largely stable- continue current meds   #Major depression in full remission S: Patient doing well on Cymbalta 60 mg daily.   Depression screen [facility] 2/9 09/24/2021 03/03/2021 08/26/2020  Decreased Interest 0 0 0  Down, Depressed, Hopeless 0 0 0  PHQ - 2 Score 0 0 0  Altered sleeping 0 1 3  Tired, decreased energy 0 0 0  Change in appetite 0 0 0  Feeling bad or failure about yourself  0 0 0  Trouble concentrating 0 0 0  Moving slowly or fidgety/restless 0 1 0  Suicidal thoughts 0 0 -  PHQ-9 Score 0 2 3  Difficult doing work/chores Not difficult at all Not difficult at all Not difficult at all  A/P: full remission- continue current meds  #Severe local reaction-patient with several stings on left arm about 2 weeks ago-had robust response with swelling significantly of the left upper arm-he used Benadryl and Clariti but still remained rather large-we will send in a dose of prednisone to have on hand particularly with him being in [place] now-he does not need this at this time though.  Recommended follow up: he will be seeing new MD in [place] in January for CPE- we will certainly miss him  Lab/Order associations:   ICD-10-CM   1. Controlled type 2 diabetes mellitus without complication, without long-term current use of insulin (HCC)  E11.9 LDL cholesterol, direct    HgB A1c    Comprehensive metabolic panel    2. Hyperlipidemia associated with type 2 diabetes mellitus ([place])  E11.69    E78.5     3. Essential tremor  G25.0     4. Major depressive disorder with single episode, in full remission ([place])  F32.5     5. Need for immunization against influenza  Z23 Flu Vaccine QUAD High Dose([place])     Meds ordered this encounter  Medications    diazepam (VALIUM) 5 MG tablet    Sig: TAKE ONE TABLET BY MOUTH EVERY 12 HOURS AS NEEDED FOR ESSENTIAL TREMOR.  DO NOT DRIVE FOR 8 HOURS AFTER USE    Dispense:  30 tablet    Refill:  5   predniSONE (DELTASONE) 20 MG tablet    Sig: Take 2 pills for 3 days, 1 pill for 4 days    Dispense:  10 tablet    Refill:  0   I,Jada Bradford,acting as a scribe for Garret Reddish, MD.,have documented all relevant documentation on the behalf of Garret Reddish, MD,as directed by  Garret Reddish, MD while in the presence of Garret Reddish, MD.  I, Garret Reddish, MD, have reviewed all documentation for this visit. The documentation on 09/24/21 for the exam, diagnosis, procedures, and orders are all accurate and complete.  Return precautions advised.  Garret Reddish, MD

## 2021-09-24 ENCOUNTER — Ambulatory Visit: Payer: 59 | Admitting: Family Medicine

## 2021-09-24 ENCOUNTER — Encounter: Payer: Self-pay | Admitting: Family Medicine

## 2021-09-24 ENCOUNTER — Other Ambulatory Visit: Payer: Self-pay

## 2021-09-24 VITALS — BP 116/82 | HR 69 | Temp 97.9°F | Ht 67.0 in | Wt 176.4 lb

## 2021-09-24 DIAGNOSIS — E1169 Type 2 diabetes mellitus with other specified complication: Secondary | ICD-10-CM | POA: Diagnosis not present

## 2021-09-24 DIAGNOSIS — G250 Essential tremor: Secondary | ICD-10-CM | POA: Diagnosis not present

## 2021-09-24 DIAGNOSIS — Z23 Encounter for immunization: Secondary | ICD-10-CM

## 2021-09-24 DIAGNOSIS — F325 Major depressive disorder, single episode, in full remission: Secondary | ICD-10-CM

## 2021-09-24 DIAGNOSIS — E119 Type 2 diabetes mellitus without complications: Secondary | ICD-10-CM

## 2021-09-24 DIAGNOSIS — E785 Hyperlipidemia, unspecified: Secondary | ICD-10-CM

## 2021-09-24 LAB — COMPREHENSIVE METABOLIC PANEL
ALT: 24 U/L (ref 0–53)
AST: 29 U/L (ref 0–37)
Albumin: 4 g/dL (ref 3.5–5.2)
Alkaline Phosphatase: 86 U/L (ref 39–117)
BUN: 22 mg/dL (ref 6–23)
CO2: 29 mEq/L (ref 19–32)
Calcium: 9 mg/dL (ref 8.4–10.5)
Chloride: 102 mEq/L (ref 96–112)
Creatinine, Ser: 1.02 mg/dL (ref 0.40–1.50)
GFR: 77.06 mL/min (ref >60.00)
Glucose, Bld: 113 mg/dL — ABNORMAL HIGH (ref 70–99)
Potassium: 4.3 mEq/L (ref 3.5–5.1)
Sodium: 138 mEq/L (ref 135–145)
Total Bilirubin: 0.8 mg/dL (ref 0.2–1.2)
Total Protein: 6.7 g/dL (ref 6.0–8.3)

## 2021-09-24 LAB — HEMOGLOBIN A1C: Hgb A1c MFr Bld: 6.3 % (ref 4.6–6.5)

## 2021-09-24 LAB — LDL CHOLESTEROL, DIRECT: Direct LDL: 83 mg/dL

## 2021-09-24 MED ORDER — DIAZEPAM 5 MG PO TABS: ORAL_TABLET | ORAL | 5 refills | Status: AC

## 2021-09-24 MED ORDER — PREDNISONE 20 MG PO TABS: ORAL_TABLET | ORAL | 0 refills | Status: AC

## 2023-01-31 DIAGNOSIS — I700 Atherosclerosis of aorta: Secondary | ICD-10-CM | POA: Diagnosis not present

## 2023-01-31 DIAGNOSIS — E1169 Type 2 diabetes mellitus with other specified complication: Secondary | ICD-10-CM | POA: Diagnosis not present

## 2023-01-31 DIAGNOSIS — G250 Essential tremor: Secondary | ICD-10-CM | POA: Diagnosis not present

## 2023-01-31 DIAGNOSIS — Z13220 Encounter for screening for lipoid disorders: Secondary | ICD-10-CM | POA: Diagnosis not present

## 2023-01-31 DIAGNOSIS — E119 Type 2 diabetes mellitus without complications: Secondary | ICD-10-CM | POA: Diagnosis not present

## 2023-01-31 DIAGNOSIS — Z1211 Encounter for screening for malignant neoplasm of colon: Secondary | ICD-10-CM | POA: Diagnosis not present

## 2023-01-31 DIAGNOSIS — E785 Hyperlipidemia, unspecified: Secondary | ICD-10-CM | POA: Diagnosis not present

## 2023-02-13 DIAGNOSIS — D649 Anemia, unspecified: Secondary | ICD-10-CM | POA: Diagnosis not present

## 2023-02-13 DIAGNOSIS — Z1211 Encounter for screening for malignant neoplasm of colon: Secondary | ICD-10-CM | POA: Diagnosis not present

## 2023-02-13 DIAGNOSIS — R109 Unspecified abdominal pain: Secondary | ICD-10-CM | POA: Diagnosis not present

## 2023-02-13 DIAGNOSIS — E1169 Type 2 diabetes mellitus with other specified complication: Secondary | ICD-10-CM | POA: Diagnosis not present

## 2023-02-13 DIAGNOSIS — R5383 Other fatigue: Secondary | ICD-10-CM | POA: Diagnosis not present

## 2023-02-13 DIAGNOSIS — Z79899 Other long term (current) drug therapy: Secondary | ICD-10-CM | POA: Diagnosis not present

## 2023-02-13 DIAGNOSIS — I700 Atherosclerosis of aorta: Secondary | ICD-10-CM | POA: Diagnosis not present

## 2023-02-13 DIAGNOSIS — E785 Hyperlipidemia, unspecified: Secondary | ICD-10-CM | POA: Diagnosis not present

## 2023-02-13 DIAGNOSIS — E538 Deficiency of other specified B group vitamins: Secondary | ICD-10-CM | POA: Diagnosis not present

## 2023-02-13 DIAGNOSIS — G250 Essential tremor: Secondary | ICD-10-CM | POA: Diagnosis not present

## 2023-02-21 DIAGNOSIS — R109 Unspecified abdominal pain: Secondary | ICD-10-CM | POA: Diagnosis not present

## 2023-02-23 DIAGNOSIS — K635 Polyp of colon: Secondary | ICD-10-CM | POA: Diagnosis not present

## 2023-02-23 DIAGNOSIS — Z86010 Personal history of colonic polyps: Secondary | ICD-10-CM | POA: Diagnosis not present

## 2023-02-23 DIAGNOSIS — Z09 Encounter for follow-up examination after completed treatment for conditions other than malignant neoplasm: Secondary | ICD-10-CM | POA: Diagnosis not present

## 2023-02-27 DIAGNOSIS — H43812 Vitreous degeneration, left eye: Secondary | ICD-10-CM | POA: Diagnosis not present

## 2023-02-27 DIAGNOSIS — E119 Type 2 diabetes mellitus without complications: Secondary | ICD-10-CM | POA: Diagnosis not present

## 2023-02-27 DIAGNOSIS — Z961 Presence of intraocular lens: Secondary | ICD-10-CM | POA: Diagnosis not present

## 2023-02-27 DIAGNOSIS — H35371 Puckering of macula, right eye: Secondary | ICD-10-CM | POA: Diagnosis not present

## 2023-03-10 DIAGNOSIS — Z1329 Encounter for screening for other suspected endocrine disorder: Secondary | ICD-10-CM | POA: Diagnosis not present

## 2023-03-15 DIAGNOSIS — K219 Gastro-esophageal reflux disease without esophagitis: Secondary | ICD-10-CM | POA: Diagnosis not present

## 2023-03-15 DIAGNOSIS — E119 Type 2 diabetes mellitus without complications: Secondary | ICD-10-CM | POA: Diagnosis not present

## 2023-03-15 DIAGNOSIS — K209 Esophagitis, unspecified without bleeding: Secondary | ICD-10-CM | POA: Diagnosis not present

## 2023-03-15 DIAGNOSIS — E1169 Type 2 diabetes mellitus with other specified complication: Secondary | ICD-10-CM | POA: Diagnosis not present

## 2023-03-15 DIAGNOSIS — D62 Acute posthemorrhagic anemia: Secondary | ICD-10-CM | POA: Diagnosis not present

## 2023-03-15 DIAGNOSIS — K8500 Idiopathic acute pancreatitis without necrosis or infection: Secondary | ICD-10-CM | POA: Diagnosis not present

## 2023-03-21 DIAGNOSIS — M2550 Pain in unspecified joint: Secondary | ICD-10-CM | POA: Diagnosis not present

## 2023-03-21 DIAGNOSIS — R5383 Other fatigue: Secondary | ICD-10-CM | POA: Diagnosis not present

## 2023-03-31 ENCOUNTER — Encounter: Payer: Self-pay | Admitting: Gastroenterology

## 2023-03-31 DIAGNOSIS — R109 Unspecified abdominal pain: Secondary | ICD-10-CM | POA: Diagnosis not present

## 2023-03-31 DIAGNOSIS — N281 Cyst of kidney, acquired: Secondary | ICD-10-CM | POA: Diagnosis not present

## 2023-04-26 DIAGNOSIS — M2550 Pain in unspecified joint: Secondary | ICD-10-CM | POA: Diagnosis not present

## 2023-04-26 DIAGNOSIS — R5383 Other fatigue: Secondary | ICD-10-CM | POA: Diagnosis not present

## 2023-04-28 DIAGNOSIS — E119 Type 2 diabetes mellitus without complications: Secondary | ICD-10-CM | POA: Diagnosis not present

## 2023-04-28 DIAGNOSIS — Z79899 Other long term (current) drug therapy: Secondary | ICD-10-CM | POA: Diagnosis not present

## 2023-04-28 DIAGNOSIS — E785 Hyperlipidemia, unspecified: Secondary | ICD-10-CM | POA: Diagnosis not present

## 2023-04-28 DIAGNOSIS — E1169 Type 2 diabetes mellitus with other specified complication: Secondary | ICD-10-CM | POA: Diagnosis not present

## 2023-05-05 DIAGNOSIS — Z1211 Encounter for screening for malignant neoplasm of colon: Secondary | ICD-10-CM | POA: Diagnosis not present

## 2023-05-05 DIAGNOSIS — Z79899 Other long term (current) drug therapy: Secondary | ICD-10-CM | POA: Diagnosis not present

## 2023-05-05 DIAGNOSIS — I700 Atherosclerosis of aorta: Secondary | ICD-10-CM | POA: Diagnosis not present

## 2023-05-05 DIAGNOSIS — G250 Essential tremor: Secondary | ICD-10-CM | POA: Diagnosis not present

## 2023-05-05 DIAGNOSIS — E785 Hyperlipidemia, unspecified: Secondary | ICD-10-CM | POA: Diagnosis not present

## 2023-05-05 DIAGNOSIS — Z0000 Encounter for general adult medical examination without abnormal findings: Secondary | ICD-10-CM | POA: Diagnosis not present

## 2023-05-05 DIAGNOSIS — E1169 Type 2 diabetes mellitus with other specified complication: Secondary | ICD-10-CM | POA: Diagnosis not present
